# Patient Record
Sex: Female | Born: 1980 | Race: Black or African American | Hispanic: No | Marital: Married | Smoking: Never smoker
Health system: Southern US, Community
[De-identification: ages and names within clinical notes are randomized; demographics above are authoritative.]

## PROBLEM LIST (undated history)

## (undated) ENCOUNTER — Inpatient Hospital Stay (HOSPITAL_COMMUNITY): Payer: Self-pay

## (undated) DIAGNOSIS — M329 Systemic lupus erythematosus, unspecified: Secondary | ICD-10-CM

## (undated) DIAGNOSIS — D649 Anemia, unspecified: Secondary | ICD-10-CM

## (undated) DIAGNOSIS — G43909 Migraine, unspecified, not intractable, without status migrainosus: Secondary | ICD-10-CM

## (undated) DIAGNOSIS — A879 Viral meningitis, unspecified: Secondary | ICD-10-CM

## (undated) DIAGNOSIS — D509 Iron deficiency anemia, unspecified: Secondary | ICD-10-CM

## (undated) DIAGNOSIS — N921 Excessive and frequent menstruation with irregular cycle: Secondary | ICD-10-CM

## (undated) HISTORY — DX: Iron deficiency anemia, unspecified: D50.9

## (undated) HISTORY — DX: Excessive and frequent menstruation with irregular cycle: N92.1

---
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
## 2003-05-21 ENCOUNTER — Inpatient Hospital Stay (HOSPITAL_COMMUNITY): Admission: AD | Admit: 2003-05-21 | Discharge: 2003-05-21 | Payer: Self-pay | Admitting: Obstetrics and Gynecology

## 2003-10-03 ENCOUNTER — Inpatient Hospital Stay (HOSPITAL_COMMUNITY): Admission: EM | Admit: 2003-10-03 | Discharge: 2003-10-05 | Payer: Self-pay | Admitting: Emergency Medicine

## 2004-01-29 ENCOUNTER — Inpatient Hospital Stay (HOSPITAL_COMMUNITY): Admission: AD | Admit: 2004-01-29 | Discharge: 2004-01-30 | Payer: Self-pay | Admitting: Obstetrics & Gynecology

## 2005-03-12 ENCOUNTER — Emergency Department (HOSPITAL_COMMUNITY): Admission: EM | Admit: 2005-03-12 | Discharge: 2005-03-12 | Payer: Self-pay | Admitting: Emergency Medicine

## 2005-04-18 ENCOUNTER — Other Ambulatory Visit: Admission: RE | Admit: 2005-04-18 | Discharge: 2005-04-18 | Payer: Self-pay | Admitting: Obstetrics and Gynecology

## 2005-05-10 ENCOUNTER — Inpatient Hospital Stay (HOSPITAL_COMMUNITY): Admission: EM | Admit: 2005-05-10 | Discharge: 2005-05-13 | Payer: Self-pay | Admitting: Emergency Medicine

## 2005-05-17 ENCOUNTER — Emergency Department (HOSPITAL_COMMUNITY): Admission: EM | Admit: 2005-05-17 | Discharge: 2005-05-18 | Payer: Self-pay | Admitting: Emergency Medicine

## 2005-05-18 ENCOUNTER — Inpatient Hospital Stay (HOSPITAL_COMMUNITY): Admission: AD | Admit: 2005-05-18 | Discharge: 2005-05-22 | Payer: Self-pay | Admitting: Internal Medicine

## 2005-05-18 ENCOUNTER — Ambulatory Visit: Payer: Self-pay | Admitting: Infectious Diseases

## 2005-05-18 ENCOUNTER — Encounter (INDEPENDENT_AMBULATORY_CARE_PROVIDER_SITE_OTHER): Payer: Self-pay | Admitting: *Deleted

## 2006-07-12 IMAGING — CR DG SHOULDER 2+V*L*
3 series · 3 of 3 positions shown · non-contrast
Comparison: None.

CLINICAL DATA: Painful medication line.
 LEFT SHOULDER - 3 VIEW:

[w shoulder ap internal left]
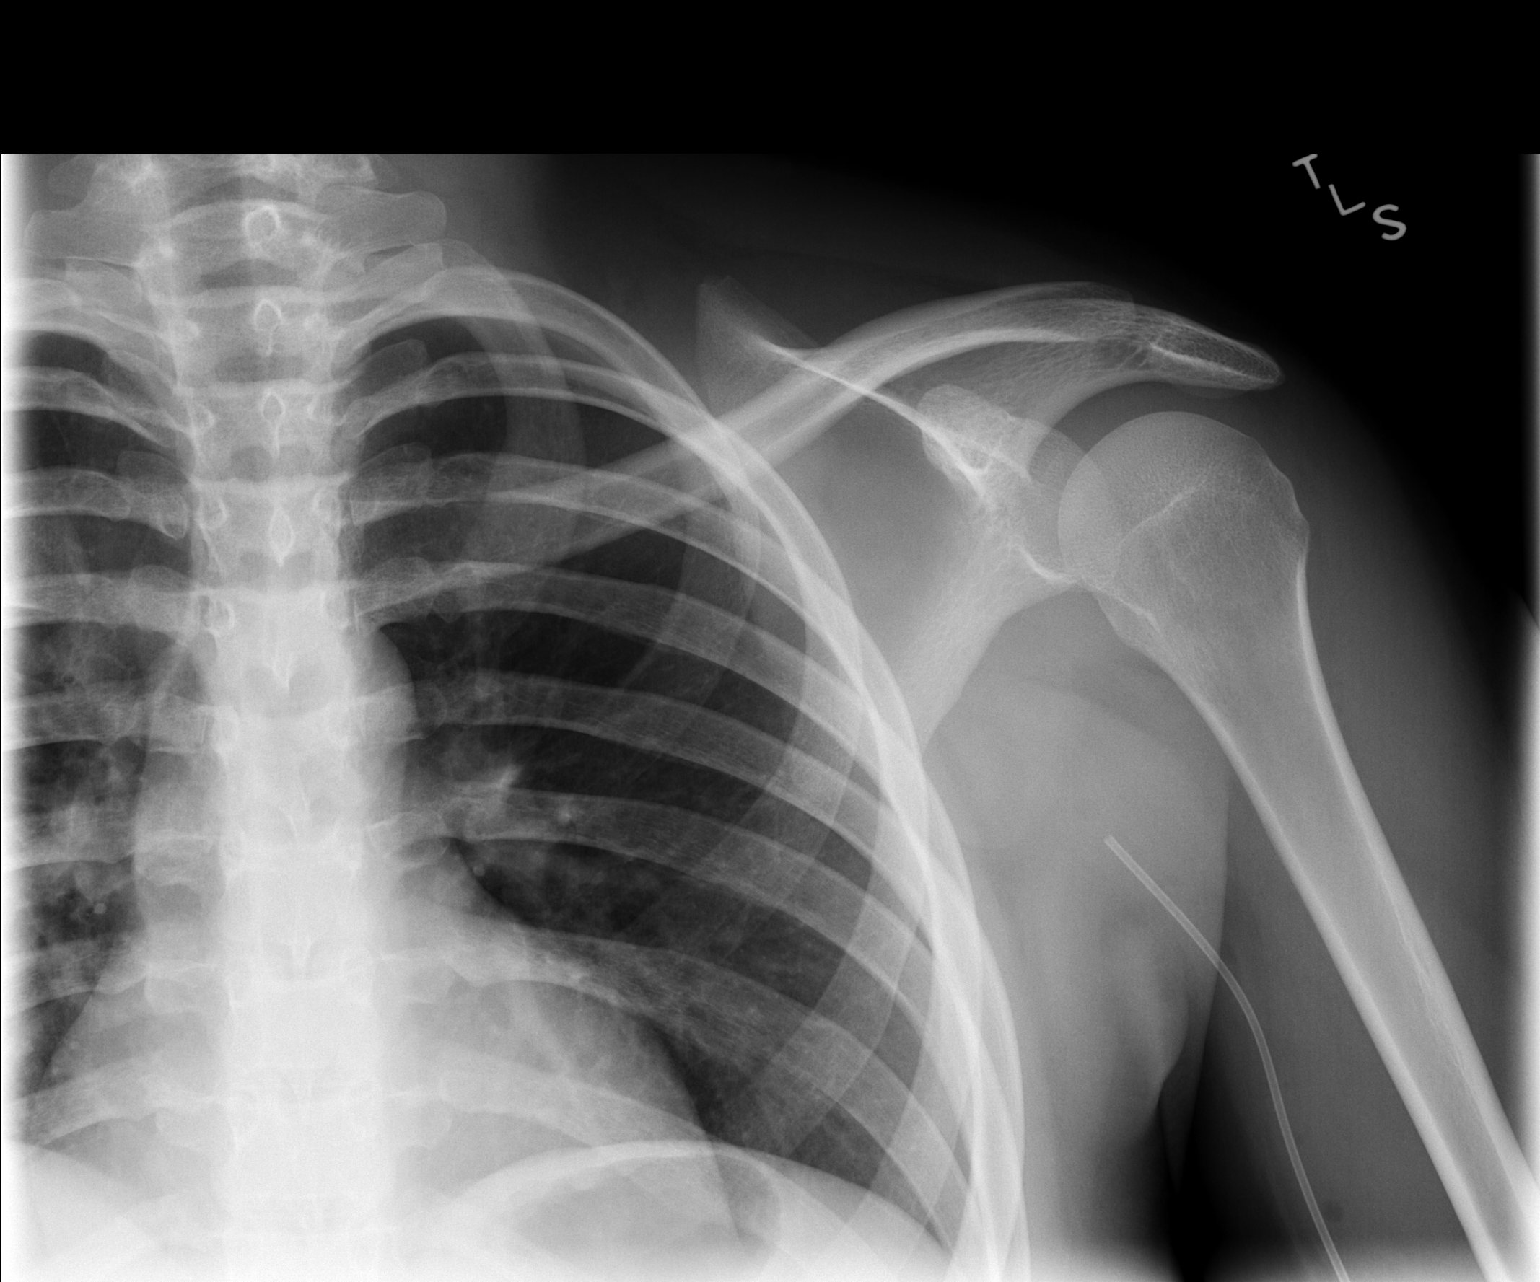

[w shoulder ap external left]
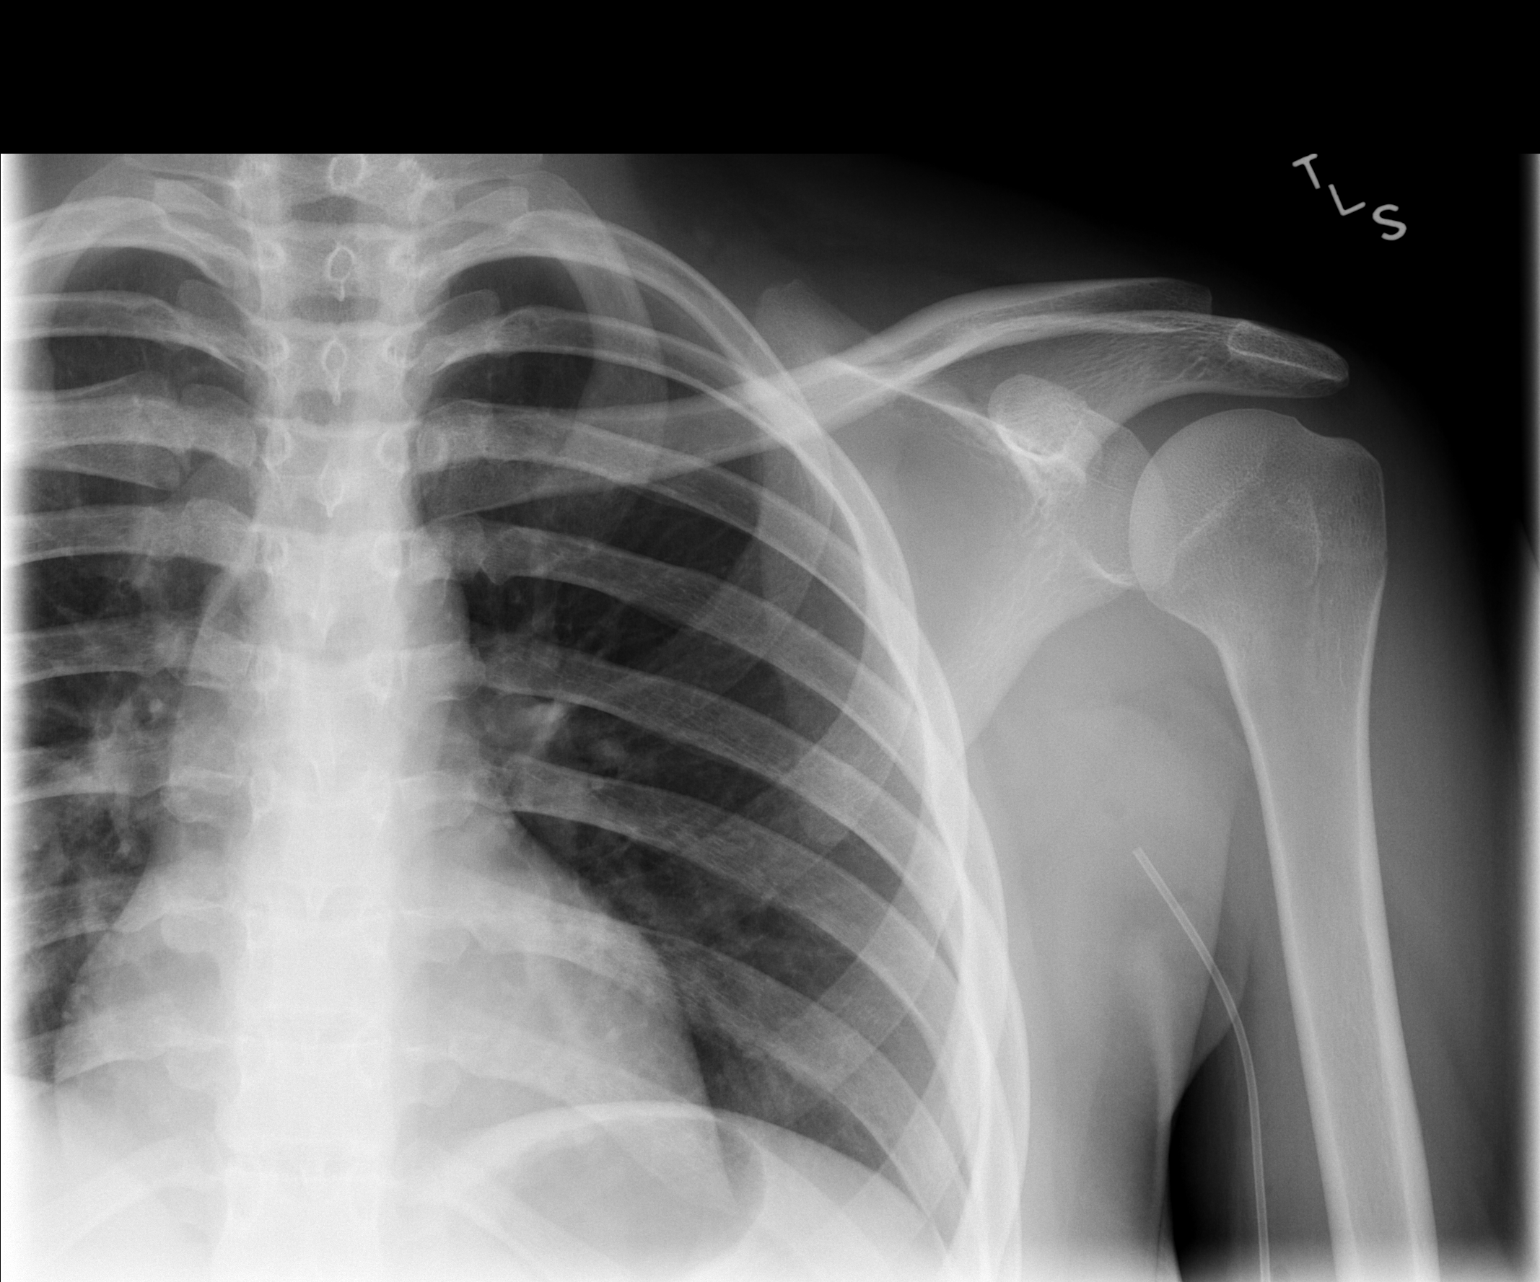

[w shoulder y view left]
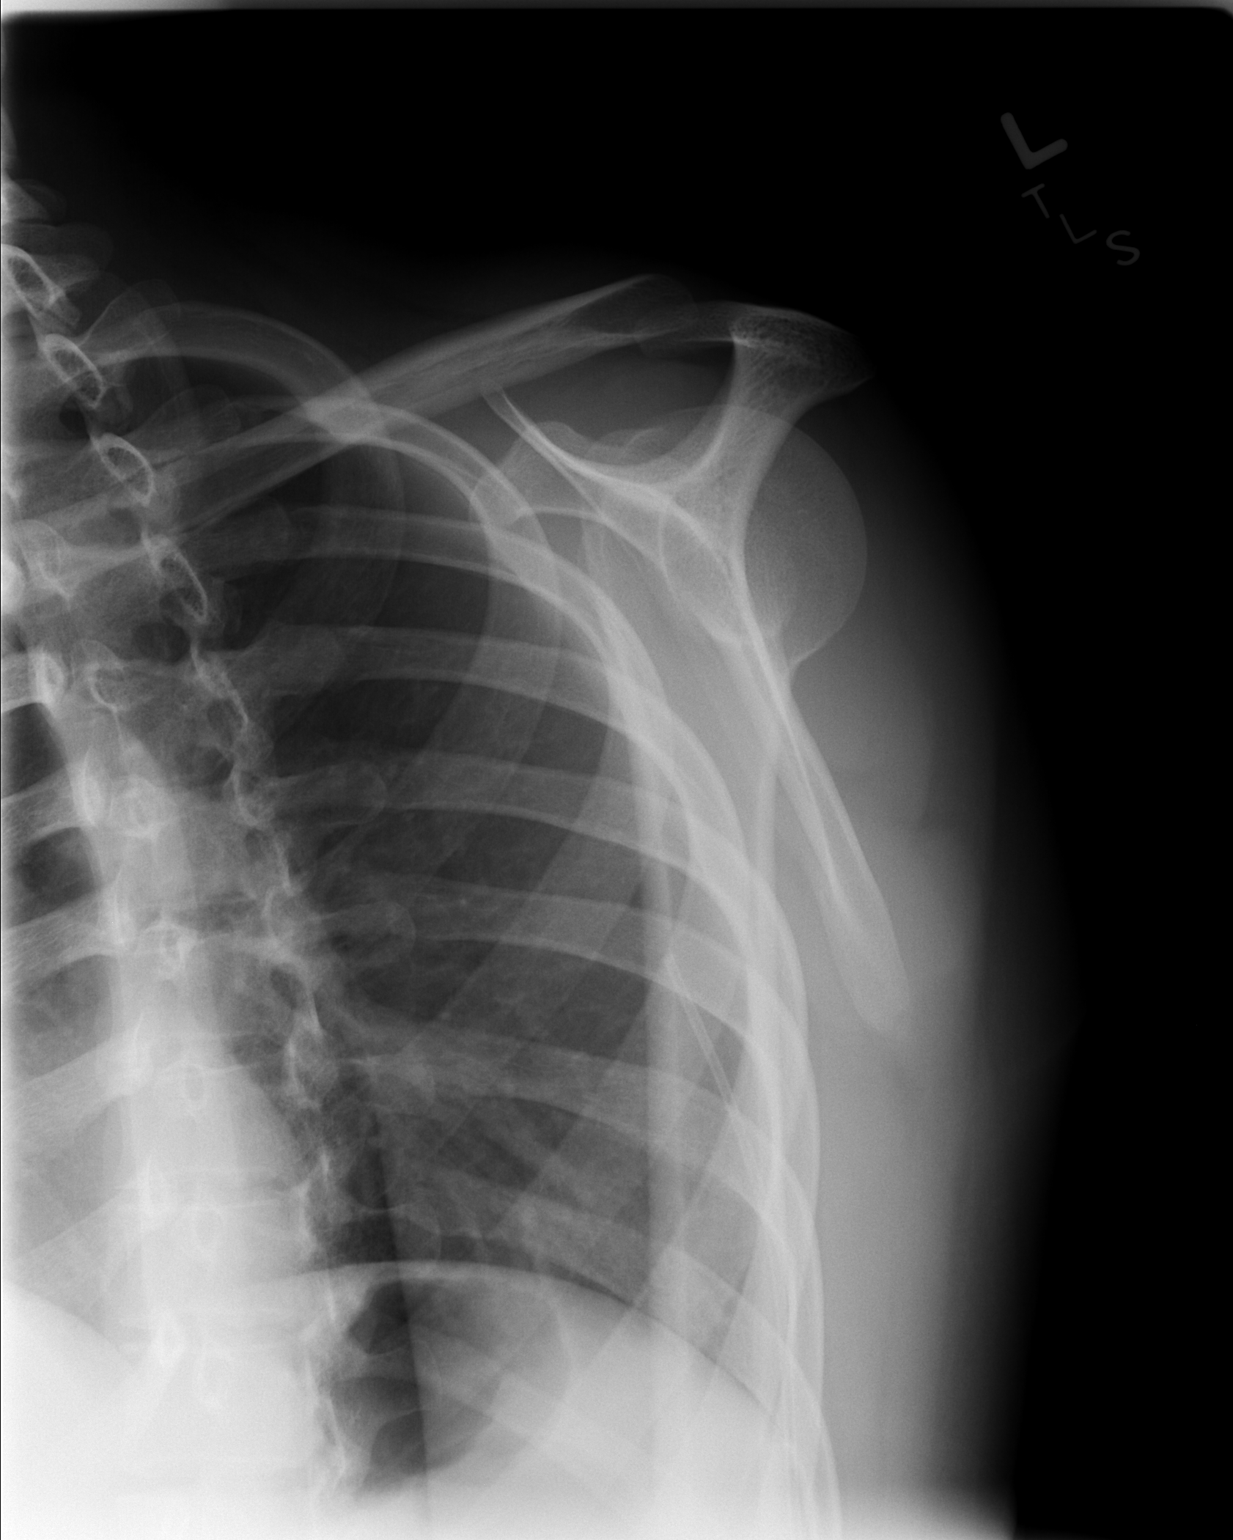

[3 of 3 positions shown; findings below may reference images not displayed]

FINDINGS: Osseous structures appear unremarkable.  Tubing extends along the left arm into the axilla and terminates in the axilla.  On the internal rotation view, there is a suggestion of a small possible locule of gas adjacent to the line.
IMPRESSION: Left-sided tubing extends up the arm and terminates in the axilla.  There may be a tiny locule of gas adjacent to the tubing on one of the images.

## 2006-12-24 ENCOUNTER — Inpatient Hospital Stay (HOSPITAL_COMMUNITY): Admission: EM | Admit: 2006-12-24 | Discharge: 2006-12-27 | Payer: Self-pay | Admitting: Emergency Medicine

## 2006-12-24 ENCOUNTER — Ambulatory Visit: Payer: Self-pay | Admitting: Infectious Diseases

## 2007-04-20 ENCOUNTER — Inpatient Hospital Stay (HOSPITAL_COMMUNITY): Admission: EM | Admit: 2007-04-20 | Discharge: 2007-04-24 | Payer: Self-pay | Admitting: Emergency Medicine

## 2007-04-20 ENCOUNTER — Ambulatory Visit: Payer: Self-pay | Admitting: Infectious Disease

## 2009-01-07 ENCOUNTER — Emergency Department (HOSPITAL_COMMUNITY): Admission: EM | Admit: 2009-01-07 | Discharge: 2009-01-07 | Payer: Self-pay | Admitting: Emergency Medicine

## 2009-06-03 ENCOUNTER — Inpatient Hospital Stay (HOSPITAL_COMMUNITY): Admission: AD | Admit: 2009-06-03 | Discharge: 2009-06-03 | Payer: Self-pay | Admitting: Obstetrics and Gynecology

## 2009-07-01 ENCOUNTER — Ambulatory Visit: Payer: Self-pay | Admitting: Obstetrics and Gynecology

## 2009-07-01 ENCOUNTER — Inpatient Hospital Stay (HOSPITAL_COMMUNITY): Admission: AD | Admit: 2009-07-01 | Discharge: 2009-07-01 | Payer: Self-pay | Admitting: Obstetrics and Gynecology

## 2009-07-27 ENCOUNTER — Inpatient Hospital Stay (HOSPITAL_COMMUNITY): Admission: AD | Admit: 2009-07-27 | Discharge: 2009-07-27 | Payer: Self-pay | Admitting: Obstetrics and Gynecology

## 2009-07-30 ENCOUNTER — Inpatient Hospital Stay (HOSPITAL_COMMUNITY): Admission: AD | Admit: 2009-07-30 | Discharge: 2009-08-01 | Payer: Self-pay | Admitting: Obstetrics & Gynecology

## 2009-12-30 ENCOUNTER — Emergency Department (HOSPITAL_COMMUNITY): Admission: EM | Admit: 2009-12-30 | Discharge: 2009-12-31 | Payer: Self-pay | Admitting: Emergency Medicine

## 2010-07-18 ENCOUNTER — Inpatient Hospital Stay (HOSPITAL_COMMUNITY)
Admission: AD | Admit: 2010-07-18 | Discharge: 2010-07-18 | Payer: Self-pay | Source: Home / Self Care | Attending: Obstetrics & Gynecology | Admitting: Obstetrics & Gynecology

## 2010-07-20 ENCOUNTER — Inpatient Hospital Stay (HOSPITAL_COMMUNITY)
Admission: AD | Admit: 2010-07-20 | Discharge: 2010-07-20 | Payer: Self-pay | Source: Home / Self Care | Attending: Obstetrics & Gynecology | Admitting: Obstetrics & Gynecology

## 2010-07-26 ENCOUNTER — Emergency Department (HOSPITAL_COMMUNITY)
Admission: EM | Admit: 2010-07-26 | Discharge: 2010-07-26 | Payer: Self-pay | Source: Home / Self Care | Admitting: Emergency Medicine

## 2010-10-18 LAB — CBC
HCT: 32.6 % — ABNORMAL LOW (ref 36.0–46.0)
Hemoglobin: 10.6 g/dL — ABNORMAL LOW (ref 12.0–15.0)
MCH: 28.2 pg (ref 26.0–34.0)
MCHC: 32.4 g/dL (ref 30.0–36.0)
MCV: 87.1 fL (ref 78.0–100.0)
Platelets: 219 10*3/uL (ref 150–400)
RBC: 3.75 MIL/uL — ABNORMAL LOW (ref 3.87–5.11)
RDW: 17 % — ABNORMAL HIGH (ref 11.5–15.5)
WBC: 4.9 10*3/uL (ref 4.0–10.5)

## 2010-10-18 LAB — POCT PREGNANCY, URINE: Preg Test, Ur: POSITIVE

## 2010-10-18 LAB — HCG, QUANTITATIVE, PREGNANCY: hCG, Beta Chain, Quant, S: 158 m[IU]/mL — ABNORMAL HIGH (ref <5)

## 2010-10-25 LAB — BASIC METABOLIC PANEL
BUN: 6 mg/dL (ref 6–23)
CO2: 23 mEq/L (ref 19–32)
Calcium: 9 mg/dL (ref 8.4–10.5)
Chloride: 110 mEq/L (ref 96–112)
Creatinine, Ser: 0.69 mg/dL (ref 0.4–1.2)
GFR calc Af Amer: >60 mL/min (ref >60)
GFR calc non Af Amer: >60 mL/min (ref >60)
Glucose, Bld: 90 mg/dL (ref 70–99)
Potassium: 4.1 mEq/L (ref 3.5–5.1)
Sodium: 139 mEq/L (ref 135–145)

## 2010-10-25 LAB — CBC
HCT: 34.9 % — ABNORMAL LOW (ref 36.0–46.0)
Hemoglobin: 11.1 g/dL — ABNORMAL LOW (ref 12.0–15.0)
MCHC: 31.8 g/dL (ref 30.0–36.0)
MCV: 90.1 fL (ref 78.0–100.0)
Platelets: 194 10*3/uL (ref 150–400)
RBC: 3.87 MIL/uL (ref 3.87–5.11)
RDW: 16.1 % — ABNORMAL HIGH (ref 11.5–15.5)
WBC: 4 10*3/uL (ref 4.0–10.5)

## 2010-10-25 LAB — CSF CULTURE W GRAM STAIN
Culture: NO GROWTH
Gram Stain: NONE SEEN

## 2010-10-25 LAB — DIFFERENTIAL
Basophils Absolute: 0 10*3/uL (ref 0.0–0.1)
Basophils Relative: 1 % (ref 0–1)
Eosinophils Absolute: 0.2 10*3/uL (ref 0.0–0.7)
Eosinophils Relative: 6 % — ABNORMAL HIGH (ref 0–5)
Lymphocytes Relative: 34 % (ref 12–46)
Lymphs Abs: 1.4 10*3/uL (ref 0.7–4.0)
Monocytes Absolute: 0.7 10*3/uL (ref 0.1–1.0)
Monocytes Relative: 16 % — ABNORMAL HIGH (ref 3–12)
Neutro Abs: 1.7 10*3/uL (ref 1.7–7.7)
Neutrophils Relative %: 44 % (ref 43–77)

## 2010-10-25 LAB — CSF CELL COUNT WITH DIFFERENTIAL: WBC, CSF: 0 /mm3 (ref 0–5)

## 2010-10-25 LAB — PROTEIN, CSF: Total  Protein, CSF: 25 mg/dL (ref 15–45)

## 2010-10-25 LAB — RAPID STREP SCREEN (MED CTR MEBANE ONLY): Streptococcus, Group A Screen (Direct): NEGATIVE

## 2010-10-25 LAB — VDRL, CSF: VDRL Quant, CSF: NONREACTIVE

## 2010-10-25 LAB — POCT PREGNANCY, URINE: Preg Test, Ur: NEGATIVE

## 2010-11-08 LAB — CBC
HCT: 30.7 % — ABNORMAL LOW (ref 36.0–46.0)
Hemoglobin: 9.7 g/dL — ABNORMAL LOW (ref 12.0–15.0)
MCHC: 31.7 g/dL (ref 30.0–36.0)
MCHC: 31.8 g/dL (ref 30.0–36.0)
MCV: 82.1 fL (ref 78.0–100.0)
Platelets: 211 10*3/uL (ref 150–400)
Platelets: 213 10*3/uL (ref 150–400)
RBC: 3.43 MIL/uL — ABNORMAL LOW (ref 3.87–5.11)
RDW: 21.4 % — ABNORMAL HIGH (ref 11.5–15.5)
RDW: 21.5 % — ABNORMAL HIGH (ref 11.5–15.5)

## 2010-11-10 LAB — URINALYSIS, ROUTINE W REFLEX MICROSCOPIC
Ketones, ur: NEGATIVE mg/dL
Nitrite: NEGATIVE
Protein, ur: NEGATIVE mg/dL
Urobilinogen, UA: 0.2 mg/dL (ref 0.0–1.0)

## 2010-11-11 LAB — CBC
HCT: 24.9 % — ABNORMAL LOW (ref 36.0–46.0)
Platelets: 222 10*3/uL (ref 150–400)
RDW: 17.8 % — ABNORMAL HIGH (ref 11.5–15.5)
WBC: 9.5 10*3/uL (ref 4.0–10.5)

## 2010-11-11 LAB — URINALYSIS, ROUTINE W REFLEX MICROSCOPIC
Bilirubin Urine: NEGATIVE
Nitrite: NEGATIVE
Specific Gravity, Urine: 1.02 (ref 1.005–1.030)
Urobilinogen, UA: 0.2 mg/dL (ref 0.0–1.0)
pH: 7 (ref 5.0–8.0)

## 2010-11-11 LAB — COMPREHENSIVE METABOLIC PANEL
ALT: 11 U/L (ref 0–35)
Albumin: 2.6 g/dL — ABNORMAL LOW (ref 3.5–5.2)
BUN: 4 mg/dL — ABNORMAL LOW (ref 6–23)
Calcium: 8.5 mg/dL (ref 8.4–10.5)
Glucose, Bld: 88 mg/dL (ref 70–99)
Sodium: 136 mEq/L (ref 135–145)
Total Protein: 6.1 g/dL (ref 6.0–8.3)

## 2010-12-21 NOTE — H&P (Signed)
[name], [name]              ACCOUNT NO.:  [id number]   MEDICAL RECORD NO.:  [id number]           PATIENT TYPE:   LOCATION:                                 FACILITY:   PHYSICIAN:  Ladell Pier, M.D.   DATE OF BIRTH:  Jul 22, 1981   DATE OF ADMISSION:  12/24/2006  DATE OF DISCHARGE:                              HISTORY & PHYSICAL   CHIEF COMPLAINT:  Headache and neck stiffness.   HISTORY OF PRESENT ILLNESS:  The patient is a 30 year old African-  American female with past medical history significant for multiple  episodes of viral meningitis.  The patient presented to the ED with  severe headache and neck stiffness for the past 24 hours.  She also  complains of blurry vision.  She has had no nausea or vomiting.  She  states that symptoms are as seen with previous episodes of aseptic  meningitis for which she has had no fevers, but she has had some chills.   PAST MEDICAL HISTORY:  Significant for:  1. HSV meningitis.  2. Mild anemia.   FAMILY HISTORY:  Noncontributory.   SOCIAL HISTORY:  No tobacco or alcohol use.  She has no children.  She  lives with her boyfriend.  She is unemployed.   MEDICATIONS:  Valtrex 500 mg daily.   ALLERGIES:  PENICILLIN.   REVIEW OF SYSTEMS:  As per the HPI.   PHYSICAL EXAMINATION:  VITAL SIGNS: Temperature 98.1, pulse 93,  respirations 20, blood pressure 150/75, 100% on room air.  HEENT:  Normocephalic and atraumatic.  Pupils equal, round, and reactive  to light.  Oropharynx without erythema.  CARDIOVASCULAR: Regular rate and rhythm.  ABDOMEN: Positive bowel sounds.  EXTREMITIES:  Without edema.  NEUROLOGIC:  Exam is nonfocal except she does have limited range of  motion of her neck secondary to neck stiffness and pain.   LABORATORY DATA:  WBC 5.6, hemoglobin 11.1, platelets 291.  Sodium 138,  potassium 3.7, chloride 106, CO2 96, BUN 6, creatinine 0.71, glucose  114.  UA negative.   Head CT negative.   CSF: Glucose 49, protein 44,  wbc's 14, 11% neutrophils, 75% lymphocytes.   ASSESSMENT AND PLAN:  1. Questionable aseptic meningitis: With her history of previous      herpes simplex virus meningitis, will admit her to the hospital,      start her on IV acyclovir, and send CSF for PCR, HIV, and get      infectious disease consult in the morning.  2. Anemia.  This is mild and most likely secondary to menses.  Will      monitor.      Ladell Pier, M.D.  Electronically Signed     NJ/MEDQ  D:  12/24/2006  T:  12/24/2006  Job:  [id number]   cc:   Georgianne Fick, M.D.  Fax: [phone]

## 2010-12-21 NOTE — H&P (Signed)
NAMETERECIA, PLAUT NO.:  [id number]   MEDICAL RECORD NO.:  [id number]          PATIENT TYPE:  EMS   LOCATION:  ED                           FACILITY:  [facility]   PHYSICIAN:  Michaelyn Barter, M.D. DATE OF BIRTH:  1981/03/20   DATE OF ADMISSION:  04/19/2007  DATE OF DISCHARGE:                              HISTORY & PHYSICAL   The patient's primary care doctor is Georgianne Fick, M.D.   CHIEF COMPLAINT:  Severe headache.   HISTORY OF PRESENT ILLNESS:  Ms. Boardman is a 30 year old female who  indicates that she has had at least three episodes of meningitis.  She  states that her current symptoms are the same as her symptoms previously  during which time she was diagnosed with having meningitis.  She  indicates that this past Monday, which is 4 days ago, she developed  diarrhea as well as nausea.  She denies having any emesis.  The next  day, Tuesday, she began to develop a headache.  She took ibuprofen,  which initially decreased the intensity of the headache; however, the  next day, Wednesday, the headache began to intensify and continued  throughout the course of the day.  She took NyQuil Wednesday night and  did not receive any relief.  The following morning, Thursday, she took  Benadryl.  She has taken multiple dosages of Benadryl, which did not  help her symptoms.  Her headache intensity continued to increase and she  developed neck stiffness on Thursday plus some mild photophobia.  Therefore, she came to the hospital for further evaluation.   PAST MEDICAL HISTORY:  1. Recurrent aseptic meningitis.  2. Normocytic anemia.  3. Left upper extremity DVT.  4. Acute renal failure.  5. Herpes simplex virus.  6. Hypokalemia.  7. Intractable nausea and vomiting.   ALLERGIES:  PENICILLIN, reaction is unknown.  LATEX, unknown reaction.   CURRENT MEDICATIONS:  The patient indicates that she ran out of her  Valtrex approximately 3 weeks ago secondary to lack of  insurance.   1. Valtrex.  2. Benadryl.  3. Ibuprofen.   SOCIAL HISTORY:  The patient denies cigarettes.  She denies alcohol.   FAMILY HISTORY:  Mother has no illnesses.  Father is an alcoholic.   REVIEW OF SYSTEMS:  As per HPI.   PHYSICAL EXAM:  The patient is awake.  She is cooperative.  She is in no  obvious respiratory distress.  VITALS:  Her temperature is 99.4, blood pressure 133/97, heart rate 83,  respirations 17, O2 saturation 100% on room air.  HEENT:  Normocephalic, atraumatic.  Anicteric.  Extraocular movements  are intact.  Pupils are equally reactive to light.  Oral mucosa is pink.  No thrush.  No exudate.  NECK:  Positive rigidity.  No lymphadenopathy.  No thyromegaly.  CARDIAC:  S1-S2 present, regular rate and rhythm.  No murmurs.  No  gallops.  No rubs.  RESPIRATORY:  No crackles or wheezes.  ABDOMEN:  Flat, soft, some slight discomfort in all four quadrants.  No  rebound.  No guarding.  Positive bowel sounds.  No masses palpated.  EXTREMITIES:  No leg edema.  NEUROLOGIC:  The patient is alert and oriented x3.  Cranial nerves II-  XII are intact.  MUSCULOSKELETAL:  5/5 upper and lower extremity strength.   LABS:  CSF culture:  Predominantly mononuclear gram-negative rods.  White blood cell count 129, lymphocytes 87.  Glucose CSF 48 and total  protein CSF 45.   ASSESSMENT AND PLAN:  1. Aseptic meningitis.  Given the patient's past history of herpes      simplex virus-related meningitis, I suspect that this may be again      secondary to herpes simplex virus.  Will start the patient on IV      acyclovir and will consider consultation with infectious disease.      Will also provide the patient with p.r.n. pain medication.  2. Nausea.  Will provide Zofran on a p.r.n. basis.  3. Gastrointestinal prophylaxis.  Will provide Protonix.      Michaelyn Barter, M.D.  Electronically Signed     OR/MEDQ  D:  04/20/2007  T:  04/20/2007  Job:  [id number]   cc:   Georgianne Fick, M.D.  Fax: [phone]

## 2010-12-21 NOTE — Discharge Summary (Signed)
NAMEJAYDYN, MENON NO.:  [id number]   MEDICAL RECORD NO.:  [id number]          PATIENT TYPE:  INP   LOCATION:  1539                         FACILITY:  [facility]   PHYSICIAN:  Hillery Aldo, M.D.   DATE OF BIRTH:  18-Aug-1980   DATE OF ADMISSION:  12/23/2006  DATE OF DISCHARGE:  12/27/2006                               DISCHARGE SUMMARY   Primary care physician is Georgianne Fick, M.D.   DISCHARGE DIAGNOSES:  1. Recurrent aseptic meningitis.  2. Normocytic anemia, mild.  3. Headaches secondary to #1.   DISCHARGE MEDICATIONS:  1. Valtrex 500 mg daily.  2. Benadryl 25 mg q.4h. p.r.n.  3. Tylenol 650 mg q.4h. p.r.n.  4. Vicodin 5/325 mg 1-2 tablets q.6h. p.r.n. pain not controlled by      Tylenol alone (#20 dispensed).   CONSULTATIONS:  Rockey Situ. Roxan Hockey, M.D., of infectious diseases.   BRIEF ADMISSION HISTORY OF PRESENT ILLNESS:  The patient is a 30-year-  old female with a past medical history of viral meningitis x2, who  presented to the emergency department with severe headaches and neck  stiffness, accompanied by blurry vision.  She denied any nausea or  vomiting.  She was admitted for further evaluation and workup.   PROCEDURES AND DIAGNOSTIC STUDIES:  1. CT scan of the head on Dec 23, 2006, showed no acute intracranial      abnormalities  2. Lumbar puncture with cerebrospinal fluid analysis done on Dec 23, 2006.   DISCHARGE LABORATORY VALUES:  CSF cultures were negative.  HSV PCR was  negative.  Cryptococcal antigen studies were negative.  VDRL studies  were negative.   HOSPITAL COURSE:  Problem 1.  RECURRENT ASEPTIC MENINGITIS:  The patient  was seen in consultation with the infectious disease specialist and  empirically put on IV acyclovir.  She also received empiric treatment  with Rocephin until culture data and cell counts were obtained.  Also in  the differential was NSAID-induced meningitis, and she was instructed to  avoid all use  of NSAIDs in the future.  Once culture data was back and  was found to be negative, the patient was deemed stable for discharge  home.  She will continue on her suppressive therapy of Valtrex on  discharge.   Problem 2.  ANEMIA:  The patient does have a mild normocytic anemia with  an admission hemoglobin of 11.  No further diagnostic workup was  undertaken as she is a young menstruating female.   Problem 3.  HEADACHES:  The patient's headaches were treated with  oxycodone and Dilaudid for severe headache pain.  Her headache pain has  been controlled and we will discharge her on a limited amount of Vicodin  to use for pain not relieved by Tylenol alone.  Again, she is instructed  to avoid all use of Motrin in the future.      Hillery Aldo, M.D.  Electronically Signed     CR/MEDQ  D:  12/27/2006  T:  12/27/2006  Job:  [id number]   cc:   Georgianne Fick, M.D.  Fax: [phone]

## 2010-12-21 NOTE — Discharge Summary (Signed)
[name], [name]              ACCOUNT NO.:  [id number]   MEDICAL RECORD NO.:  [id number]          PATIENT TYPE:  INP   LOCATION:  1441                         FACILITY:  [facility]   PHYSICIAN:  Wilson Singer, M.D.DATE OF BIRTH:  June 09, 1981   DATE OF ADMISSION:  04/19/2007  DATE OF DISCHARGE:  04/24/2007                               DISCHARGE SUMMARY   FINAL DISCHARGE DIAGNOSES:  1. Aseptic meningitis.  2. Normocytic anemia.   MEDICATIONS ON DISCHARGE:  Valtrex 1 gram t.i.d. for 10 days followed by  Valtrex 500 mg b.i.d. prophylactically.  Tramadol 500 mg b.i.d. p.r.n. for pain.   CONDITION ON DISCHARGE:  Stable.   HISTORY:  This very pleasant 30 year old lady was admitted once again  with symptoms and signs of meningitis.  This would be her fourth  admission for the same problem.  Please see initial history and physical  examination by Dr. Michaelyn Barter.   HOSPITAL PROGRESS:  The patient was admitted and empirically put on  intravenous antibiotics for bacterial meningitis as well as intravenous  acyclovir for presumed herpes simplex meningitis.  Initial gram stain in  the CSF was suggesting gram negative rods but culture did not grow any  organism.  Blood cultures did not grow any organisms either.  The  patient, in fact, continued to improve with the regimen of medications  given.  She underwent MRI of the brain as well as part of the spinal  cord and there was no abscess or any other lesions seen.  She progressed  subsequently with improvement with minimal headache and minimal  meningism.  A PBD test was applied and this was negative.  She was seen  by Dr. Daiva Eves, Infectious Disease, who agreed with the assessment and  did not feel that the gram negative rods seen on gram stain were  anything of any significance.  He recommended continuing acyclovir  intravenously and then convert to p.o. Valtrex.  On converting to p.o.  Valtrex prior to the day of discharge she has  done well.   On physical examination today temperature 97.7, blood pressure 95/57,  pulse 71, saturation 98% on room air.  There is no meningism.  She is  alert and orientated with no focal neurological signs.   Investigations today show a hemoglobin of 10.7, white blood cell count  5.3, platelets 257, sodium 140, potassium 3.5, bicarbonate 27, BUN 5,  creatinine 0.65.   FURTHER DISPOSITION:  The patient will finish the course of Valtrex and  then continue with a prophylactic dose.  I have told her to go and see  her primary care physician as soon as possible so that she may receive  some samples of these medications as she has no health insurance.      Wilson Singer, M.D.  Electronically Signed     NCG/MEDQ  D:  04/24/2007  T:  04/24/2007  Job:  [id number]   cc:   Georgianne Fick, M.D.  Fax: [phone]

## 2010-12-24 NOTE — H&P (Signed)
NAMEADAIAH, JASKOT NO.:  [id number]   MEDICAL RECORD NO.:  [id number]          PATIENT TYPE:  EMS   LOCATION:  ED                           FACILITY:  [facility]   PHYSICIAN:  Lonia Blood, M.D.       DATE OF BIRTH:  [birth year]   DATE OF ADMISSION:  05/17/2005  DATE OF DISCHARGE:                                HISTORY & PHYSICAL   PRIMARY CARE PHYSICIAN:  Georgianne Fick, M.D.   CHIEF COMPLAINT:  Nausea and vomiting.   HISTORY OF PRESENT ILLNESS:  Mr. Bienaime is a 30 year old African-American  woman with history of recurrent HSV meningitis who was admitted here at  [facility] from   NO FURTHER DICTATION      Lonia Blood, M.D.  Electronically Signed     SL/MEDQ  D:  05/18/2005  T:  05/18/2005  Job:  [id number]

## 2010-12-24 NOTE — Discharge Summary (Signed)
[name], [name]              ACCOUNT NO.:  [id number]   MEDICAL RECORD NO.:  [id number]          PATIENT TYPE:  INP   LOCATION:  6733                         FACILITY:  [facility]   PHYSICIAN:  Lonia Blood, M.D.      DATE OF BIRTH:  1981/06/21   DATE OF ADMISSION:  05/18/2005  DATE OF DISCHARGE:  05/22/2005                                 DISCHARGE SUMMARY   PRIMARY CARE PHYSICIAN:  Georgianne Fick, M.D.   DISCHARGE DIAGNOSES:  1.  Intractable nausea and vomiting.  2.  Left upper extremity deep venous thrombosis.  3.  Acute renal failure.  4.  Recent herpes simplex virus.  5.  Hypokalemia.   DISCHARGE MEDICATIONS:  1.  Coumadin 5 mg daily at night, to be adjusted as an outpatient.  2.  Valtrex 500 mg daily.   DISPOSITION:  The patient is currently in good health.  Her INR is currently  therapeutic at 2.0.  She is going to continue to be on Coumadin for the next  three months for her left upper extremity deep venous thrombosis.   FOLLOW UP:  She will subsequently follow up with Dr. Nicholos Johns for  further management.   PROCEDURES PERFORMED:  1.  Left upper extremity Dopplers performed on May 18, 2005 showing DVT      in the axillary vein and superficial thrombus in the basilic vein.      Limited evaluation due to bandages on the arm.  2.  Left shoulder x-ray showing left-sided tubing extending up to the arm      and terminated in the axilla.  May be a tiny locule of gas in the tubing      on one of the images.   CONSULTATIONS:  None.   HISTORY OF PRESENT ILLNESS:  Please refer to the dictated history and  physical on admission by Dr. Lonia Blood.  In short, however, this patient is  a 30 year old African American female presenting with intractable nausea and  vomiting.  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  She came back this time around with low-grade  fever as well as  intractable nausea and vomiting a couple of days later.  The patient was  also found to be profoundly dehydrated and in acute renal failure, with a  creatinine of 6.1, BUN 32.  She was subsequently admitted for management of  acute renal failure and dehydration.   HOSPITAL COURSE:  Problem 1.  Intractable nausea and vomiting.  The patient  was admitted and kept n.p.o.  She was started on IV Phenergan and Zofran.  Her nausea and vomiting was controlled within 24 hours of admission.  She,  however, continued to have problems taking p.o. medications up until the  third day.  She is currently fine and able to eat and drink without a  problem.   Problem 2.  Acute renal failure.  That was thought to be prerenal from  intractable nausea, vomiting, and decreased intake.  The patient had IV  normal saline  boluses as well as continuously.  Her BUN and creatinine has  steadily improved.  Today, it is 1.3 and has been 1.3 for the past two to  three days which means that it is probably her stable baseline.   Problem 3.  Hypokalemia.  Her potassium briefly dropped to 3.1.  She  received potassium supplementation.  Magnesium was also checked which was  normal.  It was thought to be secondary to her nausea and vomiting.  She had  workup therefore for nausea and vomiting, including checking her amylase and  lipase which were all normal.  So far, her potassium has resolved now on and  is 3.9.   Problem 4.  HSV meningitis.  When the patient presented with nausea and  vomiting, her acyclovir was discontinued per Dr. Orvan Falconer.  She is currently  on Valtrex and will continue to take Valtrex probably indefinitely.   DISCHARGE LABORATORY DATA:  White count 5.0, hemoglobin 10.5, platelets 288.  Sodium 142, potassium 3.6, chloride 109, CO2 22, BUN 6, creatinine 1.3,  glucose 109, calcium 8.7.  PT 22.5, INR 2.0.      Lonia Blood, M.D.  Electronically Signed     LG/MEDQ  D:   05/22/2005  T:  05/22/2005  Job:  [id number]

## 2010-12-24 NOTE — Discharge Summary (Signed)
NAMESOLE, LENGACHER              ACCOUNT NO.:  [id number]   MEDICAL RECORD NO.:  [id number]          PATIENT TYPE:  INP   LOCATION:  1603                         FACILITY:  [facility]   PHYSICIAN:  Lonia Blood, M.D.      DATE OF BIRTH:  1981-04-09   DATE OF ADMISSION:  05/09/2005  DATE OF DISCHARGE:  05/13/2005                                 DISCHARGE SUMMARY   PRIMARY CARE PHYSICIAN:  The patient is unassigned to [place].  The patient  follows with Dr. Nicholos Johns.   DISCHARGE DIAGNOSES:  1.  Aseptic meningitis secondary to herpes simplex virus, seems to be      recurrent.  2.  Altered mental status secondary to aseptic meningitis secondary to      herpes simplex virus.  3.  Severe headache secondary aseptic meningitis secondary to herpes simplex      virus.   DISCHARGE MEDICATIONS:  1.  Acyclovir 630 mg IV q.8h. for 6 days.  2.  Valtrex 500 mg p.o. daily, probably indefinite.   DISPOSITION:  Patient is currently stable.  No headache.  Patient also has  no altered mental status at this point.   PROCEDURES PERFORMED:  1.  Head CT without contrast performed on May 09, 2005 showed negative.  2.  Lumbar puncture under fluoro performed on May 09, 2005 that was      consistent with viral meningitis.   CONSULTATIONS:  Dr. Enedina Finner, infectious disease.   HISTORY OF PRESENT ILLNESS:  Please refer to dictated HPI.  In short,  however, this is a 30 year old African-American female with a history of  previous meningitis last year presenting with severe headache for 3 days.  The patient was seen in the ED with fever, slightly altered mental status  and severe headache.  She subsequently had an LP performed that was  consistent with aseptic meningitis.  The patient was admitted for management  of her meningitis presumed to be aseptic rather than bacterial.   HOSPITAL COURSE:  The patient was started on IV Rocephin and also vancomycin  empirically.  Her CSF was sent out, and came  back with glucose of 39, total  protein of 46.  Her cell count was 84, mostly lymphocytes.  Gram stain was  also negative.  HSB-PCR came back as positive.  Her HIV antibody was negative also, and  culture was mainly mononuclear cells.  Dr. Ninetta Lights was consulted for further  management.  Recurrent Herpes Meningitis was diagnosed based on the fact  that the patient has had this twice, 2 years apart. 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  With this in mind, the patient will continue to probably be on  Valtrex suppression for a long time to come.  Meanwhile, her acyclovir will  be continued for a total of 10 days.      Lonia Blood, M.D.  Electronically Signed     LG/MEDQ  D:  05/13/2005  T:  05/13/2005  Job:  [id number]   cc:   Georgianne Fick, M.D.  Fax: [phone]

## 2010-12-24 NOTE — H&P (Signed)
Anita, [name]              ACCOUNT NO.:  [id number]   MEDICAL RECORD NO.:  [id number]          PATIENT TYPE:  INP   LOCATION:  1603                         FACILITY:  [facility]   PHYSICIAN:  Lonia Blood, M.D.       DATE OF BIRTH:  [birth year]   DATE OF ADMISSION:  05/10/2005  DATE OF DISCHARGE:                                HISTORY & PHYSICAL   CHIEF COMPLAINT:  Headache.   HISTORY OF PRESENT ILLNESS:  [name] is a 30 year old African-American  woman with a past medical history of meningitis a year ago.  She reported a  severe headache for the past 3 days; she just could not get over it.  She  went to her primary care physician today who sent her to the emergency room.  In the emergency room, she had a lumbar puncture which revealed increased  white blood cells, decreased glucose.  Currently, [name] has a less  severe headache after a dose of narcotics, and she reports no nausea, no  vomiting, and no photophobia.   PAST MEDICAL HISTORY:  No previous surgeries.  No previous pregnancies.   FAMILY HISTORY:  Noncontributory.   SOCIAL HISTORY:  Denies any tobacco use.  Denies any alcohol use.   DRUG ALLERGIES:  PENICILLIN.   MEDICATIONS:  She does not take any medications.   REVIEW OF SYSTEMS:  Negative; 12 systems tested.   PHYSICAL EXAMINATION:  VITAL SIGNS:  Temperature 98.7, pulse 84,  respirations 20, blood pressure 116/76.  GENERAL APPEARANCE:  She is mildly obese.  In no acute distress.  HEENT:  Eyes - pupils equal, round and reactive to light and accommodation.  Extraocular movements are intact.  Fundi unremarkable.  NECK:  No JVD, no carotid bruits.  LUNGS:  Clear to auscultation bilaterally, without wheezes, rhonchi, or  crackles.  HEART:  Regular rate and rhythm without murmurs, rubs, or gallops.  ABDOMEN:  Soft, nontender, nondistended.  Bowel sounds are present.  EXTREMITIES:  Without edema.  SKIN:  Warm and dry.  NECK:  Mild stiffness.  Brudzinski's  negative.  Kernig is negative.  NEUROLOGIC:  Cranial nerves II-XII are intact.  There were 5/5 strength in  all 4 extremities.  Deep tendon reflexes symmetric.  Sensation is intact.   LABORATORY FINDINGS:  There were 84 white blood cells in the CSF with 89%  lymphocytes.  Glucose of 39, and protein of 46.   ASSESSMENT AND PLAN:  Aseptic meningitis.  Differential includes viral,  given that the patient had some diarrhea recently.  Echovirus versus  enterovirus versus a Coxsackie virus.  Other possibilities would be syphilis  or  tuberculosis, but they are less likely.  The patient recently was tested for  HIV, and she was negative.  The plan is to start empiric antibiotics and  obtain ID consultation in the morning.  Admit the patient to the hospital  for observation, and treat her headaches empirically with opiates.      Lonia Blood, M.D.  Electronically Signed     SL/MEDQ  D:  05/10/2005  T:  05/10/2005  Job:  [id number]  cc:   [name], M.D.  Fax: [phone]

## 2010-12-24 NOTE — Discharge Summary (Signed)
NAME:  Anita, [name] NO.:  [id number]   MEDICAL RECORD NO.:  [id number]                   PATIENT TYPE:  INP   LOCATION:  0476                                 FACILITY:  [facility]   PHYSICIAN:  Sherin Quarry, MD                   DATE OF BIRTH:  1981/05/20   DATE OF ADMISSION:  10/02/2003  DATE OF DISCHARGE:  10/05/2003                                 DISCHARGE SUMMARY   HOSPITAL COURSE:  [name] is a 30 year old woman with no significant  past medical history who initially presented to the [facility] Emergency  Room on February 24th with onset of illness approximately Monday that began  with a headache and body aches.  The patient was initially treated for strep  throat with Biaxin and Allegra without improvement.  On presentation to the  emergency room, she was noted to be febrile and to have evidence of  meningismus.  A lumbar puncture was performed in the emergency room which  showed evidence of low glucose, elevated protein, and elevated white blood  cells with predominance of lymphocytes.  The patient was therefore admitted  with presumptive diagnosis of meningitis probably viral.   Physical exam at the time of admission as described by Dr. Nehemiah Settle:  Temperature was 99.1, blood pressure 114/79, pulse 85, respirations 20.  HEENT exam was remarkable for injected sclerae.  The neck was somewhat sore  to palpation but there was no meningismus.  The chest was clear.  Cardiovascular exam revealed normal S1 & S2 without rubs, murmurs, or  gallops.  The abdomen was benign.  There were normal bowel sounds without  masses, tenderness or organomegaly.  Neurologic testing and examination of  extremities was normal.   The relevant laboratory studies included a white count of 4100, hemoglobin  was 11.7, hematocrit 35.4.  Differential was remarkable for increased number  of lymphocytes and monocytes.  The CMET was notable for normal liver  profile.  CSF  studies showed 348 white blood cells in tube #4, 98% of which  were lymphocytes.  The protein was 64, glucose was 54.  Subsequently, a gram  stain was negative.  Blood cultures obtained in a serial fashion were  negative and the CSF culture was negative.  Monospot was negative.   On admission, the patient was placed on an empiric basis on Rocephin 2 grams  IV every 12 hours pending the results of her spinal fluid examination.  By  February 27th, it was felt reasonable to discontinue intravenous antibiotics  as the spinal fluid cultures had remained negative.  The patient was offered  the option of remaining in the hospital or going home and elected to request  discharge.  Therefore on February 27th she was discharged.   DISCHARGE DIAGNOSES:  Aseptic meningitis presumably of viral origin.   DISCHARGE MEDICATIONS:  Will consist of Tylox p.r.n. for pain, Motrin 800 mg  t.i.d.  I also offered her Ultram p.r.n. for pain as an alternative to the  Tylox.  I emphasized to the patient the importance of follow up visits to  assess her status.  She agreed to follow up with the [facility].   CONDITION AT TIME OF DISCHARGE:  Good.                                               Sherin Quarry, MD   SY/MEDQ  D:  10/05/2003  T:  10/05/2003  Job:  [id number]   cc:   [facility]

## 2010-12-24 NOTE — H&P (Signed)
NAME:  [name], [name]                        ACCOUNT NO.:  [id number]   MEDICAL RECORD NO.:  [id number]                   PATIENT TYPE:  EMS   LOCATION:  ED                                   FACILITY:  [facility]   PHYSICIAN:  Deirdre Peer. Polite, M.D.              DATE OF BIRTH:  1981/02/03   DATE OF ADMISSION:  10/02/2003  DATE OF DISCHARGE:                                HISTORY & PHYSICAL   CHIEF COMPLAINT:  Headache, nausea, vomiting.   HISTORY OF PRESENT ILLNESS:  [name] is a 30 year old female with no  significant past medical history, who presents to the ED for evaluation of  headache, nausea, and vomiting.  Of note, the patient has been having  trouble since Monday with headache and body aches.  The patient was seen by  her primary MD and told that she had strep throat with pus on her tonsils.  The patient was treated with Biaxin and Allegra x 2 days without  improvement.  The patient presented to the ED for evaluation and found to be  febrile with neck stiffness.  The patient also has been having nausea and  vomiting since then, photophobia and, as stated, neck stiffness.  The  patient underwent an LT in the ED, labs consistent with low glucose and  elevated protein with elevated white blood cells consistent with meningitis,  possibly of a viral nature as there is lymphocyte predominance.  [facility]  Hospitalist has been called for further evaluation and treatment.   PAST MEDICAL HISTORY:  No significant problems.   MEDICATIONS:  None.  The patient was on birth control approximately 1 month  ago but has stopped.  Prior to that, had been taking Nordette.   SOCIAL HISTORY:  Negative for tobacco, alcohol, or drugs.   PAST SURGICAL HISTORY:  Negative.   ALLERGIES:  The patient described reaction to PENICILLIN which causes rash.   FAMILY HISTORY:  Noncontributory.   REVIEW OF SYSTEMS:  As stated above, significant for photophobia, neck  stiffness, fever, nausea, vomiting.  No  chest pain and no shortness of  breath.  Mild abdominal discomfort, most likely secondary to the nausea and  vomiting.  The patient denies any dysuria.   PHYSICAL EXAMINATION:  GENERAL:  The patient is alert and oriented x 3,  mildly toxic in appearance.  VITAL SIGNS:  Temp. 99.1, BP 114/79, pulse of 85, respiratory rate 20.  HEENT:  Significant for injected sclerae.  The patient does have a white  patch on left tonsils.  No tonsillar erythema.  NECK:  Without adenopathy, painful to palpation, although supple.  CHEST:  Clear to auscultation bilaterally.  CARDIOVASCULAR:  Regular.  No S3.  ABDOMEN:  Soft, nontender, positive bowel sounds, no hepatosplenomegaly.  EXTREMITIES:  No cyanosis, clubbing, or edema.   DATA:  CBC:  Significant for a white count of 3.3, hemoglobin 13.1, platelet  262.  BMET significant for hypokalemia  with potassium of 3.0.  AST and ALT  within normal limits.  Bilirubin 0.8.  Mono screen is negative.  Group A  strep screen is negative.  CSF:  Glucose 54, total protein 64, 2 __________,  3 red blood cells, 259 white blood cells, 98% lymphs, 240 red blood cells,  348 white blood cells, __________ 98% lymphocyte predominance.  The patient  had a CT of the head within normal limits.   ASSESSMENT AND PLAN:  Meningitis, probably viral in nature as CSF has  lymphocyte predominance.  1. Nausea and vomiting.  2. Headache.  3. Fever.  4. Questionable strep throat, although strep test in the emergency     department is negative.   Recommend patient be admitted to a medicine floor bed, given IV fluids, IV  antibiotics, and analgesic.  The patient does state a history of PENICILLIN  allergy; however, the patient has already received Rocephin in the emergency  department.  We will continue for now.  If problems, consider changing  antibiotics.  We will await cultures and CSF.  Disposition pending CSF  culture.                                               Deirdre Peer.  Polite, M.D.    RDP/MEDQ  D:  10/02/2003  T:  10/03/2003  Job:  [id number]

## 2010-12-24 NOTE — H&P (Signed)
[name], [name] NO.:  [id number]   MEDICAL RECORD NO.:  [id number]          PATIENT TYPE:  EMS   LOCATION:  ED                           FACILITY:  [facility]   PHYSICIAN:  Lonia Blood, M.D.       DATE OF BIRTH:  01/05/1981   DATE OF ADMISSION:  05/17/2005  DATE OF DISCHARGE:                                HISTORY & PHYSICAL   PRIMARY CARE PHYSICIAN:  Georgianne Fick, M.D.   CHIEF COMPLAINT:  Nausea and vomiting.   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  She was hospitalized at [facility] from May 08, 2005 until May 13, 2005.  During that period, she was kept on intravenous acyclovir.  On  May 13, 2005, she was discharged home with a PICC line in her left upper  extremity, to continue with intravenous acyclovir at home.  The patient  reports that after getting home she had constant nausea and vomiting, a low-  grade temperature, and a very painful IV site.  She was seen by per primary  care physician yesterday, and she was given intravenous fluids.  She  continued to experience nausea and vomiting to the point where she was  unable to keep anything down, so she came to the emergency room.   PAST MEDICAL HISTORY:  HSV meningitis.   FAMILY HISTORY:  Noncontributory.   SOCIAL HISTORY:  The patient does not drink alcohol.  She does not use any  tobacco.  She lives with her boyfriend here in [place].   DRUG ALLERGIES:  She is allergic to PENICILLIN.   HOME MEDICATIONS:  Acyclovir intravenously.   REVIEW OF SYSTEMS:  Negative for headaches.  Negative for stiff neck.  Negative for blurred vision.  Negative for double vision.  Negative for eye  pain.  Negative for sore throat.  Negative for chest pain.  Negative for  shortness of breath.  No abdominal pain.  Positive nausea and vomiting.  No  dysuria.  Positive left upper extremity pain at  the site of the PICC line.   PHYSICAL EXAMINATION:  VITAL SIGNS:  Upon admission, temperature was 100.2,  pulse 70, respirations 20, blood pressure 133/87, saturation 100% on room  air.  GENERAL:  The patient appears in no acute distress, sitting in bed, alert  and oriented to place, person, and time.  In no acute discomfort.  HEENT:  Mouth is clear without any ulcerations.  Throat is clear.  NECK:  Supple without JVD.  No carotid bruits.  LUNGS:  Clear to auscultation bilaterally without wheezes, rhonchi, or  crackles.  HEART:  Regular rate and rhythm without murmurs, rubs, or gallops.  ABDOMEN:  Soft, nontender, nondistended.  Bowel sounds are present.  No  hepatosplenomegaly.  EXTREMITIES:  Left upper extremity is very tender at the site of the PICC  line insertion.  There is no appreciable rash of the skin.  NEUROLOGIC:  Cranial nerves III-XII are intact.  There is 5/5 strength in  all 4 extremities.  The patient  is walking without difficulty.  Cerebellar  is intact.  Sensation is intact.  Speech, memory, and affect were intact.   LABORATORY DATA:  The laboratory values at the time of admission showed a  white blood cell count of 8.6, hemoglobin of 10.8, hematocrit 32.4, platelet  count 267.  A sodium of 134, potassium 3.7, chloride 103, bicarbonate 23,  glucose 106, BUN 32, creatinine 6.1, calcium 8.2.   ASSESSMENT AND PLAN:  1.  Acute renal failure, most likely related to dehydration and acyclovir      use.  The plan is to stop the acyclovir and start gentle IV fluid      hydration.  2.  Recurrent herpes simplex virus meningitis.  We will stop the acyclovir      and consult ID for suggestions about what will be safe to use at this      point.  3.  Pain of the left upper extremity at the insertion of the PICC site.  We      will discontinue the PICC line, and proceed with an ultrasound of the      left upper extremity to rule out a deep vein thrombosis.  4.  Fever.  Will  proceed with blood cultures, UA, and monitor the patient      closely with a low threshold for starting IV antibiotics, given the fact      that she had a PICC line that could predispose to bacteremia.      Lonia Blood, M.D.  Electronically Signed     SL/MEDQ  D:  05/18/2005  T:  05/18/2005  Job:  [id number]   cc:   Georgianne Fick, M.D.  Fax: [phone]

## 2011-05-19 LAB — CBC
HCT: 32 — ABNORMAL LOW
MCHC: 33.3
MCV: 88.1
RBC: 3.63 — ABNORMAL LOW
WBC: 5.3

## 2011-05-19 LAB — COMPREHENSIVE METABOLIC PANEL
BUN: 5 — ABNORMAL LOW
CO2: 27
Chloride: 108
Creatinine, Ser: 0.65
GFR calc non Af Amer: >60
Total Bilirubin: 0.4

## 2011-05-20 LAB — COMPREHENSIVE METABOLIC PANEL
ALT: 10
AST: 13
Alkaline Phosphatase: 53
CO2: 27
GFR calc Af Amer: >60
GFR calc non Af Amer: >60
Glucose, Bld: 92
Potassium: 3.7
Sodium: 141

## 2011-05-20 LAB — HEPATIC FUNCTION PANEL
ALT: 15
AST: 28
Bilirubin, Direct: 0.3

## 2011-05-20 LAB — CSF CELL COUNT WITH DIFFERENTIAL
Eosinophils, CSF: 2 — ABNORMAL HIGH
Lymphs, CSF: 87 — ABNORMAL HIGH
Monocyte-Macrophage-Spinal Fluid: 3 — ABNORMAL LOW
RBC Count, CSF: 5 — ABNORMAL HIGH
Segmented Neutrophils-CSF: 7 — ABNORMAL HIGH
Tube #: 4
WBC, CSF: 129 — ABNORMAL HIGH

## 2011-05-20 LAB — CBC
Hemoglobin: 10 — ABNORMAL LOW
Hemoglobin: 11.9 — ABNORMAL LOW
RBC: 3.38 — ABNORMAL LOW
RBC: 4.04
RDW: 16 — ABNORMAL HIGH
WBC: 5.2

## 2011-05-20 LAB — DIFFERENTIAL
Basophils Absolute: 0.1
Lymphocytes Relative: 31
Monocytes Absolute: 0.6
Neutro Abs: 3.8

## 2011-05-20 LAB — URINALYSIS, ROUTINE W REFLEX MICROSCOPIC
Nitrite: NEGATIVE
Specific Gravity, Urine: 1.011
Urobilinogen, UA: 0.2

## 2011-05-20 LAB — BASIC METABOLIC PANEL
Calcium: 9
GFR calc Af Amer: >60
GFR calc non Af Amer: >60
Glucose, Bld: 97
Sodium: 137

## 2011-05-20 LAB — HSV PCR
HSV 2 , PCR: NOT DETECTED
HSV, PCR: NOT DETECTED

## 2011-05-20 LAB — CULTURE, BLOOD (ROUTINE X 2)
Culture: NO GROWTH
Culture: NO GROWTH

## 2011-05-20 LAB — GRAM STAIN

## 2011-05-20 LAB — CSF CULTURE W GRAM STAIN

## 2011-05-23 ENCOUNTER — Emergency Department (HOSPITAL_BASED_OUTPATIENT_CLINIC_OR_DEPARTMENT_OTHER)
Admission: EM | Admit: 2011-05-23 | Discharge: 2011-05-23 | Disposition: A | Discharge status: Home / Self Care | Payer: BC Managed Care – PPO | Attending: Emergency Medicine | Admitting: Emergency Medicine

## 2011-05-23 ENCOUNTER — Emergency Department (INDEPENDENT_AMBULATORY_CARE_PROVIDER_SITE_OTHER): Payer: BC Managed Care – PPO

## 2011-05-23 ENCOUNTER — Encounter: Payer: Self-pay | Admitting: *Deleted

## 2011-05-23 DIAGNOSIS — Y92410 Unspecified street and highway as the place of occurrence of the external cause: Secondary | ICD-10-CM | POA: Insufficient documentation

## 2011-05-23 DIAGNOSIS — S139XXA Sprain of joints and ligaments of unspecified parts of neck, initial encounter: Secondary | ICD-10-CM | POA: Insufficient documentation

## 2011-05-23 DIAGNOSIS — M542 Cervicalgia: Secondary | ICD-10-CM

## 2011-05-23 DIAGNOSIS — S161XXA Strain of muscle, fascia and tendon at neck level, initial encounter: Secondary | ICD-10-CM

## 2011-05-23 DIAGNOSIS — M549 Dorsalgia, unspecified: Secondary | ICD-10-CM | POA: Insufficient documentation

## 2011-05-23 DIAGNOSIS — S39012A Strain of muscle, fascia and tendon of lower back, initial encounter: Secondary | ICD-10-CM

## 2011-05-23 DIAGNOSIS — S335XXA Sprain of ligaments of lumbar spine, initial encounter: Secondary | ICD-10-CM | POA: Insufficient documentation

## 2011-05-23 MED ORDER — HYDROCODONE-ACETAMINOPHEN 5-325 MG PO TABS: 1.0000 | ORAL_TABLET | ORAL | Status: AC | PRN

## 2011-05-23 MED ORDER — OXYCODONE-ACETAMINOPHEN 5-325 MG PO TABS
1.0000 | ORAL_TABLET | Freq: Once | ORAL | Status: AC
  Administered 2011-05-23: 1 via ORAL
  Filled 2011-05-23: qty 1

## 2011-05-23 MED ORDER — CYCLOBENZAPRINE HCL 10 MG PO TABS: 10.0000 mg | ORAL_TABLET | Freq: Three times a day (TID) | ORAL | Status: AC | PRN

## 2011-05-23 MED ORDER — IBUPROFEN 800 MG PO TABS: 800.0000 mg | ORAL_TABLET | Freq: Three times a day (TID) | ORAL | Status: AC

## 2011-05-23 NOTE — ED Provider Notes (Signed)
Medical screening examination/treatment/procedure(s) were performed by non-physician practitioner and as supervising physician I was immediately available for consultation/collaboration.  Ethelda Chick, MD 05/23/11 2045
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
## 2011-05-23 NOTE — ED Notes (Signed)
Upon review of the chart, pt has not been assessed by RN since placement in room.  See systems assessment for this RN's assessment for this time only.

## 2011-07-05 ENCOUNTER — Emergency Department (HOSPITAL_BASED_OUTPATIENT_CLINIC_OR_DEPARTMENT_OTHER)
Admission: EM | Admit: 2011-07-05 | Discharge: 2011-07-05 | Disposition: A | Discharge status: Home / Self Care | Payer: BC Managed Care – PPO | Attending: Emergency Medicine | Admitting: Emergency Medicine

## 2011-07-05 ENCOUNTER — Encounter (HOSPITAL_BASED_OUTPATIENT_CLINIC_OR_DEPARTMENT_OTHER): Payer: Self-pay | Admitting: Student

## 2011-07-05 ENCOUNTER — Emergency Department (INDEPENDENT_AMBULATORY_CARE_PROVIDER_SITE_OTHER): Payer: BC Managed Care – PPO

## 2011-07-05 DIAGNOSIS — R0602 Shortness of breath: Secondary | ICD-10-CM | POA: Insufficient documentation

## 2011-07-05 DIAGNOSIS — R079 Chest pain, unspecified: Secondary | ICD-10-CM | POA: Insufficient documentation

## 2011-07-05 DIAGNOSIS — R05 Cough: Secondary | ICD-10-CM

## 2011-07-05 DIAGNOSIS — R071 Chest pain on breathing: Secondary | ICD-10-CM | POA: Insufficient documentation

## 2011-07-05 DIAGNOSIS — IMO0001 Reserved for inherently not codable concepts without codable children: Secondary | ICD-10-CM | POA: Insufficient documentation

## 2011-07-05 DIAGNOSIS — R0789 Other chest pain: Secondary | ICD-10-CM

## 2011-07-05 DIAGNOSIS — R0609 Other forms of dyspnea: Secondary | ICD-10-CM

## 2011-07-05 MED ORDER — GI COCKTAIL ~~LOC~~
30.0000 mL | Freq: Once | ORAL | Status: AC
  Administered 2011-07-05: 30 mL via ORAL
  Filled 2011-07-05: qty 30

## 2011-07-05 MED ORDER — OMEPRAZOLE 20 MG PO CPDR: 20.0000 mg | DELAYED_RELEASE_CAPSULE | Freq: Every day | ORAL | Status: DC

## 2011-07-05 MED ORDER — KETOROLAC TROMETHAMINE 30 MG/ML IJ SOLN
60.0000 mg | Freq: Once | INTRAMUSCULAR | Status: AC
  Administered 2011-07-05: 60 mg via INTRAMUSCULAR
  Filled 2011-07-05: qty 2

## 2011-07-05 MED ORDER — IBUPROFEN 800 MG PO TABS: 800.0000 mg | ORAL_TABLET | Freq: Three times a day (TID) | ORAL | Status: AC

## 2011-07-05 NOTE — ED Notes (Signed)
Pt in with c/o substernal chest pressure and heaviness that started this morning prior to lunch and reports pain as constant. Pt also report onset of generalized URI Flu like s/sx since Saturday. Reports taking mucinex for cold with last dose at noon, 1 hr prior to onset of chest pain.

## 2011-07-05 NOTE — ED Provider Notes (Signed)
History     CSN: [id number] Arrival date & time: 07/05/2011  2:46 PM   First MD Initiated Contact with Patient 07/05/11 1457      Chief Complaint  Patient presents with  . Chest Pain    (Consider location/radiation/quality/duration/timing/severity/associated sxs/prior treatment) HPI Comments: Patient presents with sudden onset of chest tightness and pressure around 10 AM it has been constant and unrelenting since. This stable though she is sitting at her desk and has not improved. She's not take anything for the pain. She did develop upper respiratory symptoms over the weekend and this history herself of Mucinex for rhinorrhea, sore throat and body aches. Denies any fever. She does have some shortness of breath.  She has not had pain of this before.  She denies any personal cardiac history, is not a smoker there is no family history of early cardiac disease. She reports she's had high cholesterol on screening exam.  The history is provided by the patient.    History reviewed. No pertinent past medical history.  History reviewed. No pertinent past surgical history.  History reviewed. No pertinent family history.  History  Substance Use Topics  . Smoking status: Never Smoker   . Smokeless tobacco: Not on file  . Alcohol Use: No    OB History    Grav Para Term Preterm Abortions TAB SAB Ect Mult Living                  Review of Systems  Constitutional: Negative for activity change and appetite change.  HENT: Positive for sore throat. Negative for congestion and rhinorrhea.   Respiratory: Positive for chest tightness and shortness of breath.   Cardiovascular: Positive for chest pain.  Gastrointestinal: Negative for nausea, vomiting and abdominal pain.  Genitourinary: Negative for dysuria and hematuria.  Musculoskeletal: Positive for myalgias and arthralgias.  Skin: Negative for rash.  Neurological: Negative for dizziness, weakness and headaches.    Allergies  Citric  acid and Penicillins cross reactors  Home Medications   Current Outpatient Rx  Name Route Sig Dispense Refill  . VALACYCLOVIR HCL 500 MG PO TABS Oral Take 1,000 mg by mouth at bedtime.      . IBUPROFEN 800 MG PO TABS Oral Take 1 tablet (800 mg total) by mouth 3 (three) times daily. 21 tablet 0  . OMEPRAZOLE 20 MG PO CPDR Oral Take 1 capsule (20 mg total) by mouth daily. 30 capsule 0    BP 128/62  Pulse 64  Temp(Src) 98.6 F (37 C) (Oral)  Resp 20  Wt 157 lb (71.215 kg)  SpO2 100%  LMP 06/26/2011  Physical Exam  Constitutional: She is oriented to person, place, and time. She appears well-developed and well-nourished. No distress.  HENT:  Head: Normocephalic and atraumatic.  Mouth/Throat: Oropharynx is clear and moist. No oropharyngeal exudate.  Eyes: Conjunctivae are normal. Pupils are equal, round, and reactive to light.  Neck: Normal range of motion.  Cardiovascular: Normal rate, regular rhythm and normal heart sounds.   Pulmonary/Chest: Effort normal and breath sounds normal. No respiratory distress. She exhibits tenderness.       Anterior chest wall tender to palpation, no rash  Abdominal: Soft. There is no tenderness. There is no rebound and no guarding.  Musculoskeletal: Normal range of motion. She exhibits no edema and no tenderness.  Neurological: She is alert and oriented to person, place, and time. No cranial nerve deficit.  Skin: Skin is warm.    ED Course  Procedures (including critical  care time)  Labs Reviewed - No data to display Dg Chest 2 View  07/05/2011  *RADIOLOGY REPORT*  Clinical Data: Chest pressure and heaviness today.  Dyspnea. Cough.  CHEST - 2 VIEW  Comparison: None.  Findings: Normal sized heart.  Clear lungs.  Very minimal scoliosis.  IMPRESSION: Normal examination.  Original Report Authenticated By: Darrol Angel, M.D.     1. Chest wall pain       MDM  Substernal chest pressure and tightness has been constant for the past on a half  hours. Patient's description of pain, young age, reproducibility of symptoms are not consistent with acute coronary syndrome.  She has no history of coronary artery disease, diabetes, hypertension.  She does not use birth control and is PERC negative.   Date: 07/05/2011  Rate: 63  Rhythm: normal sinus rhythm  QRS Axis: normal  Intervals: normal  ST/T Wave abnormalities: normal  Conduction Disutrbances:none  Narrative Interpretation:   Old EKG Reviewed: none available          Glynn Octave, MD 07/06/11 0004

## 2012-06-03 ENCOUNTER — Emergency Department (HOSPITAL_BASED_OUTPATIENT_CLINIC_OR_DEPARTMENT_OTHER)
Admission: EM | Admit: 2012-06-03 | Discharge: 2012-06-03 | Disposition: A | Discharge status: Home / Self Care | Payer: BC Managed Care – PPO | Attending: Emergency Medicine | Admitting: Emergency Medicine

## 2012-06-03 ENCOUNTER — Encounter (HOSPITAL_BASED_OUTPATIENT_CLINIC_OR_DEPARTMENT_OTHER): Payer: Self-pay | Admitting: *Deleted

## 2012-06-03 DIAGNOSIS — G43909 Migraine, unspecified, not intractable, without status migrainosus: Secondary | ICD-10-CM | POA: Insufficient documentation

## 2012-06-03 DIAGNOSIS — Z79899 Other long term (current) drug therapy: Secondary | ICD-10-CM | POA: Insufficient documentation

## 2012-06-03 HISTORY — DX: Migraine, unspecified, not intractable, without status migrainosus: G43.909

## 2012-06-03 MED ORDER — METOCLOPRAMIDE HCL 5 MG/ML IJ SOLN
10.0000 mg | Freq: Once | INTRAMUSCULAR | Status: AC
  Administered 2012-06-03: 10 mg via INTRAVENOUS
  Filled 2012-06-03: qty 2

## 2012-06-03 MED ORDER — SODIUM CHLORIDE 0.9 % IV BOLUS (SEPSIS)
1000.0000 mL | Freq: Once | INTRAVENOUS | Status: AC
  Administered 2012-06-03: 1000 mL via INTRAVENOUS

## 2012-06-03 MED ORDER — DIPHENHYDRAMINE HCL 50 MG/ML IJ SOLN
25.0000 mg | Freq: Once | INTRAMUSCULAR | Status: AC
  Administered 2012-06-03: 25 mg via INTRAVENOUS
  Filled 2012-06-03: qty 1

## 2012-06-03 MED ORDER — KETOROLAC TROMETHAMINE 30 MG/ML IJ SOLN
30.0000 mg | Freq: Once | INTRAMUSCULAR | Status: AC
  Administered 2012-06-03: 30 mg via INTRAVENOUS
  Filled 2012-06-03: qty 1

## 2012-06-03 NOTE — ED Notes (Signed)
Dr. Linker at bedside  

## 2012-06-03 NOTE — ED Provider Notes (Signed)
History   This chart was scribed for Anita Chick, MD by Sofie Rower. The patient was seen in room MH02/MH02 and the patient's care was started at 9:26PM.     CSN: [id number]  Arrival date & time 06/03/12  2016   First MD Initiated Contact with Patient 06/03/12 2126      Chief Complaint  Patient presents with  . Migraine    (Consider location/radiation/quality/duration/timing/severity/associated sxs/prior treatment) Patient is a 31 y.o. female presenting with migraines. The history is provided by the patient. No language interpreter was used.  Migraine This is a recurrent problem. The current episode started more than 2 days ago. The problem occurs constantly. The problem has been gradually worsening. Associated symptoms include headaches. Exacerbated by: Bright light.  Nothing relieves the symptoms. She has tried acetaminophen for the symptoms. The treatment provided no relief.    [name] is a 32 y.o. female , with a hx of migraine, who presents to the Emergency Department complaining of sudden, progressively worsening, migraine, located at the right, front side of the head, onset three days ago (05/31/12).  Associated symptoms include photophobia, nausea and blurred vision. The pt rates her headache at a 10/10 at present, and has taken Excedrin migraine, tylenol PM, and benadryl migraine at home which do not provide relief of her migraine. The pt has a hx of allergy to penicillins.   The pt denies fever, vomiting, and weakness located within any extremities associated with the migraine.  The pt does not smoke or drink alcohol.   PCP is Dr. Nicholos Johns.    Past Medical History  Diagnosis Date  . Migraine     History reviewed. No pertinent past surgical history.  No family history on file.  History  Substance Use Topics  . Smoking status: Never Smoker   . Smokeless tobacco: Never Used  . Alcohol Use: No    OB History    Grav Para Term Preterm Abortions TAB  SAB Ect Mult Living                  Review of Systems  Neurological: Positive for headaches.  All other systems reviewed and are negative.    Allergies  Citric acid and Penicillins cross reactors  Home Medications   Current Outpatient Rx  Name Route Sig Dispense Refill  . ASPIRIN-ACETAMINOPHEN-CAFFEINE 250-250-65 MG PO TABS Oral Take 1 tablet by mouth every 6 (six) hours as needed.    Marland Kitchen DIPHENHYDRAMINE HCL (SLEEP) 25 MG PO TABS Oral Take 25 mg by mouth at bedtime as needed.    Marland Kitchen DIPHENHYDRAMINE-APAP (SLEEP) 25-500 MG PO TABS Oral Take 1 tablet by mouth at bedtime as needed.    Marland Kitchen VALACYCLOVIR HCL 500 MG PO TABS Oral Take 1,000 mg by mouth at bedtime.      . OMEPRAZOLE 20 MG PO CPDR Oral Take 1 capsule (20 mg total) by mouth daily. 30 capsule 0    BP 120/78  Pulse 67  Temp 99.5 F (37.5 C) (Oral)  Resp 20  Ht 5\' 7"  (1.702 m)  Wt 163 lb (73.936 kg)  BMI 25.53 kg/m2  SpO2 100%  LMP 05/27/2012  Physical Exam  Nursing note and vitals reviewed. Constitutional: She is oriented to person, place, and time. She appears well-developed and well-nourished.  HENT:  Head: Atraumatic.  Right Ear: External ear normal.  Left Ear: External ear normal.  Nose: Nose normal.  Eyes: Conjunctivae normal and EOM are normal. Pupils are equal, round, and reactive  to light.  Neck: Normal range of motion.  Cardiovascular: Normal rate, regular rhythm and normal heart sounds.   Pulmonary/Chest: Effort normal and breath sounds normal.  Abdominal: Soft. Bowel sounds are normal.  Musculoskeletal: Normal range of motion.  Neurological: She is alert and oriented to person, place, and time. No cranial nerve deficit.       Cranial nerves 2-12 tested and intact. Strength and sensation normal.   Skin: Skin is warm and dry.  Psychiatric: She has a normal mood and affect. Her behavior is normal.    ED Course  Procedures (including critical care time)  DIAGNOSTIC STUDIES: Oxygen Saturation is 100% on  room air, normal by my interpretation.    COORDINATION OF CARE:   9:59 PM- Treatment plan concerning IV fluids and pain management discussed with patient. Pt agrees with treatment.   10:56 PM pt feels much improved- headache is down from 10/10 to 1/10 at this time     Labs Reviewed - No data to display No results found.   1. Migraine       MDM  PT with migraine similar to her prior migraines, she has normal neuro exam.  No signs or symptoms of SAH or other emergent pathology at this time.  Pt felt much improved after meds in ED.  Discharged with strict return precautions.  Pt agreeable with plan.     I personally performed the services described in this documentation, which was scribed in my presence. The recorded information has been reviewed and considered.    Anita Chick, MD 06/08/12 [phone]

## 2012-06-03 NOTE — ED Notes (Signed)
Hx of mha with sx since Thursday- c/o nausea- home meds not effective

## 2012-08-08 HISTORY — PX: FETAL BLOOD TRANSFUSION: SHX1602

## 2012-11-16 ENCOUNTER — Other Ambulatory Visit: Payer: Self-pay | Admitting: Obstetrics and Gynecology

## 2012-12-06 ENCOUNTER — Encounter (HOSPITAL_COMMUNITY): Payer: Self-pay

## 2012-12-06 ENCOUNTER — Inpatient Hospital Stay (HOSPITAL_COMMUNITY)
Admission: AD | Admit: 2012-12-06 | Discharge: 2012-12-06 | Disposition: A | Discharge status: Home / Self Care | Payer: BC Managed Care – PPO | Source: Ambulatory Visit | Attending: Obstetrics and Gynecology | Admitting: Obstetrics and Gynecology

## 2012-12-06 DIAGNOSIS — R109 Unspecified abdominal pain: Secondary | ICD-10-CM | POA: Insufficient documentation

## 2012-12-06 DIAGNOSIS — N390 Urinary tract infection, site not specified: Secondary | ICD-10-CM | POA: Insufficient documentation

## 2012-12-06 DIAGNOSIS — O2341 Unspecified infection of urinary tract in pregnancy, first trimester: Secondary | ICD-10-CM

## 2012-12-06 DIAGNOSIS — O210 Mild hyperemesis gravidarum: Secondary | ICD-10-CM | POA: Insufficient documentation

## 2012-12-06 DIAGNOSIS — K5900 Constipation, unspecified: Secondary | ICD-10-CM | POA: Insufficient documentation

## 2012-12-06 DIAGNOSIS — O99892 Other specified diseases and conditions complicating childbirth: Secondary | ICD-10-CM

## 2012-12-06 DIAGNOSIS — O2390 Unspecified genitourinary tract infection in pregnancy, unspecified trimester: Secondary | ICD-10-CM | POA: Insufficient documentation

## 2012-12-06 HISTORY — DX: Anemia, unspecified: D64.9

## 2012-12-06 LAB — URINALYSIS, ROUTINE W REFLEX MICROSCOPIC
Bilirubin Urine: NEGATIVE
Glucose, UA: NEGATIVE mg/dL
Ketones, ur: 15 mg/dL — AB
Nitrite: NEGATIVE
Protein, ur: NEGATIVE mg/dL
pH: 6 (ref 5.0–8.0)

## 2012-12-06 LAB — URINE MICROSCOPIC-ADD ON

## 2012-12-06 MED ORDER — LACTATED RINGERS IV BOLUS (SEPSIS)
1000.0000 mL | Freq: Once | INTRAVENOUS | Status: AC
  Administered 2012-12-06: 1000 mL via INTRAVENOUS

## 2012-12-06 MED ORDER — PROMETHAZINE HCL 25 MG/ML IJ SOLN
12.5000 mg | Freq: Once | INTRAMUSCULAR | Status: AC
  Administered 2012-12-06: 12.5 mg via INTRAVENOUS
  Filled 2012-12-06: qty 1

## 2012-12-06 MED ORDER — NITROFURANTOIN MONOHYD MACRO 100 MG PO CAPS: 100.0000 mg | ORAL_CAPSULE | Freq: Two times a day (BID) | ORAL | Status: DC

## 2012-12-06 NOTE — MAU Note (Signed)
Patient states she has not had a MB since 4-21. Started having nausea and vomiting on 4-29 but unable to keep anything down. Has been taking OTC stool softners without relief. Having mid abdominal pain. Denies bleeding or discharge. Has a headache and feels weak.

## 2012-12-06 NOTE — MAU Provider Note (Signed)
History     CSN: [id number]  Arrival date and time: 12/06/12 1635   First Provider Initiated Contact with Patient 12/06/12 1757      Chief Complaint  Patient presents with  . Emesis  . Constipation  . Abdominal Pain   HPI  Patient is a G2P1001 here at 12.1 wks IUP who states she has not had a bowel movement since 4-21. Previously having a bowel movement 2x/week.  Started having worsening nausea and vomiting on 4-29 and unable to keep anything down. Has been taking OTC stool softners without relief (Milk of Magnesia, prune juice, colace). Having mid abdominal pain. Denies bleeding or discharge. Has a headache and feels weak.    Past Medical History  Diagnosis Date  . Migraine   . Anemia     History reviewed. No pertinent past surgical history.  History reviewed. No pertinent family history.  History  Substance Use Topics  . Smoking status: Never Smoker   . Smokeless tobacco: Never Used  . Alcohol Use: No    Allergies:  Allergies  Allergen Reactions  . Citric Acid     Burns inside of mouth  . Penicillins Cross Reactors Hives and Rash    Prescriptions prior to admission  Medication Sig Dispense Refill  . aspirin-acetaminophen-caffeine (EXCEDRIN MIGRAINE) 250-250-65 MG per tablet Take 1 tablet by mouth every 6 (six) hours as needed.      . diphenhydrAMINE (SOMINEX) 25 MG tablet Take 25 mg by mouth at bedtime as needed.      . diphenhydramine-acetaminophen (TYLENOL PM) 25-500 MG TABS Take 1 tablet by mouth at bedtime as needed.      Marland Kitchen omeprazole (PRILOSEC) 20 MG capsule Take 1 capsule (20 mg total) by mouth daily.  30 capsule  0  . valACYclovir (VALTREX) 500 MG tablet Take 1,000 mg by mouth at bedtime.          Review of Systems  Constitutional: Negative for fever and chills.  Gastrointestinal: Positive for nausea, vomiting, abdominal pain (mid upper) and constipation.  Genitourinary: Negative.   Neurological: Positive for weakness and headaches.   Physical Exam    Blood pressure 119/64, pulse 73, temperature 98.3 F (36.8 C), temperature source Oral, resp. rate 16, height 5\' 7"  (1.702 m), weight 73.936 kg (163 lb), SpO2 100.00%.  Physical Exam  Constitutional: She is oriented to person, place, and time. She appears well-developed and well-nourished. No distress.  HENT:  Head: Normocephalic.  Neck: Normal range of motion. Neck supple.  Cardiovascular: Normal rate, regular rhythm and normal heart sounds.   Respiratory: Effort normal and breath sounds normal.  GI: Soft. Bowel sounds are normal. She exhibits distension. There is tenderness (mid abdominal).  FHR 156  Genitourinary: No bleeding around the vagina. Vaginal discharge:    Neurological: She is alert and oriented to person, place, and time.  Skin: Skin is warm and dry.    MAU Course  Procedures  LR IV Phenergan 12.5 mg Soap Suds enema with good return Dr. Henderson Cloud called and agrees with plan of care  Results for orders placed during the hospital encounter of 12/06/12 (from the past 24 hour(s))  URINALYSIS, ROUTINE W REFLEX MICROSCOPIC     Status: Abnormal   Collection Time    12/06/12  5:10 PM      Result Value Range   Color, Urine YELLOW  YELLOW   APPearance HAZY (*) CLEAR   Specific Gravity, Urine >1.030 (*) 1.005 - 1.030   pH 6.0  5.0 - 8.0  Glucose, UA NEGATIVE  NEGATIVE mg/dL   Hgb urine dipstick TRACE (*) NEGATIVE   Bilirubin Urine NEGATIVE  NEGATIVE   Ketones, ur 15 (*) NEGATIVE mg/dL   Protein, ur NEGATIVE  NEGATIVE mg/dL   Urobilinogen, UA 0.2  0.0 - 1.0 mg/dL   Nitrite NEGATIVE  NEGATIVE   Leukocytes, UA SMALL (*) NEGATIVE  URINE MICROSCOPIC-ADD ON     Status: Abnormal   Collection Time    12/06/12  5:10 PM      Result Value Range   Squamous Epithelial / LPF FEW (*) RARE   WBC, UA 3-6  <3 WBC/hpf   RBC / HPF 0-2  <3 RBC/hpf   Bacteria, UA FEW (*) RARE   Urine-Other MUCOUS PRESENT      Assessment and Plan  UTI Constipation - Resolved  Plan: DC to  home RX Macrobid Urine Culture Keep follow-up appointment  [facility] 12/06/2012, 6:00 PM

## 2012-12-07 LAB — OB RESULTS CONSOLE ABO/RH: RH Type: POSITIVE

## 2012-12-07 LAB — OB RESULTS CONSOLE RUBELLA ANTIBODY, IGM: Rubella: IMMUNE

## 2012-12-07 LAB — OB RESULTS CONSOLE HEPATITIS B SURFACE ANTIGEN: Hepatitis B Surface Ag: NEGATIVE

## 2012-12-07 LAB — OB RESULTS CONSOLE HIV ANTIBODY (ROUTINE TESTING): HIV: NONREACTIVE

## 2012-12-10 LAB — URINE CULTURE

## 2013-06-14 ENCOUNTER — Encounter (HOSPITAL_COMMUNITY): Payer: Self-pay | Admitting: *Deleted

## 2013-06-14 ENCOUNTER — Inpatient Hospital Stay (HOSPITAL_COMMUNITY)
Admission: AD | Admit: 2013-06-14 | Discharge: 2013-06-18 | DRG: 766 | Disposition: A | Discharge status: Home / Self Care | Payer: BC Managed Care – PPO | Source: Ambulatory Visit | Attending: Obstetrics and Gynecology | Admitting: Obstetrics and Gynecology

## 2013-06-14 DIAGNOSIS — O4290 Premature rupture of membranes, unspecified as to length of time between rupture and onset of labor, unspecified weeks of gestation: Secondary | ICD-10-CM | POA: Diagnosis present

## 2013-06-14 DIAGNOSIS — O9902 Anemia complicating childbirth: Secondary | ICD-10-CM | POA: Diagnosis present

## 2013-06-14 DIAGNOSIS — D649 Anemia, unspecified: Secondary | ICD-10-CM | POA: Diagnosis present

## 2013-06-14 NOTE — MAU Note (Signed)
My water broke about an hour ago. Clear fld and i am continuing to leak a lot of fld. Having contractions too

## 2013-06-14 NOTE — Progress Notes (Signed)
Nash Mantis in [facility] called and report given. Pt to BS via w/c from Triage for further eval of SROM

## 2013-06-15 ENCOUNTER — Inpatient Hospital Stay (HOSPITAL_COMMUNITY): Payer: BC Managed Care – PPO | Admitting: Anesthesiology

## 2013-06-15 ENCOUNTER — Encounter (HOSPITAL_COMMUNITY): Payer: Self-pay | Admitting: *Deleted

## 2013-06-15 ENCOUNTER — Encounter (HOSPITAL_COMMUNITY): Payer: BC Managed Care – PPO | Admitting: Anesthesiology

## 2013-06-15 ENCOUNTER — Encounter (HOSPITAL_COMMUNITY)
Admission: AD | Disposition: A | Discharge status: Home / Self Care | Payer: Self-pay | Source: Ambulatory Visit | Attending: Obstetrics and Gynecology

## 2013-06-15 DIAGNOSIS — D649 Anemia, unspecified: Secondary | ICD-10-CM

## 2013-06-15 DIAGNOSIS — O9902 Anemia complicating childbirth: Secondary | ICD-10-CM

## 2013-06-15 DIAGNOSIS — O4290 Premature rupture of membranes, unspecified as to length of time between rupture and onset of labor, unspecified weeks of gestation: Secondary | ICD-10-CM

## 2013-06-15 LAB — CBC
HCT: 22.5 % — ABNORMAL LOW (ref 36.0–46.0)
Hemoglobin: 6.8 g/dL — CL (ref 12.0–15.0)
MCHC: 30.2 g/dL (ref 30.0–36.0)
MCV: 68.8 fL — ABNORMAL LOW (ref 78.0–100.0)
RBC: 3.27 MIL/uL — ABNORMAL LOW (ref 3.87–5.11)
WBC: 8.2 10*3/uL (ref 4.0–10.5)

## 2013-06-15 LAB — PREPARE RBC (CROSSMATCH)

## 2013-06-15 LAB — RPR: RPR Ser Ql: NONREACTIVE

## 2013-06-15 SURGERY — Surgical Case
Anesthesia: Epidural | Site: Abdomen | Wound class: Clean Contaminated

## 2013-06-15 MED ORDER — HYDROCORTISONE 1 % EX CREA
TOPICAL_CREAM | Freq: Four times a day (QID) | CUTANEOUS | Status: DC
  Administered 2013-06-15: 1 via TOPICAL
  Filled 2013-06-15: qty 28

## 2013-06-15 MED ORDER — DIPHENHYDRAMINE HCL 25 MG PO CAPS
25.0000 mg | ORAL_CAPSULE | Freq: Once | ORAL | Status: AC
  Administered 2013-06-15: 25 mg via ORAL
  Filled 2013-06-15 (×2): qty 1

## 2013-06-15 MED ORDER — ONDANSETRON HCL 4 MG/2ML IJ SOLN: 4.0000 mg | INTRAMUSCULAR | Status: DC | PRN

## 2013-06-15 MED ORDER — CEFAZOLIN (ANCEF) 1 G IV SOLR: 2.0000 g | INTRAVENOUS | Status: DC

## 2013-06-15 MED ORDER — MEPERIDINE HCL 25 MG/ML IJ SOLN: 6.2500 mg | INTRAMUSCULAR | Status: DC | PRN

## 2013-06-15 MED ORDER — OXYTOCIN BOLUS FROM INFUSION: 500.0000 mL | INTRAVENOUS | Status: DC

## 2013-06-15 MED ORDER — ONDANSETRON HCL 4 MG PO TABS
4.0000 mg | ORAL_TABLET | ORAL | Status: DC | PRN
  Administered 2013-06-16 – 2013-06-18 (×2): 4 mg via ORAL
  Filled 2013-06-15 (×2): qty 1

## 2013-06-15 MED ORDER — SIMETHICONE 80 MG PO CHEW
80.0000 mg | CHEWABLE_TABLET | Freq: Three times a day (TID) | ORAL | Status: DC
  Administered 2013-06-16 – 2013-06-17 (×5): 80 mg via ORAL
  Filled 2013-06-15 (×6): qty 1

## 2013-06-15 MED ORDER — OXYTOCIN 10 UNIT/ML IJ SOLN
40.0000 [IU] | INTRAVENOUS | Status: DC | PRN
  Administered 2013-06-15: 40 [IU] via INTRAVENOUS

## 2013-06-15 MED ORDER — MENTHOL 3 MG MT LOZG: 1.0000 | LOZENGE | OROMUCOSAL | Status: DC | PRN

## 2013-06-15 MED ORDER — TRANEXAMIC ACID 100 MG/ML IV SOLN
1000.0000 mg | INTRAVENOUS | Status: AC
  Administered 2013-06-15: 1000 mg via INTRAVENOUS
  Filled 2013-06-15: qty 10

## 2013-06-15 MED ORDER — FENTANYL 2.5 MCG/ML BUPIVACAINE 1/10 % EPIDURAL INFUSION (WH - ANES)
14.0000 mL/h | INTRAMUSCULAR | Status: DC | PRN
  Administered 2013-06-15: 14 mL/h via EPIDURAL

## 2013-06-15 MED ORDER — DIBUCAINE 1 % RE OINT: 1.0000 "application " | TOPICAL_OINTMENT | RECTAL | Status: DC | PRN

## 2013-06-15 MED ORDER — NALBUPHINE HCL 10 MG/ML IJ SOLN
5.0000 mg | INTRAMUSCULAR | Status: DC | PRN
  Filled 2013-06-15: qty 1

## 2013-06-15 MED ORDER — LIDOCAINE HCL (PF) 1 % IJ SOLN: 30.0000 mL | INTRAMUSCULAR | Status: DC | PRN

## 2013-06-15 MED ORDER — SODIUM CHLORIDE 0.9 % IJ SOLN: 3.0000 mL | INTRAMUSCULAR | Status: DC | PRN

## 2013-06-15 MED ORDER — DIPHENHYDRAMINE HCL 50 MG/ML IJ SOLN: 12.5000 mg | INTRAMUSCULAR | Status: DC | PRN

## 2013-06-15 MED ORDER — FENTANYL CITRATE 0.05 MG/ML IJ SOLN
INTRAMUSCULAR | Status: AC
  Administered 2013-06-15: 50 ug via INTRAVENOUS
  Filled 2013-06-15: qty 2

## 2013-06-15 MED ORDER — WITCH HAZEL-GLYCERIN EX PADS: 1.0000 "application " | MEDICATED_PAD | CUTANEOUS | Status: DC | PRN

## 2013-06-15 MED ORDER — KETOROLAC TROMETHAMINE 30 MG/ML IJ SOLN: 30.0000 mg | Freq: Four times a day (QID) | INTRAMUSCULAR | Status: AC | PRN

## 2013-06-15 MED ORDER — METOCLOPRAMIDE HCL 5 MG/ML IJ SOLN: 10.0000 mg | Freq: Three times a day (TID) | INTRAMUSCULAR | Status: DC | PRN

## 2013-06-15 MED ORDER — IBUPROFEN 600 MG PO TABS
600.0000 mg | ORAL_TABLET | Freq: Four times a day (QID) | ORAL | Status: DC
  Administered 2013-06-16 – 2013-06-18 (×10): 600 mg via ORAL
  Filled 2013-06-15 (×10): qty 1

## 2013-06-15 MED ORDER — ACETAMINOPHEN 325 MG PO TABS
650.0000 mg | ORAL_TABLET | Freq: Once | ORAL | Status: AC
  Administered 2013-06-15: 650 mg via ORAL
  Filled 2013-06-15: qty 2

## 2013-06-15 MED ORDER — NALOXONE HCL 0.4 MG/ML IJ SOLN: 0.4000 mg | INTRAMUSCULAR | Status: DC | PRN

## 2013-06-15 MED ORDER — FENTANYL 2.5 MCG/ML BUPIVACAINE 1/10 % EPIDURAL INFUSION (WH - ANES)
14.0000 mL/h | INTRAMUSCULAR | Status: DC | PRN
  Filled 2013-06-15 (×2): qty 125

## 2013-06-15 MED ORDER — FLEET ENEMA 7-19 GM/118ML RE ENEM: 1.0000 | ENEMA | RECTAL | Status: DC | PRN

## 2013-06-15 MED ORDER — MORPHINE SULFATE 0.5 MG/ML IJ SOLN
INTRAMUSCULAR | Status: AC
  Filled 2013-06-15: qty 10

## 2013-06-15 MED ORDER — FENTANYL CITRATE 0.05 MG/ML IJ SOLN
25.0000 ug | INTRAMUSCULAR | Status: DC | PRN
  Administered 2013-06-15 (×2): 50 ug via INTRAVENOUS

## 2013-06-15 MED ORDER — LACTATED RINGERS IV SOLN
INTRAVENOUS | Status: DC
  Administered 2013-06-15 (×2): via INTRAVENOUS

## 2013-06-15 MED ORDER — CEFAZOLIN SODIUM-DEXTROSE 2-3 GM-% IV SOLR
INTRAVENOUS | Status: AC
  Filled 2013-06-15: qty 50

## 2013-06-15 MED ORDER — LANOLIN HYDROUS EX OINT: 1.0000 "application " | TOPICAL_OINTMENT | CUTANEOUS | Status: DC | PRN

## 2013-06-15 MED ORDER — LACTATED RINGERS IV SOLN: INTRAVENOUS | Status: DC

## 2013-06-15 MED ORDER — ACETAMINOPHEN 325 MG PO TABS: 650.0000 mg | ORAL_TABLET | ORAL | Status: DC | PRN

## 2013-06-15 MED ORDER — LACTATED RINGERS IV SOLN
500.0000 mL | Freq: Once | INTRAVENOUS | Status: AC
  Administered 2013-06-15: 500 mL via INTRAVENOUS

## 2013-06-15 MED ORDER — MORPHINE SULFATE (PF) 0.5 MG/ML IJ SOLN
INTRAMUSCULAR | Status: DC | PRN
Start: 2013-06-15 — End: 2013-06-17
  Administered 2013-06-15: 4 mg via EPIDURAL
  Administered 2013-06-15: 1 mg via INTRAVENOUS

## 2013-06-15 MED ORDER — LIDOCAINE-EPINEPHRINE (PF) 2 %-1:200000 IJ SOLN
INTRAMUSCULAR | Status: AC
  Filled 2013-06-15: qty 20

## 2013-06-15 MED ORDER — TERBUTALINE SULFATE 1 MG/ML IJ SOLN: 0.2500 mg | Freq: Once | INTRAMUSCULAR | Status: DC | PRN

## 2013-06-15 MED ORDER — OXYTOCIN 40 UNITS IN LACTATED RINGERS INFUSION - SIMPLE MED
1.0000 m[IU]/min | INTRAVENOUS | Status: DC
  Administered 2013-06-15 (×2): 2 m[IU]/min via INTRAVENOUS
  Filled 2013-06-15: qty 1000

## 2013-06-15 MED ORDER — FENTANYL CITRATE 0.05 MG/ML IJ SOLN
INTRAMUSCULAR | Status: DC | PRN
  Administered 2013-06-15: 100 ug via INTRAVENOUS

## 2013-06-15 MED ORDER — CEFAZOLIN SODIUM-DEXTROSE 2-3 GM-% IV SOLR
2.0000 g | Freq: Once | INTRAVENOUS | Status: AC
  Administered 2013-06-15: 2 g via INTRAVENOUS

## 2013-06-15 MED ORDER — ONDANSETRON HCL 4 MG/2ML IJ SOLN
INTRAMUSCULAR | Status: DC | PRN
  Administered 2013-06-15: 4 mg via INTRAVENOUS

## 2013-06-15 MED ORDER — ONDANSETRON HCL 4 MG/2ML IJ SOLN
INTRAMUSCULAR | Status: AC
  Filled 2013-06-15: qty 2

## 2013-06-15 MED ORDER — SODIUM BICARBONATE 8.4 % IV SOLN
INTRAVENOUS | Status: DC | PRN
  Administered 2013-06-15 (×3): 5 mL via EPIDURAL

## 2013-06-15 MED ORDER — KETOROLAC TROMETHAMINE 60 MG/2ML IM SOLN
INTRAMUSCULAR | Status: AC
  Administered 2013-06-15: 60 mg via INTRAMUSCULAR
  Filled 2013-06-15: qty 2

## 2013-06-15 MED ORDER — OXYCODONE-ACETAMINOPHEN 5-325 MG PO TABS: 1.0000 | ORAL_TABLET | ORAL | Status: DC | PRN

## 2013-06-15 MED ORDER — PHENYLEPHRINE 40 MCG/ML (10ML) SYRINGE FOR IV PUSH (FOR BLOOD PRESSURE SUPPORT): 80.0000 ug | PREFILLED_SYRINGE | INTRAVENOUS | Status: DC | PRN

## 2013-06-15 MED ORDER — PRENATAL MULTIVITAMIN CH
1.0000 | ORAL_TABLET | Freq: Every day | ORAL | Status: DC
  Administered 2013-06-16: 1 via ORAL
  Filled 2013-06-15 (×2): qty 1

## 2013-06-15 MED ORDER — FENTANYL CITRATE 0.05 MG/ML IJ SOLN
INTRAMUSCULAR | Status: AC
  Filled 2013-06-15: qty 2

## 2013-06-15 MED ORDER — SCOPOLAMINE 1 MG/3DAYS TD PT72
1.0000 | MEDICATED_PATCH | Freq: Once | TRANSDERMAL | Status: AC
  Administered 2013-06-15: 1.5 mg via TRANSDERMAL

## 2013-06-15 MED ORDER — PHENYLEPHRINE 40 MCG/ML (10ML) SYRINGE FOR IV PUSH (FOR BLOOD PRESSURE SUPPORT)
80.0000 ug | PREFILLED_SYRINGE | INTRAVENOUS | Status: DC | PRN
  Filled 2013-06-15: qty 10

## 2013-06-15 MED ORDER — SODIUM CHLORIDE 0.9 % IV SOLN
INTRAVENOUS | Status: DC | PRN
  Administered 2013-06-15: 12:00:00 via INTRAVENOUS

## 2013-06-15 MED ORDER — SIMETHICONE 80 MG PO CHEW
80.0000 mg | CHEWABLE_TABLET | ORAL | Status: DC
  Administered 2013-06-16 – 2013-06-18 (×3): 80 mg via ORAL
  Filled 2013-06-15 (×3): qty 1

## 2013-06-15 MED ORDER — ONDANSETRON HCL 4 MG/2ML IJ SOLN: 4.0000 mg | Freq: Three times a day (TID) | INTRAMUSCULAR | Status: DC | PRN

## 2013-06-15 MED ORDER — CITRIC ACID-SODIUM CITRATE 334-500 MG/5ML PO SOLN
30.0000 mL | ORAL | Status: DC | PRN
  Administered 2013-06-15: 30 mL via ORAL
  Filled 2013-06-15: qty 15

## 2013-06-15 MED ORDER — FENTANYL 2.5 MCG/ML BUPIVACAINE 1/10 % EPIDURAL INFUSION (WH - ANES)
INTRAMUSCULAR | Status: DC | PRN
  Administered 2013-06-15: 14 mL/h via EPIDURAL

## 2013-06-15 MED ORDER — DIPHENHYDRAMINE HCL 25 MG PO CAPS: 25.0000 mg | ORAL_CAPSULE | Freq: Four times a day (QID) | ORAL | Status: DC | PRN

## 2013-06-15 MED ORDER — KETOROLAC TROMETHAMINE 60 MG/2ML IM SOLN
60.0000 mg | Freq: Once | INTRAMUSCULAR | Status: AC | PRN
  Administered 2013-06-15: 60 mg via INTRAMUSCULAR

## 2013-06-15 MED ORDER — SENNOSIDES-DOCUSATE SODIUM 8.6-50 MG PO TABS
2.0000 | ORAL_TABLET | ORAL | Status: DC
  Administered 2013-06-16 (×2): 2 via ORAL
  Filled 2013-06-15 (×2): qty 2

## 2013-06-15 MED ORDER — SIMETHICONE 80 MG PO CHEW: 80.0000 mg | CHEWABLE_TABLET | ORAL | Status: DC | PRN

## 2013-06-15 MED ORDER — BUTORPHANOL TARTRATE 1 MG/ML IJ SOLN
1.0000 mg | Freq: Once | INTRAMUSCULAR | Status: AC | PRN
  Administered 2013-06-15: 1 mg via INTRAVENOUS
  Filled 2013-06-15: qty 1

## 2013-06-15 MED ORDER — OXYCODONE-ACETAMINOPHEN 5-325 MG PO TABS
1.0000 | ORAL_TABLET | ORAL | Status: DC | PRN
  Administered 2013-06-16: 1 via ORAL
  Administered 2013-06-16 – 2013-06-18 (×9): 2 via ORAL
  Filled 2013-06-15 (×5): qty 2
  Filled 2013-06-15: qty 1
  Filled 2013-06-15 (×3): qty 2
  Filled 2013-06-15: qty 1
  Filled 2013-06-15: qty 2
  Filled 2013-06-15: qty 1

## 2013-06-15 MED ORDER — IBUPROFEN 600 MG PO TABS: 600.0000 mg | ORAL_TABLET | Freq: Four times a day (QID) | ORAL | Status: DC | PRN

## 2013-06-15 MED ORDER — NALOXONE HCL 1 MG/ML IJ SOLN: 1.0000 ug/kg/h | INTRAVENOUS | Status: DC | PRN

## 2013-06-15 MED ORDER — OXYTOCIN 40 UNITS IN LACTATED RINGERS INFUSION - SIMPLE MED: 62.5000 mL/h | INTRAVENOUS | Status: DC

## 2013-06-15 MED ORDER — LACTATED RINGERS IV SOLN
INTRAVENOUS | Status: DC | PRN
  Administered 2013-06-15: 11:00:00 via INTRAVENOUS

## 2013-06-15 MED ORDER — ONDANSETRON HCL 4 MG/2ML IJ SOLN: 4.0000 mg | Freq: Four times a day (QID) | INTRAMUSCULAR | Status: DC | PRN

## 2013-06-15 MED ORDER — EPHEDRINE 5 MG/ML INJ: 10.0000 mg | INTRAVENOUS | Status: DC | PRN

## 2013-06-15 MED ORDER — OXYTOCIN 10 UNIT/ML IJ SOLN
INTRAMUSCULAR | Status: AC
  Filled 2013-06-15: qty 4

## 2013-06-15 MED ORDER — EPHEDRINE 5 MG/ML INJ
10.0000 mg | INTRAVENOUS | Status: DC | PRN
  Filled 2013-06-15: qty 4

## 2013-06-15 MED ORDER — ZOLPIDEM TARTRATE 5 MG PO TABS: 5.0000 mg | ORAL_TABLET | Freq: Every evening | ORAL | Status: DC | PRN

## 2013-06-15 MED ORDER — DIPHENHYDRAMINE HCL 25 MG PO CAPS
25.0000 mg | ORAL_CAPSULE | ORAL | Status: DC | PRN
  Administered 2013-06-16: 25 mg via ORAL
  Filled 2013-06-15: qty 1

## 2013-06-15 MED ORDER — SCOPOLAMINE 1 MG/3DAYS TD PT72
MEDICATED_PATCH | TRANSDERMAL | Status: AC
  Administered 2013-06-15: 1.5 mg via TRANSDERMAL
  Filled 2013-06-15: qty 1

## 2013-06-15 MED ORDER — LACTATED RINGERS IV SOLN
500.0000 mL | INTRAVENOUS | Status: DC | PRN
  Administered 2013-06-15: 1000 mL via INTRAVENOUS

## 2013-06-15 MED ORDER — TETANUS-DIPHTH-ACELL PERTUSSIS 5-2.5-18.5 LF-MCG/0.5 IM SUSP
0.5000 mL | Freq: Once | INTRAMUSCULAR | Status: AC
  Administered 2013-06-17: 0.5 mL via INTRAMUSCULAR
  Filled 2013-06-15: qty 0.5

## 2013-06-15 MED ORDER — SODIUM BICARBONATE 8.4 % IV SOLN
INTRAVENOUS | Status: AC
  Filled 2013-06-15: qty 50

## 2013-06-15 MED ORDER — LIDOCAINE HCL (PF) 1 % IJ SOLN
INTRAMUSCULAR | Status: DC | PRN
  Administered 2013-06-15 (×2): 4 mL

## 2013-06-15 MED ORDER — DIPHENHYDRAMINE HCL 50 MG/ML IJ SOLN: 25.0000 mg | INTRAMUSCULAR | Status: DC | PRN

## 2013-06-15 MED ORDER — OXYTOCIN 40 UNITS IN LACTATED RINGERS INFUSION - SIMPLE MED: 62.5000 mL/h | INTRAVENOUS | Status: AC

## 2013-06-15 SURGICAL SUPPLY — 29 items
CLAMP CORD UMBIL (MISCELLANEOUS) IMPLANT
CLOTH BEACON ORANGE TIMEOUT ST (SAFETY) ×2 IMPLANT
DRAPE LG THREE QUARTER DISP (DRAPES) IMPLANT
DRSG OPSITE POSTOP 4X10 (GAUZE/BANDAGES/DRESSINGS) ×2 IMPLANT
DURAPREP 26ML APPLICATOR (WOUND CARE) ×2 IMPLANT
ELECT REM PT RETURN 9FT ADLT (ELECTROSURGICAL) ×2
ELECTRODE REM PT RTRN 9FT ADLT (ELECTROSURGICAL) ×1 IMPLANT
EXTRACTOR VACUUM M CUP 4 TUBE (SUCTIONS) IMPLANT
GLOVE BIO SURGEON STRL SZ 6.5 (GLOVE) ×2 IMPLANT
GLOVE BIOGEL PI IND STRL 6.5 (GLOVE) ×1 IMPLANT
GLOVE BIOGEL PI INDICATOR 6.5 (GLOVE) ×1
GOWN STRL REIN XL XLG (GOWN DISPOSABLE) ×4 IMPLANT
KIT ABG SYR 3ML LUER SLIP (SYRINGE) ×2 IMPLANT
NEEDLE HYPO 25X5/8 SAFETYGLIDE (NEEDLE) ×2 IMPLANT
NS IRRIG 1000ML POUR BTL (IV SOLUTION) ×2 IMPLANT
PACK C SECTION WH (CUSTOM PROCEDURE TRAY) ×2 IMPLANT
PAD OB MATERNITY 4.3X12.25 (PERSONAL CARE ITEMS) ×2 IMPLANT
RTRCTR C-SECT PINK 25CM LRG (MISCELLANEOUS) ×2 IMPLANT
STAPLER VISISTAT 35W (STAPLE) ×2 IMPLANT
SUT MON AB 2-0 CT1 27 (SUTURE) ×2 IMPLANT
SUT MON AB 4-0 PS1 27 (SUTURE) IMPLANT
SUT PDS AB 0 CTX 60 (SUTURE) IMPLANT
SUT PLAIN 2 0 XLH (SUTURE) IMPLANT
SUT VIC AB 0 CTX 36 (SUTURE) ×4
SUT VIC AB 0 CTX36XBRD ANBCTRL (SUTURE) ×4 IMPLANT
SUT VIC AB 4-0 KS 27 (SUTURE) IMPLANT
TOWEL OR 17X24 6PK STRL BLUE (TOWEL DISPOSABLE) ×2 IMPLANT
TRAY FOLEY CATH 14FR (SET/KITS/TRAYS/PACK) IMPLANT
WATER STERILE IRR 1000ML POUR (IV SOLUTION) IMPLANT

## 2013-06-15 NOTE — Anesthesia Postprocedure Evaluation (Signed)
  Anesthesia Post-op Note  Patient: [name]  Procedure(s) Performed: Procedure(s) (LRB): CESAREAN SECTION (N/A)  Patient Location: PACU  Anesthesia Type: Spinal  Level of Consciousness: awake and alert   Airway and Oxygen Therapy: Patient Spontanous Breathing  Post-op Pain: mild  Post-op Assessment: Post-op Vital signs reviewed, Patient's Cardiovascular Status Stable, Respiratory Function Stable, Patent Airway and No signs of Nausea or vomiting  Last Vitals:  Filed Vitals:   06/15/13 1515  BP: 99/67  Pulse: 72  Temp:   Resp: 16    Post-op Vital Signs: stable   Complications: No apparent anesthesia complications

## 2013-06-15 NOTE — H&P (Signed)
32 y.o. [redacted]w[redacted]d  G2P1001 comes in c/o LOF since approx 1030pm.  Otherwise has good fetal movement and no bleeding.  Past Medical History  Diagnosis Date  . Migraine   . Anemia    History reviewed. No pertinent past surgical history.  OB History  Gravida Para Term Preterm AB SAB TAB Ectopic Multiple Living  2 1 1       1     # Outcome Date GA Lbr Len/2nd Weight Sex Delivery Anes PTL Lv  2 CUR           1 TRM 07/30/09 [redacted]w[redacted]d  6 lb (2.722 kg) F SVD EPI  Y      History   Social History  . Marital Status: Married    Spouse Name: N/A    Number of Children: N/A  . Years of Education: N/A   Occupational History  . Not on file.   Social History Main Topics  . Smoking status: Never Smoker   . Smokeless tobacco: Never Used  . Alcohol Use: No  . Drug Use: No  . Sexual Activity: Yes    Birth Control/ Protection: None   Other Topics Concern  . Not on file   Social History Narrative  . No narrative on file   Citric acid; Latex; and Penicillins cross reactors    Prenatal Transfer Tool  Maternal Diabetes: No Genetic Screening: Normal Maternal Ultrasounds/Referrals: Normal Fetal Ultrasounds or other Referrals:  None Maternal Substance Abuse:  No Significant Maternal Medications:  None Significant Maternal Lab Results: Lab values include: Group B Strep negative  Other PNC: uncomplicated.    Filed Vitals:   06/15/13 0704  BP:   Pulse: 65  Temp:   Resp:      Lungs/Cor:  NAD Abdomen:  soft, gravid Ex:  no cords, erythema SVE:  2/70/-2 at admission FHTs:  120, good STV, NST R Toco:  occassional   A/P   Admitted to L&D  GBS Neg  Pitocin started for infrequent ctx and no cervical change, stopped around 2am for late deceleration with good FHT recovery, low baseline.  Plan to restart pit at [facility] has now been restarted.  Will monitor for fetal tolerance.  Anemia in pregnancy, Hb ~8.5 at 28week labs, pt was to start FeSO4 tid but was not compliant.  Hb at admission 6.8.   T&C for 2U.  Pt asymptomatic currently.  Low threshold for transfusion.  Philip Aspen

## 2013-06-15 NOTE — Consult Note (Signed)
Neonatology Note:   Attendance at C-section:    I was asked by Dr. Claiborne Billings to attend this primary C/S at term due to [facility]. The mother is a G2P1 A pos, GBS neg with an otherwise uncomplicated pregnancy. ROM 13 hours prior to delivery, fluid clear. Infant vigorous with good spontaneous cry and tone. Needed bulb suctioning. Ap 8/9. Lungs clear to ausc in DR. To CN to care of Pediatrician.   Doretha Sou, MD

## 2013-06-15 NOTE — Anesthesia Procedure Notes (Signed)
Epidural Patient location during procedure: OB Start time: 06/15/2013 4:20 AM End time: 06/15/2013 4:30 AM  Staffing Anesthesiologist: Lewie Loron R Performed by: anesthesiologist   Preanesthetic Checklist Completed: patient identified, pre-op evaluation, timeout performed, IV checked, risks and benefits discussed and monitors and equipment checked  Epidural Patient position: sitting Prep: site prepped and draped and DuraPrep Patient monitoring: blood pressure, continuous pulse ox and heart rate Approach: midline Injection technique: LOR air and LOR saline  Needle:  Needle type: Tuohy  Needle gauge: 17 G Needle length: 9 cm Needle insertion depth: 6 cm Catheter type: closed end flexible Catheter size: 19 Gauge Catheter at skin depth: 12 cm Test dose: negative  Assessment Sensory level: T7 Events: blood not aspirated, injection not painful, no injection resistance, negative IV test and no paresthesia  Additional Notes Reason for block:procedure for pain

## 2013-06-15 NOTE — Anesthesia Preprocedure Evaluation (Signed)
Anesthesia Evaluation  Patient identified by MRN, date of birth, ID band Patient awake    Reviewed: Allergy & Precautions, H&P , NPO status , Patient's Chart, lab work & pertinent test results  Airway Mallampati: II TM Distance: >3 FB     Dental   Pulmonary neg pulmonary ROS,  breath sounds clear to auscultation        Cardiovascular negative cardio ROS  Rhythm:Regular Rate:Normal     Neuro/Psych  Headaches, Hx of viral meningitis  negative psych ROS   GI/Hepatic negative GI ROS, Neg liver ROS,   Endo/Other  negative endocrine ROS  Renal/GU negative Renal ROS     Musculoskeletal negative musculoskeletal ROS (+)   Abdominal   Peds  Hematology  (+) Blood dyscrasia, anemia ,   Anesthesia Other Findings   Reproductive/Obstetrics (+) Pregnancy                           Anesthesia Physical Anesthesia Plan  ASA: III  Anesthesia Plan: Epidural   Post-op Pain Management:    Induction:   Airway Management Planned:   Additional Equipment:   Intra-op Plan:   Post-operative Plan:   Informed Consent: I have reviewed the patients History and Physical, chart, labs and discussed the procedure including the risks, benefits and alternatives for the proposed anesthesia with the patient or authorized representative who has indicated his/her understanding and acceptance.     Plan Discussed with:   Anesthesia Plan Comments:         Anesthesia Quick Evaluation

## 2013-06-15 NOTE — Progress Notes (Signed)
3cc fentanyl infusion wasted in sink when new bag placed in infusion pump.

## 2013-06-15 NOTE — Transfer of Care (Signed)
Immediate Anesthesia Transfer of Care Note  Patient: [name]  Procedure(s) Performed: Procedure(s): CESAREAN SECTION (N/A)  Patient Location: PACU  Anesthesia Type:Epidural  Level of Consciousness: awake, alert  and oriented  Airway & Oxygen Therapy: Patient Spontanous Breathing  Post-op Assessment: Report given to PACU RN and Post -op Vital signs reviewed and stable  Post vital signs: Reviewed and stable  Complications: No apparent anesthesia complications

## 2013-06-15 NOTE — Progress Notes (Signed)
Pt comfortable s/p epidural. Does reports now feeling some dizziness, no CP/SOB. FHT 115 + accels, occ. Early decel TOCO q4-5 SVE: deferred Discussed severe anemia, potential for PPH, patient agrees to transfusion of 2U PRBCs, risks/benefits reviewed. Will continue pitocin as tolerated.  Recheck H/H 1500.

## 2013-06-15 NOTE — Progress Notes (Signed)
[name] is currently receiving her first unit of PBRBCs.   FHT  120 min var with repetative late decels TOCO q7-9 SVE def Discussed NRFHT with patient, R/B/complications of c/s.  She agrees to proceed with c/s. 2g ancef

## 2013-06-15 NOTE — Brief Op Note (Signed)
06/14/2013 - 06/15/2013  12:21 PM  PATIENT:  [name]  32 y.o. female  PRE-OPERATIVE DIAGNOSIS:  Nonreassuring Fetal Heart Rate, Fetal intolerance of IOL for PROM, remote from delivery  POST-OPERATIVE DIAGNOSIS:  same  PROCEDURE:  Procedure(s): CESAREAN SECTION (N/A)  SURGEON:  Surgeon(s) and Role:    * Philip Aspen, DO - Primary    * Adam Phenix, MD - Assisting   ANESTHESIA:   epidural  EBL:  Total I/O In: 725 [I.V.:400; Blood:325] Out: 1700 [Urine:1200; Blood:500]  FINDINGS  BLOOD ADMINISTERED: 2U PRBCs administered before and during surgery  SPECIMEN:  Source of Specimen:  placenta  DISPOSITION OF SPECIMEN:  PATHOLOGY  COUNTS:  YES  PATIENT DISPOSITION:  PACU - hemodynamically stable.

## 2013-06-16 LAB — CBC
Hemoglobin: 7.1 g/dL — ABNORMAL LOW (ref 12.0–15.0)
MCHC: 31.4 g/dL (ref 30.0–36.0)
RBC: 3.11 MIL/uL — ABNORMAL LOW (ref 3.87–5.11)

## 2013-06-16 MED ORDER — FERROUS SULFATE 325 (65 FE) MG PO TABS
325.0000 mg | ORAL_TABLET | Freq: Three times a day (TID) | ORAL | Status: DC
  Administered 2013-06-16 – 2013-06-17 (×4): 325 mg via ORAL
  Filled 2013-06-16 (×6): qty 1

## 2013-06-16 NOTE — Anesthesia Postprocedure Evaluation (Signed)
  Anesthesia Post-op Note  Patient: [name]  Procedure(s) Performed: Procedure(s): CESAREAN SECTION (N/A)  Patient Location: Mother/Baby  Anesthesia Type:Epidural  Level of Consciousness: awake and alert   Airway and Oxygen Therapy: Patient Spontanous Breathing  Post-op Pain: mild  Post-op Assessment: Patient's Cardiovascular Status Stable, Respiratory Function Stable, No signs of Nausea or vomiting, Pain level controlled, No headache, No residual numbness and No residual motor weakness  Post-op Vital Signs: stable  Complications: No apparent anesthesia complications

## 2013-06-16 NOTE — Progress Notes (Signed)
Patient is eating and drinking without N/V. Not yet voided, not yet ambulating.  Pain control is good.  No CP/SOB, no dizziness.  No other complaints. Bleeding minimal.  Filed Vitals:   06/15/13 1930 06/15/13 2030 06/16/13 0030 06/16/13 0425  BP: 105/67 105/60 102/60 90/52  Pulse: 75 72 84 77  Temp: 98.4 F (36.9 C) 98.6 F (37 C) 98.8 F (37.1 C) 98.4 F (36.9 C)  TempSrc: Oral Oral Oral Oral  Resp: 18 18 18 18   Height:      Weight:      SpO2: 97% 97% 95% 97%    Fundus firm Perineum without swelling. Inc: c/d/i Ext: no CT  Lab Results  Component Value Date   WBC 8.8 06/16/2013   HGB 7.1* 06/16/2013   HCT 22.6* 06/16/2013   MCV 72.7* 06/16/2013   PLT PENDING 06/16/2013    --/--/A POS, A POS (11/08 0105)  A/P Post op day #1 s/p c/s for NRFHT, fetal intolerance of IOL, remote from delivery. Anemia: admission Hb 6.8, pt became symptomatic and received 2U PBRCs.  Lysteda given IV pre-op.  C/s EBL 500.  Post op am Hb stable at 7.1 and pt asymptomatic.  Started FeSO4 tid.  Advised daily use of stool softner and Miralax prn.  Baby doing well.  Circ done.  Routine care.      Philip Aspen

## 2013-06-17 ENCOUNTER — Encounter (HOSPITAL_COMMUNITY): Payer: Self-pay | Admitting: Obstetrics and Gynecology

## 2013-06-17 NOTE — Lactation Note (Signed)
This note was copied from the chart of [name]. Lactation Consultation Note  Patient Name: Anita Karryn Kosinski [id number] Date: 06/17/2013 Reason for consult: Other (Comment) (charting for exclusion)   Maternal Data Formula Feeding for Exclusion: Yes Reason for exclusion: Mother's choice to formula feed on admision  Feeding Feeding Type: Formula  LATCH Score/Interventions                      Lactation Tools Discussed/Used     Consult Status Consult Status: Complete    Lynda Rainwater 06/17/2013, 3:57 PM

## 2013-06-17 NOTE — Progress Notes (Signed)
Post Op Day 2 Subjective:  Complains of abdominal pain.  +flatus, normal appetite.  She appears to have a low tolerance for discomfort.  Objective: Blood pressure 119/89, pulse 85, temperature 98.8 F (37.1 C), temperature source Oral, resp. rate 18, height 5' 7.5" (1.715 m), weight 89.359 kg (197 lb), SpO2 97.00%, unknown if currently breastfeeding.  Physical Exam:  General: alert Lochia: appropriate Uterine Fundus: firm Incision: healing well   Recent Labs  06/15/13 0105 06/16/13 0547  HGB 6.8* 7.1*  HCT 22.5* 22.6*    Assessment/Plan: Plan for discharge tomorrow   LOS: 3 days   Payton Moder D 06/17/2013, 9:39 AM

## 2013-06-17 NOTE — Progress Notes (Signed)
Called Dr. Arlyce Dice per pt request for medication. Pt c/o diarrhea. Dr. Arlyce Dice did not want to give any new orders at this time.

## 2013-06-17 NOTE — Op Note (Signed)
NAMEJASALYN, [name] NO.:  [id number]  MEDICAL RECORD NO.:  [id number]  LOCATION:  9146                          FACILITY:  [facility]  PHYSICIAN:  Philip Aspen, DO    DATE OF BIRTH:  Nov 12, 1980  DATE OF PROCEDURE:  06/15/2013 DATE OF DISCHARGE:                              OPERATIVE REPORT   PREOPERATIVE DIAGNOSES:  Nonreassuring fetal heart rate, fetal intolerance of induction of labor for premature rupture of membranes, remote from delivery.  POSTOPERATIVE DIAGNOSES:  Nonreassuring fetal heart rate, fetal intolerance of induction of labor for premature rupture of membranes, remote from delivery.  PROCEDURE:  Low Transverse Cesarean section.  SURGEON:  Philip Aspen, DO  ASSISTANT:  Scheryl Darter, MD  ANESTHESIA:  Epidural.  FLUIDS:  400 mL plus a total of 2 units of packed red blood cells.  URINE:  1200 mL.  BLOOD LOSS:  500 mL.  FINDINGS:  Normal-appearing uterus.  Normal tubes and ovaries bilaterally.  Female infant in cephalic presentation with Apgars of 8 and 9, and weight 6 pounds 5 ounces.  SPECIMEN:  Placenta to pathology.  Umbilical cord blood gas:  7.27.  COMPLICATIONS:  None.  CONDITION:  Stable to PACU.  DESCRIPTION OF PROCEDURE:  The patient was taken to the operating room, where epidural anesthesia was administered and found to be adequate. She was then prepped and draped in normal sterile fashion in dorsal supine position.  A Pfannenstiel skin incision was made with a scalpel and carried down to the underlying layer of fascia with Bovie cautery. The fascia was incised in the midline and extended laterally with Mayo scissors.  Kocher clamps were placed at the superior aspect of this fascial incision, which was tented and the rectus muscles dissected off bluntly and sharply.  The Kocher clamps were then placed at the inferior aspect of the fascial incision and then the rectus muscles were dissected off bluntly and sharply.  The  rectus muscles were separated bluntly at the midline and the peritoneum was entered bluntly.  The peritoneal opening was extended by lateral traction.  The abdomen was manually explored and the uterus was found to be somewhat twisted to the patient's left.  An Alexis self retractor was placed and a scalpel was used to create a low-transverse incision.  The uterine incision was extended by caudal and cephalic traction.  The infant's head was located, elevated, and delivered without difficulty followed by the remaining body.  The cord was clamped and cut.  The infant was handed off to awaiting Neonatology.  Cord blood was collected and the segment of the umbilical cord was removed for cord pH collection.  The placenta was then removed by external uterine massage and gentle traction on the uterine from the umbilical cord.  The uterus was cleared off all clot and debris, and the uterine incision closed with Vicryl in a running, locked fashion with the second layer of horizontal imbrication with the second Vicryl suture.  The gutters were cleared off all clot and debris. Uterine incision was observed and found to be hemostatic after additional placement of 1 figure of eight at the midline.  The Alexis self retractor was removed.  Again, the  uterine incision was observed and found to be hemostatic.  The peritoneal incision was then closed with Monocryl in a running fashion.  The fascia was then reapproximated and closed with Vicryl in a running fashion.  The subcutaneous tissue was irrigated, dried, and minimal use of Bovie cautery was necessary for hemostasis.  The skin was then reapproximated and closed with staples. Sponge, lap, and needle counts were correct x2.  The patient was taken to recovery in stable condition.  She tolerated the procedure well.  Of note, IV Lysteda was administered preoperatively to reduce blood loss.  The patient's preoperative hemoglobin was found to be 6.8.   The first unit of packed red blood cells was started prior to the surgery And both units were administered by the end of the procedure.          ______________________________ Philip Aspen, DO     [place]/MEDQ  D:  06/16/2013  T:  06/17/2013  Job:  [id number]

## 2013-06-18 MED ORDER — FERROUS SULFATE 325 (65 FE) MG PO TABS: 325.0000 mg | ORAL_TABLET | Freq: Three times a day (TID) | ORAL | Status: DC

## 2013-06-18 MED ORDER — DIPHENOXYLATE-ATROPINE 2.5-0.025 MG PO TABS
1.0000 | ORAL_TABLET | ORAL | Status: DC | PRN
  Administered 2013-06-18: 1 via ORAL
  Filled 2013-06-18: qty 1

## 2013-06-18 MED ORDER — OXYCODONE-ACETAMINOPHEN 5-325 MG PO TABS: 1.0000 | ORAL_TABLET | ORAL | Status: DC | PRN

## 2013-06-18 MED ORDER — DIPHENOXYLATE-ATROPINE 2.5-0.025 MG PO TABS
2.0000 | ORAL_TABLET | Freq: Once | ORAL | Status: AC
  Administered 2013-06-18: 2 via ORAL
  Filled 2013-06-18: qty 2

## 2013-06-18 NOTE — Progress Notes (Signed)
  Patient is eating, ambulating, voiding.  Pain control is good.  Filed Vitals:   06/16/13 1715 06/17/13 0600 06/17/13 1825 06/18/13 0528  BP: 100/65 119/89 112/70 116/78  Pulse: 63 85 71 69  Temp: 98.8 F (37.1 C) 98.8 F (37.1 C) 98.6 F (37 C) 98.3 F (36.8 C)  TempSrc: Oral  Oral Oral  Resp: 18 18 18 18   Height:      Weight:      SpO2:        lungs:   clear to auscultation cor:    RRR Abdomen:  soft, appropriate tenderness, incisions intact and without erythema or exudate ex:    no cords   Lab Results  Component Value Date   WBC 8.8 06/16/2013   HGB 7.1* 06/16/2013   HCT 22.6* 06/16/2013   MCV 72.7* 06/16/2013   PLT 112* 06/16/2013    --/--/A POS, A POS (11/08 0105)/RI  A/P    Post operative day 3.  Routine post op and postpartum care.  Expect d/c today.  Percocet for pain control. H/H stable- pt to take iron at home.  VSS and assymptomatic.

## 2013-06-18 NOTE — Discharge Summary (Signed)
Obstetric Discharge Summary Reason for Admission: onset of labor and rupture of membranes Prenatal Procedures: none Intrapartum Procedures: cesarean: low cervical, transverse and 2 unit PRBC transfusion Postpartum Procedures: none Complications-Operative and Postpartum: none Hemoglobin  Date Value Range Status  06/16/2013 7.1* 12.0 - 15.0 g/dL Final     HCT  Date Value Range Status  06/16/2013 22.6* 36.0 - 46.0 % Final    Discharge Diagnoses: Term Pregnancy-delivered  Discharge Information: Date: 06/18/2013 Activity: pelvic rest Diet: routine Medications: PNV, Iron and Percocet Condition: stable Instructions: refer to practice specific booklet Discharge to: home Follow-up Information   Follow up with CALLAHAN, SIDNEY, DO.   Specialty:  Obstetrics and Gynecology   Contact information:   [address] [place] [address] [phone]       Newborn Data: Live born female  Birth Weight: 6 lb 5.1 oz (2865 g) APGAR: 8, 9  Home with mother.  [name] 06/18/2013, 8:31 AM

## 2013-06-18 NOTE — Progress Notes (Signed)
Called Dr. Arlyce Dice per pt. Request for anti-diarrheal.  Order rec'd for lomotil.

## 2013-06-19 LAB — TYPE AND SCREEN
ABO/RH(D): A POS
Antibody Screen: NEGATIVE
Unit division: 0
Unit division: 0
Unit division: 0

## 2013-09-27 ENCOUNTER — Emergency Department (HOSPITAL_COMMUNITY): Payer: BC Managed Care – PPO

## 2013-09-27 ENCOUNTER — Encounter (HOSPITAL_COMMUNITY): Payer: Self-pay | Admitting: Emergency Medicine

## 2013-09-27 ENCOUNTER — Inpatient Hospital Stay (HOSPITAL_COMMUNITY)
Admission: EM | Admit: 2013-09-27 | Discharge: 2013-09-30 | DRG: 076 | Disposition: A | Discharge status: Home / Self Care | Payer: BC Managed Care – PPO | Attending: Internal Medicine | Admitting: Internal Medicine

## 2013-09-27 DIAGNOSIS — Z79899 Other long term (current) drug therapy: Secondary | ICD-10-CM

## 2013-09-27 DIAGNOSIS — G43909 Migraine, unspecified, not intractable, without status migrainosus: Secondary | ICD-10-CM | POA: Diagnosis present

## 2013-09-27 DIAGNOSIS — G039 Meningitis, unspecified: Secondary | ICD-10-CM | POA: Diagnosis present

## 2013-09-27 DIAGNOSIS — D649 Anemia, unspecified: Secondary | ICD-10-CM | POA: Diagnosis present

## 2013-09-27 DIAGNOSIS — Z880 Allergy status to penicillin: Secondary | ICD-10-CM

## 2013-09-27 DIAGNOSIS — E876 Hypokalemia: Secondary | ICD-10-CM | POA: Diagnosis present

## 2013-09-27 DIAGNOSIS — A879 Viral meningitis, unspecified: Principal | ICD-10-CM | POA: Diagnosis present

## 2013-09-27 DIAGNOSIS — R509 Fever, unspecified: Secondary | ICD-10-CM

## 2013-09-27 DIAGNOSIS — G030 Nonpyogenic meningitis: Secondary | ICD-10-CM

## 2013-09-27 HISTORY — DX: Viral meningitis, unspecified: A87.9

## 2013-09-27 LAB — COMPREHENSIVE METABOLIC PANEL
ALT: 13 U/L (ref 0–35)
AST: 17 U/L (ref 0–37)
Albumin: 3.6 g/dL (ref 3.5–5.2)
Alkaline Phosphatase: 83 U/L (ref 39–117)
BUN: 6 mg/dL (ref 6–23)
CALCIUM: 9.3 mg/dL (ref 8.4–10.5)
CO2: 26 mEq/L (ref 19–32)
Chloride: 100 mEq/L (ref 96–112)
Creatinine, Ser: 0.81 mg/dL (ref 0.50–1.10)
GFR calc Af Amer: >90 mL/min (ref >90)
GFR calc non Af Amer: >90 mL/min (ref >90)
Glucose, Bld: 84 mg/dL (ref 70–99)
POTASSIUM: 3.3 meq/L — AB (ref 3.7–5.3)
Sodium: 138 mEq/L (ref 137–147)
TOTAL PROTEIN: 8 g/dL (ref 6.0–8.3)
Total Bilirubin: <0.2 mg/dL — ABNORMAL LOW (ref 0.3–1.2)

## 2013-09-27 LAB — CBC WITH DIFFERENTIAL/PLATELET
BASOS PCT: 1 % (ref 0–1)
Basophils Absolute: 0 10*3/uL (ref 0.0–0.1)
EOS ABS: 0.1 10*3/uL (ref 0.0–0.7)
EOS PCT: 1 % (ref 0–5)
HCT: 36.1 % (ref 36.0–46.0)
Hemoglobin: 11.6 g/dL — ABNORMAL LOW (ref 12.0–15.0)
Lymphocytes Relative: 39 % (ref 12–46)
Lymphs Abs: 2.4 10*3/uL (ref 0.7–4.0)
MCH: 27 pg (ref 26.0–34.0)
MCHC: 32.1 g/dL (ref 30.0–36.0)
MCV: 84 fL (ref 78.0–100.0)
MONOS PCT: 10 % (ref 3–12)
Monocytes Absolute: 0.6 10*3/uL (ref 0.1–1.0)
NEUTROS PCT: 49 % (ref 43–77)
Neutro Abs: 2.9 10*3/uL (ref 1.7–7.7)
Platelets: 245 10*3/uL (ref 150–400)
RBC: 4.3 MIL/uL (ref 3.87–5.11)
RDW: 15.2 % (ref 11.5–15.5)
WBC: 6 10*3/uL (ref 4.0–10.5)

## 2013-09-27 LAB — GRAM STAIN: GRAM STAIN: NONE SEEN

## 2013-09-27 LAB — CSF CELL COUNT WITH DIFFERENTIAL
Eosinophils, CSF: 0 % (ref 0–1)
Eosinophils, CSF: 0 % (ref 0–1)
LYMPHS CSF: 96 % — AB (ref 40–80)
Lymphs, CSF: 98 % — ABNORMAL HIGH (ref 40–80)
Monocyte-Macrophage-Spinal Fluid: 2 % — ABNORMAL LOW (ref 15–45)
Monocyte-Macrophage-Spinal Fluid: 3 % — ABNORMAL LOW (ref 15–45)
Other Cells, CSF: 1
RBC COUNT CSF: 1 /mm3 — AB
RBC Count, CSF: 2 /mm3 — ABNORMAL HIGH
SEGMENTED NEUTROPHILS-CSF: 0 % (ref 0–6)
Segmented Neutrophils-CSF: 0 % (ref 0–6)
TUBE #: 1
Tube #: 4
WBC, CSF: 123 /mm3 (ref 0–5)
WBC, CSF: 190 /mm3 (ref 0–5)

## 2013-09-27 LAB — LACTIC ACID, PLASMA: LACTIC ACID, VENOUS: 1.1 mmol/L (ref 0.5–2.2)

## 2013-09-27 MED ORDER — MORPHINE SULFATE 4 MG/ML IJ SOLN
4.0000 mg | Freq: Once | INTRAMUSCULAR | Status: AC
  Administered 2013-09-27: 4 mg via INTRAVENOUS
  Filled 2013-09-27: qty 1

## 2013-09-27 MED ORDER — SODIUM CHLORIDE 0.9 % IV BOLUS (SEPSIS)
1000.0000 mL | Freq: Once | INTRAVENOUS | Status: AC
  Administered 2013-09-27: 1000 mL via INTRAVENOUS

## 2013-09-27 MED ORDER — VANCOMYCIN HCL IN DEXTROSE 1-5 GM/200ML-% IV SOLN
1000.0000 mg | Freq: Once | INTRAVENOUS | Status: AC
  Administered 2013-09-28: 1000 mg via INTRAVENOUS
  Filled 2013-09-27: qty 200

## 2013-09-27 MED ORDER — ONDANSETRON HCL 4 MG/2ML IJ SOLN
4.0000 mg | Freq: Once | INTRAMUSCULAR | Status: AC
  Administered 2013-09-27: 4 mg via INTRAVENOUS
  Filled 2013-09-27: qty 2

## 2013-09-27 MED ORDER — DEXTROSE 5 % IV SOLN
2.0000 g | Freq: Once | INTRAVENOUS | Status: AC
  Administered 2013-09-28: 2 g via INTRAVENOUS
  Filled 2013-09-27: qty 2

## 2013-09-27 NOTE — ED Notes (Signed)
Consent signed for lumbar puncture.

## 2013-09-27 NOTE — ED Notes (Signed)
Pt is unable to void at this time.  

## 2013-09-27 NOTE — ED Notes (Signed)
Pt c/o increasing headache, head pressure, neck stiffness, back stiffness, and fever x 2 days.  Pain score 10/10.  Hx of viral meningitis.  Pt reports "this feels just like it did when I had meningitis."

## 2013-09-27 NOTE — ED Provider Notes (Signed)
CSN: [id number]     Arrival date & time 09/27/13  1823 History   First MD Initiated Contact with Patient 09/27/13 1908     Chief Complaint  Patient presents with  . Headache  . Neck Stiffness   . Fever     (Consider location/radiation/quality/duration/timing/severity/associated sxs/prior Treatment) HPI Comments: Patient presents with a two-day history of worsening headaches and associated neck stiffness. She describes a diffuse headache with pain going down into her neck and her back. She's also had fever up to 102 this started today. She feels achy all over. She denies any nausea or vomiting. She has a history of viral meningitis in the past and says this feels similar. She's had a little bit of nasal congestion. She denies any cough or chest congestion. She's been taking over-the-counter medications with no improvement of symptoms. She has a history of migraines but says this headache feels different.  Patient is a 33 y.o. female presenting with headaches and fever.  Headache Associated symptoms: back pain, congestion, fatigue, fever, myalgias and neck pain   Associated symptoms: no abdominal pain, no cough, no diarrhea, no dizziness, no nausea, no numbness and no vomiting   Fever Associated symptoms: congestion, headaches and myalgias   Associated symptoms: no chest pain, no chills, no cough, no diarrhea, no nausea, no rash, no rhinorrhea and no vomiting     Past Medical History  Diagnosis Date  . Migraine   . Anemia   . Viral meningitis    Past Surgical History  Procedure Laterality Date  . Cesarean section N/A 06/15/2013    Procedure: CESAREAN SECTION;  Surgeon: Philip Aspen, DO;  Location: [facility];  Service: Obstetrics;  Laterality: N/A;   History reviewed. No pertinent family history. History  Substance Use Topics  . Smoking status: Never Smoker   . Smokeless tobacco: Never Used  . Alcohol Use: No   OB History   Grav Para Term Preterm Abortions TAB SAB Ect Mult  Living   2 2 2       2      Review of Systems  Constitutional: Positive for fever and fatigue. Negative for chills and diaphoresis.  HENT: Positive for congestion. Negative for rhinorrhea and sneezing.   Eyes: Negative.   Respiratory: Negative for cough, chest tightness and shortness of breath.   Cardiovascular: Negative for chest pain and leg swelling.  Gastrointestinal: Negative for nausea, vomiting, abdominal pain, diarrhea and blood in stool.  Genitourinary: Negative for frequency, hematuria, flank pain and difficulty urinating.  Musculoskeletal: Positive for back pain, myalgias and neck pain. Negative for arthralgias.  Skin: Negative for rash.  Neurological: Positive for light-headedness and headaches. Negative for dizziness, speech difficulty, weakness and numbness.      Allergies  Citric acid; Latex; and Penicillins cross reactors  Home Medications   Current Outpatient Rx  Name  Route  Sig  Dispense  Refill  . valACYclovir (VALTREX) 500 MG tablet   Oral   Take 1,000 mg by mouth at bedtime.            BP 131/77  Pulse 78  Temp(Src) 100 F (37.8 C) (Oral)  Resp 16  SpO2 100%  LMP 09/09/2013 Physical Exam  Constitutional: She is oriented to person, place, and time. She appears well-developed and well-nourished.  HENT:  Head: Normocephalic and atraumatic.  Eyes: Pupils are equal, round, and reactive to light.  Photophobia  Neck:  Positive neck stiffness  Cardiovascular: Normal rate, regular rhythm and normal heart sounds.  Pulmonary/Chest: Effort normal and breath sounds normal. No respiratory distress. She has no wheezes. She has no rales. She exhibits no tenderness.  Abdominal: Soft. Bowel sounds are normal. There is no tenderness. There is no rebound and no guarding.  Musculoskeletal: Normal range of motion. She exhibits no edema.  Lymphadenopathy:    She has no cervical adenopathy.  Neurological: She is alert and oriented to person, place, and time. She  has normal strength. No cranial nerve deficit or sensory deficit. GCS eye subscore is 4. GCS verbal subscore is 5. GCS motor subscore is 6.  Skin: Skin is warm and dry. No rash noted.  Psychiatric: She has a normal mood and affect.    ED Course  Procedures (including critical care time) Labs Review Results for orders placed during the hospital encounter of 09/27/13  GRAM STAIN      Result Value Ref Range   Specimen Description CSF     Special Requests NONE     Gram Stain       Value: NO ORGANISMS SEEN     WBC PRESENT, PREDOMINANTLY MONONUCLEAR     CYTOSPIN PREP     Gram Stain Report Called to,Read Back By and Verified With: SHELL,A/ED @2338  ON 09/27/13 BY KARCZEWSKI,S.   Report Status 09/27/2013 FINAL    CBC WITH DIFFERENTIAL      Result Value Ref Range   WBC 6.0  4.0 - 10.5 K/uL   RBC 4.30  3.87 - 5.11 MIL/uL   Hemoglobin 11.6 (*) 12.0 - 15.0 g/dL   HCT 16.1  09.6 - 04.5 %   MCV 84.0  78.0 - 100.0 fL   MCH 27.0  26.0 - 34.0 pg   MCHC 32.1  30.0 - 36.0 g/dL   RDW 40.9  81.1 - 91.4 %   Platelets 245  150 - 400 K/uL   Neutrophils Relative % 49  43 - 77 %   Neutro Abs 2.9  1.7 - 7.7 K/uL   Lymphocytes Relative 39  12 - 46 %   Lymphs Abs 2.4  0.7 - 4.0 K/uL   Monocytes Relative 10  3 - 12 %   Monocytes Absolute 0.6  0.1 - 1.0 K/uL   Eosinophils Relative 1  0 - 5 %   Eosinophils Absolute 0.1  0.0 - 0.7 K/uL   Basophils Relative 1  0 - 1 %   Basophils Absolute 0.0  0.0 - 0.1 K/uL  COMPREHENSIVE METABOLIC PANEL      Result Value Ref Range   Sodium 138  137 - 147 mEq/L   Potassium 3.3 (*) 3.7 - 5.3 mEq/L   Chloride 100  96 - 112 mEq/L   CO2 26  19 - 32 mEq/L   Glucose, Bld 84  70 - 99 mg/dL   BUN 6  6 - 23 mg/dL   Creatinine, Ser 7.82  0.50 - 1.10 mg/dL   Calcium 9.3  8.4 - 95.6 mg/dL   Total Protein 8.0  6.0 - 8.3 g/dL   Albumin 3.6  3.5 - 5.2 g/dL   AST 17  0 - 37 U/L   ALT 13  0 - 35 U/L   Alkaline Phosphatase 83  39 - 117 U/L   Total Bilirubin <0.2 (*) 0.3 - 1.2  mg/dL   GFR calc non Af Amer >90  >90 mL/min   GFR calc Af Amer >90  >90 mL/min  LACTIC ACID, PLASMA      Result Value Ref Range   Lactic  Acid, Venous 1.1  0.5 - 2.2 mmol/L  CSF CELL COUNT WITH DIFFERENTIAL      Result Value Ref Range   Tube # 1     Color, CSF COLORLESS  COLORLESS   Appearance, CSF CLEAR  CLEAR   Supernatant NOT INDICATED     RBC Count, CSF 2 (*) 0 /cu mm   WBC, CSF 123 (*) 0 - 5 /cu mm   Segmented Neutrophils-CSF 0  0 - 6 %   Lymphs, CSF 98 (*) 40 - 80 %   Monocyte-Macrophage-Spinal Fluid 2 (*) 15 - 45 %   Eosinophils, CSF 0  0 - 1 %  CSF CELL COUNT WITH DIFFERENTIAL      Result Value Ref Range   Tube # 4     Color, CSF COLORLESS  COLORLESS   Appearance, CSF CLEAR  CLEAR   Supernatant NOT INDICATED     RBC Count, CSF 1 (*) 0 /cu mm   WBC, CSF 190 (*) 0 - 5 /cu mm   Segmented Neutrophils-CSF 0  0 - 6 %   Lymphs, CSF 96 (*) 40 - 80 %   Monocyte-Macrophage-Spinal Fluid 3 (*) 15 - 45 %   Eosinophils, CSF 0  0 - 1 %   Other Cells, CSF 1     Ct Head Wo Contrast  09/27/2013   CLINICAL DATA:  Fever. Headache. Neck pain. Prior history of viral meningitis.  EXAM: CT HEAD WITHOUT CONTRAST  TECHNIQUE: Contiguous axial images were obtained from the base of the skull through the vertex without intravenous contrast.  COMPARISON:  DG CERVICAL SPINE COMPLETE dated 05/23/2011; CT HEAD W/O CM dated 12/30/2009; MR HEAD WO/W CM dated 04/20/2007; CT HEAD W/O CM dated 04/20/2007  FINDINGS: Ventricular system normal in size and appearance for age. No mass lesion. No midline shift. No acute hemorrhage or hematoma. No extra-axial fluid collections. No evidence of acute infarction. No focal brain parenchymal abnormalities. Partial empty sella again noted. No significant interval change.  No focal osseous abnormalities involving the skull. Visualized paranasal sinuses, bilateral mastoid air cells, and bilateral middle ear cavities well-aerated.  IMPRESSION: No acute or significant intracranial  abnormality. Stable examination.   Electronically Signed   By: Hulan Saas M.D.   On: 09/27/2013 20:51     Imaging Review Ct Head Wo Contrast  09/27/2013   CLINICAL DATA:  Fever. Headache. Neck pain. Prior history of viral meningitis.  EXAM: CT HEAD WITHOUT CONTRAST  TECHNIQUE: Contiguous axial images were obtained from the base of the skull through the vertex without intravenous contrast.  COMPARISON:  DG CERVICAL SPINE COMPLETE dated 05/23/2011; CT HEAD W/O CM dated 12/30/2009; MR HEAD WO/W CM dated 04/20/2007; CT HEAD W/O CM dated 04/20/2007  FINDINGS: Ventricular system normal in size and appearance for age. No mass lesion. No midline shift. No acute hemorrhage or hematoma. No extra-axial fluid collections. No evidence of acute infarction. No focal brain parenchymal abnormalities. Partial empty sella again noted. No significant interval change.  No focal osseous abnormalities involving the skull. Visualized paranasal sinuses, bilateral mastoid air cells, and bilateral middle ear cavities well-aerated.  IMPRESSION: No acute or significant intracranial abnormality. Stable examination.   Electronically Signed   By: Hulan Saas M.D.   On: 09/27/2013 20:51    EKG Interpretation   None       MDM   Final diagnoses:  Meningitis    Pt presents with fever, headache, neck pain.  CSF consistent with meningitis, likely viral, but  will cover with abx until cultures come back.  Will consult hospitalist for admission.    Rolan BuccoMelanie Alakai Macbride, MD 09/28/13 0000

## 2013-09-27 NOTE — ED Notes (Signed)
Bed: [facility]18 Expected date:  Expected time:  Means of arrival:  Comments: Hold for triage 1 

## 2013-09-27 NOTE — ED Notes (Signed)
Patient transported to CT 

## 2013-09-28 ENCOUNTER — Encounter (HOSPITAL_COMMUNITY): Payer: Self-pay | Admitting: Internal Medicine

## 2013-09-28 DIAGNOSIS — G039 Meningitis, unspecified: Secondary | ICD-10-CM

## 2013-09-28 DIAGNOSIS — R509 Fever, unspecified: Secondary | ICD-10-CM

## 2013-09-28 LAB — URINALYSIS, ROUTINE W REFLEX MICROSCOPIC
Bilirubin Urine: NEGATIVE
GLUCOSE, UA: NEGATIVE mg/dL
Hgb urine dipstick: NEGATIVE
KETONES UR: NEGATIVE mg/dL
NITRITE: NEGATIVE
PH: 7.5 (ref 5.0–8.0)
Protein, ur: NEGATIVE mg/dL
SPECIFIC GRAVITY, URINE: 1.016 (ref 1.005–1.030)
Urobilinogen, UA: 0.2 mg/dL (ref 0.0–1.0)

## 2013-09-28 LAB — COMPREHENSIVE METABOLIC PANEL
ALT: 9 U/L (ref 0–35)
AST: 13 U/L (ref 0–37)
Albumin: 2.9 g/dL — ABNORMAL LOW (ref 3.5–5.2)
Alkaline Phosphatase: 67 U/L (ref 39–117)
BUN: 5 mg/dL — AB (ref 6–23)
CHLORIDE: 99 meq/L (ref 96–112)
CO2: 23 meq/L (ref 19–32)
CREATININE: 0.7 mg/dL (ref 0.50–1.10)
Calcium: 8.4 mg/dL (ref 8.4–10.5)
GFR calc Af Amer: >90 mL/min (ref >90)
Glucose, Bld: 113 mg/dL — ABNORMAL HIGH (ref 70–99)
POTASSIUM: 2.9 meq/L — AB (ref 3.7–5.3)
Sodium: 135 mEq/L — ABNORMAL LOW (ref 137–147)
Total Bilirubin: <0.2 mg/dL — ABNORMAL LOW (ref 0.3–1.2)
Total Protein: 6.7 g/dL (ref 6.0–8.3)

## 2013-09-28 LAB — MAGNESIUM: Magnesium: 1.8 mg/dL (ref 1.5–2.5)

## 2013-09-28 LAB — URINE MICROSCOPIC-ADD ON

## 2013-09-28 LAB — PROTEIN AND GLUCOSE, CSF
GLUCOSE CSF: 52 mg/dL (ref 43–76)
TOTAL PROTEIN, CSF: 51 mg/dL — AB (ref 15–45)

## 2013-09-28 LAB — INFLUENZA PANEL BY PCR (TYPE A & B)
H1N1FLUPCR: NOT DETECTED
INFLAPCR: NEGATIVE
INFLBPCR: NEGATIVE

## 2013-09-28 LAB — CBC
HCT: 31.1 % — ABNORMAL LOW (ref 36.0–46.0)
Hemoglobin: 9.6 g/dL — ABNORMAL LOW (ref 12.0–15.0)
MCH: 26 pg (ref 26.0–34.0)
MCHC: 30.9 g/dL (ref 30.0–36.0)
MCV: 84.3 fL (ref 78.0–100.0)
PLATELETS: 197 10*3/uL (ref 150–400)
RBC: 3.69 MIL/uL — ABNORMAL LOW (ref 3.87–5.11)
RDW: 15.3 % (ref 11.5–15.5)
WBC: 5.8 10*3/uL (ref 4.0–10.5)

## 2013-09-28 LAB — TSH: TSH: 1.026 u[IU]/mL (ref 0.350–4.500)

## 2013-09-28 LAB — PHOSPHORUS: Phosphorus: 3.4 mg/dL (ref 2.3–4.6)

## 2013-09-28 LAB — HERPES SIMPLEX VIRUS(HSV) DNA BY PCR
HSV 1 DNA: NOT DETECTED
HSV 2 DNA: NOT DETECTED

## 2013-09-28 LAB — PREGNANCY, URINE: PREG TEST UR: NEGATIVE

## 2013-09-28 LAB — POTASSIUM: Potassium: 3.6 mEq/L — ABNORMAL LOW (ref 3.7–5.3)

## 2013-09-28 MED ORDER — DIPHENHYDRAMINE HCL 25 MG PO CAPS
25.0000 mg | ORAL_CAPSULE | Freq: Four times a day (QID) | ORAL | Status: DC | PRN
  Administered 2013-09-28 – 2013-09-30 (×5): 25 mg via ORAL
  Filled 2013-09-28 (×5): qty 1

## 2013-09-28 MED ORDER — PROCHLORPERAZINE EDISYLATE 5 MG/ML IJ SOLN
10.0000 mg | Freq: Four times a day (QID) | INTRAMUSCULAR | Status: DC | PRN
  Administered 2013-09-28 – 2013-09-29 (×4): 10 mg via INTRAVENOUS
  Filled 2013-09-28 (×4): qty 2

## 2013-09-28 MED ORDER — HYDROCODONE-ACETAMINOPHEN 5-325 MG PO TABS
1.0000 | ORAL_TABLET | ORAL | Status: DC | PRN
Start: 2013-09-28 — End: 2013-09-30
  Administered 2013-09-28 – 2013-09-30 (×7): 2 via ORAL
  Filled 2013-09-28 (×7): qty 2

## 2013-09-28 MED ORDER — HYDROMORPHONE HCL PF 1 MG/ML IJ SOLN
1.0000 mg | INTRAMUSCULAR | Status: DC | PRN
  Administered 2013-09-28 – 2013-09-30 (×8): 1 mg via INTRAVENOUS
  Filled 2013-09-28 (×8): qty 1

## 2013-09-28 MED ORDER — SODIUM CHLORIDE 0.9 % IJ SOLN
3.0000 mL | Freq: Two times a day (BID) | INTRAMUSCULAR | Status: DC
Start: 2013-09-28 — End: 2013-09-30
  Administered 2013-09-28 – 2013-09-30 (×3): 3 mL via INTRAVENOUS

## 2013-09-28 MED ORDER — MORPHINE SULFATE 4 MG/ML IJ SOLN: 4.0000 mg | Freq: Once | INTRAMUSCULAR | Status: DC

## 2013-09-28 MED ORDER — POTASSIUM CHLORIDE 10 MEQ/100ML IV SOLN
10.0000 meq | Freq: Once | INTRAVENOUS | Status: AC
  Administered 2013-09-28: 10 meq via INTRAVENOUS
  Filled 2013-09-28: qty 100

## 2013-09-28 MED ORDER — DEXTROSE 5 % IV SOLN
620.0000 mg | Freq: Once | INTRAVENOUS | Status: AC
  Administered 2013-09-28: 620 mg via INTRAVENOUS
  Filled 2013-09-28: qty 12.4

## 2013-09-28 MED ORDER — ACETAMINOPHEN 325 MG PO TABS
650.0000 mg | ORAL_TABLET | Freq: Four times a day (QID) | ORAL | Status: DC | PRN
Start: 2013-09-28 — End: 2013-09-30

## 2013-09-28 MED ORDER — ONDANSETRON HCL 4 MG PO TABS: 4.0000 mg | ORAL_TABLET | Freq: Four times a day (QID) | ORAL | Status: DC | PRN

## 2013-09-28 MED ORDER — ONDANSETRON HCL 4 MG/2ML IJ SOLN: 4.0000 mg | Freq: Once | INTRAMUSCULAR | Status: DC

## 2013-09-28 MED ORDER — ACETAMINOPHEN 650 MG RE SUPP: 650.0000 mg | Freq: Four times a day (QID) | RECTAL | Status: DC | PRN

## 2013-09-28 MED ORDER — ONDANSETRON HCL 4 MG/2ML IJ SOLN
4.0000 mg | Freq: Four times a day (QID) | INTRAMUSCULAR | Status: DC | PRN
  Administered 2013-09-28 – 2013-09-30 (×5): 4 mg via INTRAVENOUS
  Filled 2013-09-28 (×5): qty 2

## 2013-09-28 MED ORDER — POTASSIUM CHLORIDE 10 MEQ/100ML IV SOLN
10.0000 meq | INTRAVENOUS | Status: AC
  Administered 2013-09-28 (×2): 10 meq via INTRAVENOUS
  Filled 2013-09-28 (×2): qty 100

## 2013-09-28 MED ORDER — DEXTROSE 5 % IV SOLN
620.0000 mg | Freq: Three times a day (TID) | INTRAVENOUS | Status: DC
  Administered 2013-09-28 – 2013-09-30 (×6): 620 mg via INTRAVENOUS
  Filled 2013-09-28 (×8): qty 12.4

## 2013-09-28 MED ORDER — DEXTROSE 5 % IV SOLN
2.0000 g | Freq: Two times a day (BID) | INTRAVENOUS | Status: DC
  Filled 2013-09-28 (×2): qty 2

## 2013-09-28 MED ORDER — SODIUM CHLORIDE 0.9 % IV SOLN
INTRAVENOUS | Status: AC
  Administered 2013-09-28 – 2013-09-29 (×2): via INTRAVENOUS

## 2013-09-28 MED ORDER — SODIUM CHLORIDE 0.9 % IV SOLN: INTRAVENOUS | Status: AC

## 2013-09-28 MED ORDER — DEXTROSE 5 % IV SOLN
2.0000 g | Freq: Two times a day (BID) | INTRAVENOUS | Status: DC
  Administered 2013-09-28 – 2013-09-29 (×3): 2 g via INTRAVENOUS
  Filled 2013-09-28 (×4): qty 2

## 2013-09-28 MED ORDER — VANCOMYCIN HCL IN DEXTROSE 1-5 GM/200ML-% IV SOLN
1000.0000 mg | Freq: Three times a day (TID) | INTRAVENOUS | Status: AC
  Administered 2013-09-28 – 2013-09-29 (×4): 1000 mg via INTRAVENOUS
  Filled 2013-09-28 (×5): qty 200

## 2013-09-28 MED ORDER — DOCUSATE SODIUM 100 MG PO CAPS
100.0000 mg | ORAL_CAPSULE | Freq: Two times a day (BID) | ORAL | Status: DC
  Administered 2013-09-28: 100 mg via ORAL
  Filled 2013-09-28 (×7): qty 1

## 2013-09-28 NOTE — H&P (Signed)
PCP:  Georgianne Fick, MD    Chief Complaint:  headache  HPI: [name] is a 33 y.o. female   has a past medical history of Migraine; Anemia; and Viral meningitis.   Presented with  Patient has hx of recurrent viral meningitis. She started to have headache and back pain 2 days ago. She developed head pressure, stiffness in her neck and back and severe headache. This is very similar to prior episodes.  Patient in the past was told that she may have herpes and was started on daily valtrex. Her fever reached 102 and she presented to ER. LP was done and showed WBC 190 predominately lymphs with no organisms on smear.  Hospitalist called for admission.  Review of Systems:    Pertinent positives include: headaches, Fevers, chills, fatigue, myalgia  Constitutional:  No weight loss, night sweats,  weight loss  HEENT:  No Difficulty swallowing,Tooth/dental problems,Sore throat,  No sneezing, itching, ear ache, nasal congestion, post nasal drip,  Cardio-vascular:  No chest pain, Orthopnea, PND, anasarca, dizziness, palpitations.no Bilateral lower extremity swelling  GI:  No heartburn, indigestion, abdominal pain, nausea, vomiting, diarrhea, change in bowel habits, loss of appetite, melena, blood in stool, hematemesis Resp:  no shortness of breath at rest. No dyspnea on exertion, No excess mucus, no productive cough, No non-productive cough, No coughing up of blood.No change in color of mucus . No wheezing. Skin:  no rash or lesions. No jaundice GU:  no dysuria, change in color of urine, no urgency or frequency. No straining to urinate.  No flank pain.  Musculoskeletal:  No joint pain or no joint swelling. No decreased range of motion. No back pain.  Psych:  No change in mood or affect. No depression or anxiety. No memory loss.  Neuro: no localizing neurological complaints, no tingling, no weakness, no double vision, no gait abnormality, no slurred speech, no  confusion  Otherwise ROS are negative except for above, 10 systems were reviewed  Past Medical History: Past Medical History  Diagnosis Date  . Migraine   . Anemia   . Viral meningitis    Past Surgical History  Procedure Laterality Date  . Cesarean section N/A 06/15/2013    Procedure: CESAREAN SECTION;  Surgeon: Philip Aspen, DO;  Location: [facility];  Service: Obstetrics;  Laterality: N/A;     Medications: Prior to Admission medications   Medication Sig Start Date End Date Taking? Authorizing Provider  valACYclovir (VALTREX) 500 MG tablet Take 1,000 mg by mouth at bedtime.     Yes Historical Provider, MD    Allergies:   Allergies  Allergen Reactions  . Citric Acid     Burns inside of mouth  . Latex Rash  . Penicillins Cross Reactors Hives and Rash    Social History:  Ambulatory   independently   Lives at  Home with 1 month old and 68 year old infants   reports that she has never smoked. She has never used smokeless tobacco. She reports that she does not drink alcohol or use illicit drugs.   Family History: family history includes Alcoholism in her father; Cirrhosis in her father.    Physical Exam: Patient Vitals for the past 24 hrs:  BP Temp Temp src Pulse Resp SpO2  09/27/13 2249 131/77 mmHg - - 78 16 100 %  09/27/13 1828 142/90 mmHg 100 F (37.8 C) Oral 95 - 99 %    1. General:  in No Acute distress 2. Psychological: Alert and  Oriented 3. Head/ENT:  Moist   Mucous Membranes                          Head Non traumatic, neck stiff                          Normal   Dentition 4. SKIN: decreased Skin turgor,  Skin clean Dry and intact no rash 5. Heart: Regular rate and rhythm no Murmur, Rub or gallop 6. Lungs: Clear to auscultation bilaterally, no wheezes or crackles   7. Abdomen: Soft, non-tender, Non distended 8. Lower extremities: no clubbing, cyanosis, or edema 9. Neurologically Grossly intact, moving all 4 extremities equally 10. MSK: Normal range of  motion  body mass index is unknown because there is no weight on file.   Labs on Admission:   Recent Labs  09/27/13 1810  NA 138  K 3.3*  CL 100  CO2 26  GLUCOSE 84  BUN 6  CREATININE 0.81  CALCIUM 9.3    Recent Labs  09/27/13 1810  AST 17  ALT 13  ALKPHOS 83  BILITOT <0.2*  PROT 8.0  ALBUMIN 3.6   No results found for this basename: LIPASE, AMYLASE,  in the last 72 hours  Recent Labs  09/27/13 1810  WBC 6.0  NEUTROABS 2.9  HGB 11.6*  HCT 36.1  MCV 84.0  PLT 245   No results found for this basename: CKTOTAL, CKMB, CKMBINDEX, TROPONINI,  in the last 72 hours No results found for this basename: TSH, T4TOTAL, FREET3, T3FREE, THYROIDAB,  in the last 72 hours No results found for this basename: VITAMINB12, FOLATE, FERRITIN, TIBC, IRON, RETICCTPCT,  in the last 72 hours No results found for this basename: HGBA1C    The CrCl is unknown because both a height and weight (above a minimum accepted value) are required for this calculation. ABG No results found for this basename: phart, pco2, po2, hco3, tco2, acidbasedef, o2sat     No results found for this basename: DDIMER   UA no evidence of infection  Cultures:    Component Value Date/Time   SDES CSF 09/27/2013 2216   SPECREQUEST NONE 09/27/2013 2216   CULT  Value: STAPHYLOCOCCUS SPECIES (COAGULASE NEGATIVE) Note: RIFAMPIN AND GENTAMICIN SHOULD NOT BE USED AS SINGLE DRUGS FOR TREATMENT OF STAPH INFECTIONS. 12/06/2012 1710   REPTSTATUS 09/27/2013 FINAL 09/27/2013 2216       Radiological Exams on Admission: Ct Head Wo Contrast  09/27/2013   CLINICAL DATA:  Fever. Headache. Neck pain. Prior history of viral meningitis.  EXAM: CT HEAD WITHOUT CONTRAST  TECHNIQUE: Contiguous axial images were obtained from the base of the skull through the vertex without intravenous contrast.  COMPARISON:  DG CERVICAL SPINE COMPLETE dated 05/23/2011; CT HEAD W/O CM dated 12/30/2009; MR HEAD WO/W CM dated 04/20/2007; CT HEAD W/O CM dated  04/20/2007  FINDINGS: Ventricular system normal in size and appearance for age. No mass lesion. No midline shift. No acute hemorrhage or hematoma. No extra-axial fluid collections. No evidence of acute infarction. No focal brain parenchymal abnormalities. Partial empty sella again noted. No significant interval change.  No focal osseous abnormalities involving the skull. Visualized paranasal sinuses, bilateral mastoid air cells, and bilateral middle ear cavities well-aerated.  IMPRESSION: No acute or significant intracranial abnormality. Stable examination.   Electronically Signed   By: Hulan Saashomas  Lawrence M.D.   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   Present on Admission:  . Meningitis  Admit to tele  For careful monitoring, while cultures are pending will cover with broad spectrum antibiotics and add acyclovir. ID consult in AM, HSV PCR, droplet precautions. Patient does not appear toxic   Prophylaxis: SCD    CODE STATUS: FULL CODE  Other plan as per orders.  I have spent a total of 55 min on this admission  [name] 09/28/2013, 12:38 AM

## 2013-09-28 NOTE — Progress Notes (Signed)
ANTIBIOTIC CONSULT NOTE - INITIAL  Pharmacy Consult for Vancomycin, acyclovir Indication: Meningitis  Allergies  Allergen Reactions  . Citric Acid     Burns inside of mouth  . Latex Rash  . Penicillins Cross Reactors Hives and Rash    Patient Measurements: Height: 5\' 7"  (170.2 cm) Weight: 164 lb 4.8 oz (74.526 kg) IBW/kg (Calculated) : 61.6 Adjusted Body Weight:   Vital Signs: Temp: 98.6 F (37 C) (02/21 0421) Temp src: Oral (02/21 0421) BP: 122/74 mmHg (02/21 0421) Pulse Rate: 77 (02/21 0421) Intake/Output from previous day:   Intake/Output from this shift:    Labs:  Recent Labs  09/27/13 1810 09/28/13 0431  WBC 6.0 5.8  HGB 11.6* 9.6*  PLT 245 197  CREATININE 0.81 0.70   Estimated Creatinine Clearance: 108.4 ml/min (by C-G formula based on Cr of 0.7). No results found for this basename: VANCOTROUGH, VANCOPEAK, VANCORANDOM, GENTTROUGH, GENTPEAK, GENTRANDOM, TOBRATROUGH, TOBRAPEAK, TOBRARND, AMIKACINPEAK, AMIKACINTROU, AMIKACIN,  in the last 72 hours   Microbiology: Recent Results (from the past 720 hour(s))  GRAM STAIN     Status: None   Collection Time    09/27/13 10:16 PM      Result Value Ref Range Status   Specimen Description CSF   Final   Special Requests NONE   Final   Gram Stain     Final   Value: NO ORGANISMS SEEN     WBC PRESENT, PREDOMINANTLY MONONUCLEAR     CYTOSPIN PREP     Gram Stain Report Called to,Read Back By and Verified With: SHELL,A/ED @2338  ON 09/27/13 BY KARCZEWSKI,S.   Report Status 09/27/2013 FINAL   Final    Medical History: Past Medical History  Diagnosis Date  . Migraine   . Anemia   . Viral meningitis     Medications:  Anti-infectives   Start     Dose/Rate Route Frequency Ordered Stop   09/28/13 1200  acyclovir (ZOVIRAX) 620 mg in dextrose 5 % 100 mL IVPB     620 mg 112.4 mL/hr over 60 Minutes Intravenous 3 times per day 09/28/13 0531     09/28/13 1000  cefTRIAXone (ROCEPHIN) 2 g in dextrose 5 % 50 mL IVPB     2  g 100 mL/hr over 30 Minutes Intravenous Every 12 hours 09/28/13 0214     09/28/13 0600  vancomycin (VANCOCIN) IVPB 1000 mg/200 mL premix     1,000 mg 200 mL/hr over 60 Minutes Intravenous Every 8 hours 09/28/13 0240     09/28/13 0215  cefTRIAXone (ROCEPHIN) 2 g in dextrose 5 % 50 mL IVPB  Status:  Discontinued     2 g 100 mL/hr over 30 Minutes Intravenous Every 12 hours 09/28/13 0203 09/28/13 0214   09/28/13 0030  acyclovir (ZOVIRAX) 620 mg in dextrose 5 % 100 mL IVPB     620 mg 112.4 mL/hr over 60 Minutes Intravenous  Once 09/28/13 0021 09/28/13 0405   09/28/13 0000  cefTRIAXone (ROCEPHIN) 2 g in dextrose 5 % 50 mL IVPB     2 g 100 mL/hr over 30 Minutes Intravenous  Once 09/27/13 2350 09/28/13 0056   09/28/13 0000  vancomycin (VANCOCIN) IVPB 1000 mg/200 mL premix     1,000 mg 200 mL/hr over 60 Minutes Intravenous  Once 09/27/13 2350 09/28/13 0157     Assessment: Patient with meningitis.  First dose of antibiotics already given.   Goal of Therapy:  Vancomycin trough level 15-20 mcg/ml Acyclovir 10mg /kg iv q8hr (IBW)  Plan:  Measure antibiotic  drug levels at steady state Follow up culture results Vancomycin 1gm iv q8hr Acyclovir 620mg  iv q8hr  [name], [name] 09/28/2013,5:53 AM

## 2013-09-28 NOTE — Progress Notes (Signed)
Called for report on this patient, ED RN currently taking report on another patient and will call back.

## 2013-09-28 NOTE — Progress Notes (Signed)
Pt seen and examined. H and P from this AM reviewed. Agree with assessment and plan. Pt presents with headache associated with stiff neck. CSF with mildly elevated WBC and protein suggestive of aseptic meningitis. CSF cx thus far w/o growth. Currently continued on empiric vanc, rocephin, and acyclovir. Currently afebrile without leukocytosis. Cont course for now.

## 2013-09-28 NOTE — Progress Notes (Signed)
CRITICAL VALUE ALERT  Critical value received:  K 2.9  Date of notification:  09/28/13  Time of notification:  5:49 AM  Critical value read back:yes  Nurse who received alert:  Silvestre GunnerAmanda Shahil Speegle, RN  MD notified (1st page):  Everett Graffhomas Callahan, NP  Time of first page:  5:50 AM  MD notified (2nd page):  Time of second page:  Responding MD:  Everett Graffhomas Callahan, NP  Time MD responded:  5:54 AM

## 2013-09-29 DIAGNOSIS — A879 Viral meningitis, unspecified: Principal | ICD-10-CM

## 2013-09-29 LAB — BASIC METABOLIC PANEL
BUN: 3 mg/dL — ABNORMAL LOW (ref 6–23)
CALCIUM: 8.7 mg/dL (ref 8.4–10.5)
CO2: 23 mEq/L (ref 19–32)
Chloride: 104 mEq/L (ref 96–112)
Creatinine, Ser: 0.66 mg/dL (ref 0.50–1.10)
GFR calc Af Amer: >90 mL/min (ref >90)
GFR calc non Af Amer: >90 mL/min (ref >90)
GLUCOSE: 93 mg/dL (ref 70–99)
Potassium: 3.4 mEq/L — ABNORMAL LOW (ref 3.7–5.3)
SODIUM: 139 meq/L (ref 137–147)

## 2013-09-29 LAB — CBC WITH DIFFERENTIAL/PLATELET
BASOS ABS: 0 10*3/uL (ref 0.0–0.1)
BASOS PCT: 0 % (ref 0–1)
EOS ABS: 0.1 10*3/uL (ref 0.0–0.7)
EOS PCT: 2 % (ref 0–5)
HCT: 30.3 % — ABNORMAL LOW (ref 36.0–46.0)
Hemoglobin: 9.7 g/dL — ABNORMAL LOW (ref 12.0–15.0)
Lymphocytes Relative: 36 % (ref 12–46)
Lymphs Abs: 1.5 10*3/uL (ref 0.7–4.0)
MCH: 26.6 pg (ref 26.0–34.0)
MCHC: 32 g/dL (ref 30.0–36.0)
MCV: 83.2 fL (ref 78.0–100.0)
Monocytes Absolute: 0.4 10*3/uL (ref 0.1–1.0)
Monocytes Relative: 9 % (ref 3–12)
NEUTROS PCT: 53 % (ref 43–77)
Neutro Abs: 2.2 10*3/uL (ref 1.7–7.7)
PLATELETS: 185 10*3/uL (ref 150–400)
RBC: 3.64 MIL/uL — ABNORMAL LOW (ref 3.87–5.11)
RDW: 15 % (ref 11.5–15.5)
WBC: 4.2 10*3/uL (ref 4.0–10.5)

## 2013-09-29 MED ORDER — VANCOMYCIN HCL 1000 MG IV SOLR
1000.0000 mg | Freq: Three times a day (TID) | INTRAVENOUS | Status: DC
  Filled 2013-09-29 (×2): qty 1000

## 2013-09-29 MED ORDER — KETOROLAC TROMETHAMINE 15 MG/ML IJ SOLN
15.0000 mg | Freq: Four times a day (QID) | INTRAMUSCULAR | Status: DC | PRN
  Administered 2013-09-29 – 2013-09-30 (×4): 15 mg via INTRAVENOUS
  Filled 2013-09-29 (×4): qty 1

## 2013-09-29 NOTE — Progress Notes (Signed)
[facility] PROGRESS NOTE  [name] ZOX:[id number] DOB: 05/14/1981 DOA: 09/27/2013 PCP: Georgianne Fick, MD  Assessment/Plan: 1. Aseptic meningitis 1. SCF studies suggestive of aseptic meningitis 2. CSF without growth 3. Afebrile, no leukocytosis 4. Presently continued on van/rocephin/acyclovir 5. Consider de-escalating abx to acyclovir monotherapy 2. Anemia 1. willl monitor 2. No need for tx 3. Hypokalemia 1. Replaced 2. Will follow lytes and replace as needed 4. DVT prophylaxis 1. SCD's  Code Status: Full Family Communication: Pt in room (indicate person spoken with, relationship, and if by phone, the number) Disposition Plan: Pending   Consultants:    Procedures:  Bedside LP in ED   Antibiotics:  Rocephin 09/28/13>>>09/29/13  Vancomycin 09/28/13>>>09/29/13  Acyclovir 09/28/13>>>  HPI/Subjective: Reports feeling better. Still complains of headache, improving.  Objective: Filed Vitals:   09/28/13 1516 09/28/13 2109 09/29/13 0159 09/29/13 0558  BP: 110/72 129/78 123/70 126/81  Pulse: 80 81 72 73  Temp: 98.4 F (36.9 C) 97.5 F (36.4 C) 97.7 F (36.5 C) 98.7 F (37.1 C)  TempSrc: Oral Oral Oral Oral  Resp: 16 18 18 18   Height:      Weight:      SpO2: 100% 98% 100% 100%    Intake/Output Summary (Last 24 hours) at 09/29/13 0834 Last data filed at 09/29/13 0600  Gross per 24 hour  Intake 3595.53 ml  Output      1 ml  Net 3594.53 ml   Filed Weights   09/27/13 2300 09/28/13 0043 09/28/13 0221  Weight: 71.668 kg (158 lb) 71.668 kg (158 lb) 74.526 kg (164 lb 4.8 oz)    Exam:   General:  Awake, in nad  Cardiovascular: regular, s1, s2  Respiratory: normal resp effort, no wheezing  Abdomen: soft, nondistended  Musculoskeletal: perfused, no clubbing   Data Reviewed: Basic Metabolic Panel:  Recent Labs Lab 09/27/13 1810 09/28/13 0431 09/28/13 0845  NA 138 135*  --   K 3.3* 2.9* 3.6*  CL 100 99  --   CO2 26 23  --    GLUCOSE 84 113*  --   BUN 6 5*  --   CREATININE 0.81 0.70  --   CALCIUM 9.3 8.4  --   MG  --  1.8  --   PHOS  --  3.4  --    Liver Function Tests:  Recent Labs Lab 09/27/13 1810 09/28/13 0431  AST 17 13  ALT 13 9  ALKPHOS 83 67  BILITOT <0.2* <0.2*  PROT 8.0 6.7  ALBUMIN 3.6 2.9*   No results found for this basename: LIPASE, AMYLASE,  in the last 168 hours No results found for this basename: AMMONIA,  in the last 168 hours CBC:  Recent Labs Lab 09/27/13 1810 09/28/13 0431  WBC 6.0 5.8  NEUTROABS 2.9  --   HGB 11.6* 9.6*  HCT 36.1 31.1*  MCV 84.0 84.3  PLT 245 197   Cardiac Enzymes: No results found for this basename: CKTOTAL, CKMB, CKMBINDEX, TROPONINI,  in the last 168 hours BNP (last 3 results) No results found for this basename: PROBNP,  in the last 8760 hours CBG: No results found for this basename: GLUCAP,  in the last 168 hours  Recent Results (from the past 240 hour(s))  CSF CULTURE     Status: None   Collection Time    09/27/13 10:16 PM      Result Value Ref Range Status   Specimen Description CSF   Final   Special Requests Normal   Final  Gram Stain     Final   Value: CYTOSPIN WBC PRESENT, PREDOMINANTLY MONONUCLEAR     NO ORGANISMS SEEN     [facility] Gram Stain Report Called to,Read Back By and Verified With: Gram Stain Report Called to,Read Back By and Verified With:  SHELL A/ED AT 2338 ON 09/27/13 BY KARCZEWSKI S     [facility] at [facility]   Culture     Final   Value: NO GROWTH     [facility]formed at [facility]   Report Status PENDING   Incomplete  GRAM STAIN     Status: None   Collection Time    09/27/13 10:16 PM      Result Value Ref Range Status   Specimen Description CSF   Final   Special Requests NONE   Final   Gram Stain     Final   Value: NO ORGANISMS SEEN     WBC PRESENT, PREDOMINANTLY MONONUCLEAR     CYTOSPIN PREP     Gram Stain Report Called to,Read Back By and Verified With: SHELL,A/ED  @2338  ON 09/27/13 BY KARCZEWSKI,S.   Report Status 09/27/2013 FINAL   Final     Studies: Ct Head Wo Contrast  09/27/2013   CLINICAL DATA:  Fever. Headache. Neck pain. Prior history of viral meningitis.  EXAM: CT HEAD WITHOUT CONTRAST  TECHNIQUE: Contiguous axial images were obtained from the base of the skull through the vertex without intravenous contrast.  COMPARISON:  DG CERVICAL SPINE COMPLETE dated 05/23/2011; CT HEAD W/O CM dated 12/30/2009; MR HEAD WO/W CM dated 04/20/2007; CT HEAD W/O CM dated 04/20/2007  FINDINGS: Ventricular system normal in size and appearance for age. No mass lesion. No midline shift. No acute hemorrhage or hematoma. No extra-axial fluid collections. No evidence of acute infarction. No focal brain parenchymal abnormalities. Partial empty sella again noted. No significant interval change.  No focal osseous abnormalities involving the skull. Visualized paranasal sinuses, bilateral mastoid air cells, and bilateral middle ear cavities well-aerated.  IMPRESSION: No acute or significant intracranial abnormality. Stable examination.   Electronically Signed   By: Hulan Saashomas  Lawrence M.D.   On: 09/27/2013 20:51    Scheduled Meds: . acyclovir  620 mg Intravenous 3 times per day  . cefTRIAXone (ROCEPHIN)  IV  2 g Intravenous Q12H  . docusate sodium  100 mg Oral BID  . ondansetron  4 mg Intravenous Once  . sodium chloride  3 mL Intravenous Q12H  . vancomycin (VANCOCIN) 1000 mg IVPB  1,000 mg Intravenous 3 times per day   Continuous Infusions: . sodium chloride 100 mL/hr at 09/29/13 [id number]    Active Problems:   Meningitis  Time spent: 35min  Brunella Wileman K  [facility] Pager [phone]. If 7PM-7AM, please contact night-coverage at [email or web address], password [email or web address] 09/29/2013, 8:34 AM  LOS: 2 days

## 2013-09-29 NOTE — Progress Notes (Signed)
Nutrition Brief Note  Patient identified on the Malnutrition Screening Tool (MST) Report  Wt Readings from Last 15 Encounters:  09/28/13 164 lb 4.8 oz (74.526 kg)  06/15/13 197 lb (89.359 kg)  06/15/13 197 lb (89.359 kg)  12/06/12 163 lb (73.936 kg)  06/03/12 163 lb (73.936 kg)  07/05/11 157 lb (71.215 kg)  05/23/11 156 lb (70.761 kg)    Body mass index is 25.73 kg/(m^2). Patient meets criteria for overweight based on current BMI.   Current diet order is regular. Labs and medications reviewed.   Pt's husband is bringing her food to eat. She feels that she is eating better, and that she does not need any nutritional supplements at this time.  No nutrition interventions warranted at this time. If nutrition issues arise, please consult RD.   Ebbie LatusHaley Hawkins RD, LDN

## 2013-09-30 LAB — CBC
HCT: 31.5 % — ABNORMAL LOW (ref 36.0–46.0)
Hemoglobin: 9.8 g/dL — ABNORMAL LOW (ref 12.0–15.0)
MCH: 26.1 pg (ref 26.0–34.0)
MCHC: 31.1 g/dL (ref 30.0–36.0)
MCV: 84 fL (ref 78.0–100.0)
Platelets: 180 10*3/uL (ref 150–400)
RBC: 3.75 MIL/uL — ABNORMAL LOW (ref 3.87–5.11)
RDW: 15.1 % (ref 11.5–15.5)
WBC: 4.2 10*3/uL (ref 4.0–10.5)

## 2013-09-30 LAB — PATHOLOGIST SMEAR REVIEW

## 2013-09-30 MED ORDER — KETOROLAC TROMETHAMINE 10 MG PO TABS: 10.0000 mg | ORAL_TABLET | Freq: Four times a day (QID) | ORAL | Status: DC | PRN

## 2013-09-30 MED ORDER — ONDANSETRON HCL 4 MG PO TABS
4.0000 mg | ORAL_TABLET | Freq: Four times a day (QID) | ORAL | Status: DC | PRN
Start: 2013-09-30 — End: 2014-11-05

## 2013-09-30 NOTE — Care Management Note (Signed)
    Page 1 of 1   09/30/2013     1:22:12 PM   CARE MANAGEMENT NOTE 09/30/2013  Patient:  [name],[name]   Account Number:  [id number]  Date Initiated:  09/30/2013  Documentation initiated by:  [facility]Caspar Favila  Subjective/Objective Assessment:   33 [name]/O F ADMITTED W/VIRAL MENINGITIS.     Action/Plan:   FROM HOME.   Anticipated DC Date:  09/30/2013   Anticipated DC Plan:  HOME/SELF CARE      DC Planning Services  CM consult      Choice offered to / List presented to:             Status of service:  Completed, signed off Medicare Important Message given?   (If response is "NO", the following Medicare IM given date fields will be blank) Date Medicare IM given:   Date Additional Medicare IM given:    Discharge Disposition:  HOME/SELF CARE  Per UR Regulation:  Reviewed for med. necessity/level of care/duration of stay  If discussed at Long Length of Stay Meetings, dates discussed:    Comments:  09/30/13 [facility] Radford Pease RN,BSN NCM [phone]

## 2013-09-30 NOTE — Discharge Summary (Addendum)
Physician Discharge Summary  Anita BenDanielle Y Bronder ZOX:[id number] DOB: 10-Aug-1980 DOA: 09/27/2013  PCP: Georgianne FickAMACHANDRAN,AJITH, MD  Admit date: 09/27/2013 Discharge date: 09/30/2013  Time spent: 3227m minutes  Recommendations for Outpatient Follow-up:  1. Follow up with PCP in 1-2 weeks  Discharge Diagnoses:  Principal Problem:   Meningitis   Discharge Condition: Improved  Diet recommendation: Regular  Filed Weights   09/27/13 2300 09/28/13 0043 09/28/13 0221  Weight: 71.668 kg (158 lb) 71.668 kg (158 lb) 74.526 kg (164 lb 4.8 oz)    History of present illness:  [name] is a 33 y.o. female  has a past medical history of Migraine; Anemia; and Viral meningitis.  Presented with  Patient has hx of recurrent viral meningitis. She started to have headache and back pain 2 days ago. She developed head pressure, stiffness in her neck and back and severe headache. This is very similar to prior episodes.  Patient in the past was told that she may have herpes and was started on daily valtrex. Her fever reached 102 and she presented to ER. LP was done and showed WBC 190 predominately lymphs with no organisms on smear.  Hospitalist called for admission.  Hospital Course:  1. Aseptic meningitis  1. SCF studies suggestive of aseptic meningitis 2. CSF without growth 3. CSF neg for herpes 4. Afebrile, no leukocytosis 5. Was continued on van/rocephin/acyclovir 6. As cultures are negative, would hold further antimicrobials  2. Anemia  1. Stable 2. No need for tx 3. Hypokalemia  1. Replaced 4. DVT prophylaxis  1. SCD's  Procedures:  Bedside LP in ED  Discharge Exam: Filed Vitals:   09/29/13 1421 09/29/13 2153 09/30/13 0516 09/30/13 1007  BP: 117/63 127/60 130/85 128/79  Pulse: 66 78 71 61  Temp: 98.6 F (37 C) 97.7 F (36.5 C) 97.6 F (36.4 C) 98.3 F (36.8 C)  TempSrc: Oral Oral Oral Oral  Resp: 16 20 18 18   Height:      Weight:      SpO2: 98% 99% 99% 96%    General:  Awake, in nad Cardiovascular: regular, s1, s2 Respiratory: normal resp effort, no wheezing  Discharge Instructions     Medication List    STOP taking these medications       valACYclovir 500 MG tablet  Commonly known as:  VALTREX      TAKE these medications       ketorolac 10 MG tablet  Commonly known as:  TORADOL  Take 1 tablet (10 mg total) by mouth every 6 (six) hours as needed for moderate pain or severe pain.     ondansetron 4 MG tablet  Commonly known as:  ZOFRAN  Take 1 tablet (4 mg total) by mouth every 6 (six) hours as needed for nausea.       Allergies  Allergen Reactions  . Citric Acid     Burns inside of mouth  . Latex Rash  . Penicillins Cross Reactors Hives and Rash   Follow-up Information   [facility] Schedule an appointment as soon as possible for a visit in 1 week.   Specialty:  Internal Medicine   Contact information:   [address] [place] [address] [phone]       The results of significant diagnostics from this hospitalization (including imaging, microbiology, ancillary and laboratory) are listed below for reference.    Significant Diagnostic Studies: Ct Head Wo Contrast  09/27/2013   CLINICAL DATA:  Fever. Headache. Neck pain.  Prior history of viral meningitis.  EXAM: CT HEAD WITHOUT CONTRAST  TECHNIQUE: Contiguous axial images were obtained from the base of the skull through the vertex without intravenous contrast.  COMPARISON:  DG CERVICAL SPINE COMPLETE dated 05/23/2011; CT HEAD W/O CM dated 12/30/2009; MR HEAD WO/W CM dated 04/20/2007; CT HEAD W/O CM dated 04/20/2007  FINDINGS: Ventricular system normal in size and appearance for age. No mass lesion. No midline shift. No acute hemorrhage or hematoma. No extra-axial fluid collections. No evidence of acute infarction. No focal brain parenchymal abnormalities. Partial empty sella again noted. No significant interval change.  No focal osseous  abnormalities involving the skull. Visualized paranasal sinuses, bilateral mastoid air cells, and bilateral middle ear cavities well-aerated.  IMPRESSION: No acute or significant intracranial abnormality. Stable examination.   Electronically Signed   By: Hulan Saas M.D.   On: 09/27/2013 20:51    Microbiology: Recent Results (from the past 240 hour(s))  CULTURE, BLOOD (ROUTINE X 2)     Status: None   Collection Time    09/27/13  8:05 PM      Result Value Ref Range Status   Specimen Description BLOOD RIGHT ANTECUBITAL   Final   Special Requests BOTTLES DRAWN AEROBIC AND ANAEROBIC 5CC   Final   Culture  Setup Time     Final   Value: 09/28/2013 00:32     Performed at [facility]   Culture     Final   Value:        BLOOD CULTURE RECEIVED NO GROWTH TO DATE CULTURE WILL BE HELD FOR 5 DAYS BEFORE ISSUING A FINAL NEGATIVE REPORT     Performed at [facility]   Report Status PENDING   Incomplete  CULTURE, BLOOD (ROUTINE X 2)     Status: None   Collection Time    09/27/13  8:10 PM      Result Value Ref Range Status   Specimen Description BLOOD LEFT ANTECUBITAL   Final   Special Requests BOTTLES DRAWN AEROBIC AND ANAEROBIC 5CC   Final   Culture  Setup Time     Final   Value: 09/28/2013 00:32     Performed at [facility]   Culture     Final   Value:        BLOOD CULTURE RECEIVED NO GROWTH TO DATE CULTURE WILL BE HELD FOR 5 DAYS BEFORE ISSUING A FINAL NEGATIVE REPORT     Performed at [facility]   Report Status PENDING   Incomplete  CSF CULTURE     Status: None   Collection Time    09/27/13 10:16 PM      Result Value Ref Range Status   Specimen Description CSF   Final   Special Requests Normal   Final   Gram Stain     Final   Value: CYTOSPIN WBC PRESENT, PREDOMINANTLY MONONUCLEAR     NO ORGANISMS SEEN     Performed by [facility] Gram Stain Report Called to,Read Back By and Verified With: Gram Stain Report Called to,Read Back By and Verified  With:  SHELL A/ED AT 2338 ON 09/27/13 BY KARCZEWSKI S     Performed at [facility]   Culture     Final   Value: NO GROWTH 2 DAYS     Performed at [facility]   Report Status PENDING   Incomplete  GRAM STAIN     Status: None   Collection Time    09/27/13  10:16 PM      Result Value Ref Range Status   Specimen Description CSF   Final   Special Requests NONE   Final   Gram Stain     Final   Value: NO ORGANISMS SEEN     WBC PRESENT, PREDOMINANTLY MONONUCLEAR     CYTOSPIN PREP     Gram Stain Report Called to,Read Back By and Verified With: SHELL,A/ED @2338  ON 09/27/13 BY KARCZEWSKI,S.   Report Status 09/27/2013 FINAL   Final     Labs: Basic Metabolic Panel:  Recent Labs Lab 09/27/13 1810 09/28/13 0431 09/28/13 0845 09/29/13 1030  NA 138 135*  --  139  K 3.3* 2.9* 3.6* 3.4*  CL 100 99  --  104  CO2 26 23  --  23  GLUCOSE 84 113*  --  93  BUN 6 5*  --  3*  CREATININE 0.81 0.70  --  0.66  CALCIUM 9.3 8.4  --  8.7  MG  --  1.8  --   --   PHOS  --  3.4  --   --    Liver Function Tests:  Recent Labs Lab 09/27/13 1810 09/28/13 0431  AST 17 13  ALT 13 9  ALKPHOS 83 67  BILITOT <0.2* <0.2*  PROT 8.0 6.7  ALBUMIN 3.6 2.9*   No results found for this basename: LIPASE, AMYLASE,  in the last 168 hours No results found for this basename: AMMONIA,  in the last 168 hours CBC:  Recent Labs Lab 09/27/13 1810 09/28/13 0431 09/29/13 1030 09/30/13 0459  WBC 6.0 5.8 4.2 4.2  NEUTROABS 2.9  --  2.2  --   HGB 11.6* 9.6* 9.7* 9.8*  HCT 36.1 31.1* 30.3* 31.5*  MCV 84.0 84.3 83.2 84.0  PLT 245 197 185 180   Cardiac Enzymes: No results found for this basename: CKTOTAL, CKMB, CKMBINDEX, TROPONINI,  in the last 168 hours BNP: BNP (last 3 results) No results found for this basename: PROBNP,  in the last 8760 hours CBG: No results found for this basename: GLUCAP,  in the last 168 hours  Signed:  CHIU, STEPHEN K  [facility] 09/30/2013, 11:55 AM

## 2013-10-01 LAB — CSF CULTURE: CULTURE: NO GROWTH

## 2013-10-01 LAB — CSF CULTURE W GRAM STAIN: Special Requests: NORMAL

## 2013-10-04 LAB — CULTURE, BLOOD (ROUTINE X 2)
CULTURE: NO GROWTH
Culture: NO GROWTH

## 2014-06-09 ENCOUNTER — Encounter (HOSPITAL_COMMUNITY): Payer: Self-pay | Admitting: Internal Medicine

## 2014-09-24 ENCOUNTER — Emergency Department (HOSPITAL_COMMUNITY): Payer: BLUE CROSS/BLUE SHIELD

## 2014-09-24 ENCOUNTER — Encounter (HOSPITAL_COMMUNITY): Payer: Self-pay | Admitting: Emergency Medicine

## 2014-09-24 ENCOUNTER — Emergency Department (HOSPITAL_COMMUNITY)
Admission: EM | Admit: 2014-09-24 | Discharge: 2014-09-24 | Disposition: A | Discharge status: Home / Self Care | Payer: BLUE CROSS/BLUE SHIELD | Attending: Emergency Medicine | Admitting: Emergency Medicine

## 2014-09-24 DIAGNOSIS — R509 Fever, unspecified: Secondary | ICD-10-CM | POA: Diagnosis not present

## 2014-09-24 DIAGNOSIS — Z862 Personal history of diseases of the blood and blood-forming organs and certain disorders involving the immune mechanism: Secondary | ICD-10-CM | POA: Diagnosis not present

## 2014-09-24 DIAGNOSIS — H9209 Otalgia, unspecified ear: Secondary | ICD-10-CM | POA: Diagnosis not present

## 2014-09-24 DIAGNOSIS — R51 Headache: Secondary | ICD-10-CM | POA: Diagnosis not present

## 2014-09-24 DIAGNOSIS — Z8679 Personal history of other diseases of the circulatory system: Secondary | ICD-10-CM | POA: Diagnosis not present

## 2014-09-24 DIAGNOSIS — Z91040 Latex allergy status: Secondary | ICD-10-CM | POA: Diagnosis not present

## 2014-09-24 DIAGNOSIS — Z8619 Personal history of other infectious and parasitic diseases: Secondary | ICD-10-CM | POA: Diagnosis not present

## 2014-09-24 DIAGNOSIS — M542 Cervicalgia: Secondary | ICD-10-CM | POA: Insufficient documentation

## 2014-09-24 DIAGNOSIS — Z792 Long term (current) use of antibiotics: Secondary | ICD-10-CM | POA: Diagnosis not present

## 2014-09-24 DIAGNOSIS — Z880 Allergy status to penicillin: Secondary | ICD-10-CM | POA: Diagnosis not present

## 2014-09-24 DIAGNOSIS — Z79899 Other long term (current) drug therapy: Secondary | ICD-10-CM | POA: Insufficient documentation

## 2014-09-24 DIAGNOSIS — Z8661 Personal history of infections of the central nervous system: Secondary | ICD-10-CM | POA: Diagnosis not present

## 2014-09-24 DIAGNOSIS — Z8701 Personal history of pneumonia (recurrent): Secondary | ICD-10-CM | POA: Insufficient documentation

## 2014-09-24 DIAGNOSIS — R0602 Shortness of breath: Secondary | ICD-10-CM | POA: Insufficient documentation

## 2014-09-24 LAB — CSF CELL COUNT WITH DIFFERENTIAL
RBC COUNT CSF: 4 /mm3 — AB
RBC Count, CSF: 1 /mm3 — ABNORMAL HIGH
Tube #: 1
Tube #: 4
WBC, CSF: 1 /mm3 (ref 0–5)
WBC, CSF: 1 /mm3 (ref 0–5)

## 2014-09-24 LAB — CBC WITH DIFFERENTIAL/PLATELET
BASOS ABS: 0 10*3/uL (ref 0.0–0.1)
Basophils Relative: 0 % (ref 0–1)
EOS ABS: 0 10*3/uL (ref 0.0–0.7)
Eosinophils Relative: 0 % (ref 0–5)
HCT: 30.2 % — ABNORMAL LOW (ref 36.0–46.0)
Hemoglobin: 9.1 g/dL — ABNORMAL LOW (ref 12.0–15.0)
Lymphocytes Relative: 41 % (ref 12–46)
Lymphs Abs: 1.4 10*3/uL (ref 0.7–4.0)
MCH: 22.5 pg — AB (ref 26.0–34.0)
MCHC: 30.1 g/dL (ref 30.0–36.0)
MCV: 74.8 fL — ABNORMAL LOW (ref 78.0–100.0)
MONO ABS: 0.4 10*3/uL (ref 0.1–1.0)
Monocytes Relative: 13 % — ABNORMAL HIGH (ref 3–12)
NEUTROS PCT: 46 % (ref 43–77)
Neutro Abs: 1.5 10*3/uL — ABNORMAL LOW (ref 1.7–7.7)
PLATELETS: 160 10*3/uL (ref 150–400)
RBC: 4.04 MIL/uL (ref 3.87–5.11)
RDW: 18.3 % — ABNORMAL HIGH (ref 11.5–15.5)
WBC: 3.3 10*3/uL — ABNORMAL LOW (ref 4.0–10.5)

## 2014-09-24 LAB — GRAM STAIN

## 2014-09-24 LAB — I-STAT CHEM 8, ED
BUN: 7 mg/dL (ref 6–23)
CALCIUM ION: 1.15 mmol/L (ref 1.12–1.23)
Chloride: 104 mmol/L (ref 96–112)
Creatinine, Ser: 0.6 mg/dL (ref 0.50–1.10)
Glucose, Bld: 103 mg/dL — ABNORMAL HIGH (ref 70–99)
HCT: 32 % — ABNORMAL LOW (ref 36.0–46.0)
HEMOGLOBIN: 10.9 g/dL — AB (ref 12.0–15.0)
Potassium: 2.8 mmol/L — ABNORMAL LOW (ref 3.5–5.1)
Sodium: 142 mmol/L (ref 135–145)
TCO2: 22 mmol/L (ref 0–100)

## 2014-09-24 LAB — PROTEIN, CSF: Total  Protein, CSF: 15 mg/dL (ref 15–45)

## 2014-09-24 LAB — GLUCOSE, CSF: GLUCOSE CSF: 50 mg/dL (ref 43–76)

## 2014-09-24 MED ORDER — POTASSIUM CHLORIDE CRYS ER 20 MEQ PO TBCR
40.0000 meq | EXTENDED_RELEASE_TABLET | Freq: Once | ORAL | Status: AC
  Administered 2014-09-24: 40 meq via ORAL
  Filled 2014-09-24: qty 2

## 2014-09-24 MED ORDER — SODIUM CHLORIDE 0.9 % IV SOLN
Freq: Once | INTRAVENOUS | Status: AC
  Administered 2014-09-24: 04:00:00 via INTRAVENOUS

## 2014-09-24 MED ORDER — ONDANSETRON HCL 4 MG/2ML IJ SOLN
4.0000 mg | Freq: Once | INTRAMUSCULAR | Status: AC
  Administered 2014-09-24: 4 mg via INTRAVENOUS
  Filled 2014-09-24: qty 2

## 2014-09-24 MED ORDER — POTASSIUM CHLORIDE CRYS ER 20 MEQ PO TBCR: 20.0000 meq | EXTENDED_RELEASE_TABLET | Freq: Two times a day (BID) | ORAL | Status: DC

## 2014-09-24 MED ORDER — KETOROLAC TROMETHAMINE 30 MG/ML IJ SOLN
30.0000 mg | Freq: Once | INTRAMUSCULAR | Status: AC
  Administered 2014-09-24: 30 mg via INTRAVENOUS
  Filled 2014-09-24: qty 1

## 2014-09-24 MED ORDER — LIDOCAINE HCL 1 % IJ SOLN
INTRAMUSCULAR | Status: AC
  Filled 2014-09-24: qty 20

## 2014-09-24 MED ORDER — OXYCODONE-ACETAMINOPHEN 5-325 MG PO TABS
1.0000 | ORAL_TABLET | Freq: Once | ORAL | Status: AC
  Administered 2014-09-24: 1 via ORAL
  Filled 2014-09-24: qty 1

## 2014-09-24 MED ORDER — ONDANSETRON 8 MG PO TBDP: 8.0000 mg | ORAL_TABLET | Freq: Three times a day (TID) | ORAL | Status: DC | PRN

## 2014-09-24 MED ORDER — HYDROMORPHONE HCL 1 MG/ML IJ SOLN
1.0000 mg | Freq: Once | INTRAMUSCULAR | Status: AC
  Administered 2014-09-24: 1 mg via INTRAVENOUS
  Filled 2014-09-24: qty 1

## 2014-09-24 MED ORDER — POTASSIUM CHLORIDE 10 MEQ/100ML IV SOLN
10.0000 meq | INTRAVENOUS | Status: AC
  Administered 2014-09-24 (×2): 10 meq via INTRAVENOUS
  Filled 2014-09-24 (×2): qty 100

## 2014-09-24 MED ORDER — HYDROCODONE-ACETAMINOPHEN 5-325 MG PO TABS: 1.0000 | ORAL_TABLET | ORAL | Status: DC | PRN

## 2014-09-24 NOTE — ED Notes (Signed)
Pt alert and oriented x4. Respirations even and unlabored, bilateral symmetrical rise and fall of chest. Skin warm and dry. In no acute distress. Denies needs.   

## 2014-09-24 NOTE — ED Notes (Signed)
Pt back from radiology 

## 2014-09-24 NOTE — ED Provider Notes (Signed)
LUMBAR PUNCTURE Date/Time: 09/24/2014 5:48 AM Performed by: Olivia MackieTTER, Nattie Lazenby M Authorized by: Olivia MackieTTER, Elasia Furnish M Consent: Verbal consent obtained. Written consent obtained. Risks and benefits: risks, benefits and alternatives were discussed Required items: required blood products, implants, devices, and special equipment available Patient identity confirmed: verbally with patient Time out: Immediately prior to procedure a "time out" was called to verify the correct patient, procedure, equipment, support staff and site/side marked as required. Indications: evaluation for infection Anesthesia: local infiltration Local anesthetic: lidocaine 1% without epinephrine Anesthetic total: 10 ml Patient sedated: no Preparation: Patient was prepped and draped in the usual sterile fashion. Lumbar space: L3-L4 interspace (4 attempted, 2 at l3-l4, 2 at l4-l5) Patient's position: right lateral decubitus Needle gauge: 18 Number of attempts: 4 Post-procedure: site cleaned, pressure dressing applied and adhesive bandage applied Comments: Despite multiple attempts and optimal patient positioning, unable to obtain any CSF fluid.  Will discuss with radiology for fluoroscopy guided LP    Olivia Mackielga M Cozy Veale, MD 09/24/14 [phone]

## 2014-09-24 NOTE — ED Notes (Signed)
Pt to go to radiology around 0830. Pt denies she is on blood thinners.

## 2014-09-24 NOTE — ED Notes (Signed)
Per [place], RN unsuccessful LP x2 by Dr Norlene Campbelltter

## 2014-09-24 NOTE — ED Provider Notes (Signed)
7:42 AM Well appearing. Low suspicion for bacterial meningitis. If anything she likely has aseptic meningitis again. Afebrile in ER. Awaiting LP under fluoro guidance  Lyanne CoKevin M Lonell Stamos, MD 09/24/14 [phone]

## 2014-09-24 NOTE — ED Notes (Signed)
Pt arrived to the ED with a complaint of shortness of breath.  Pt was diagnosed with pneumonia three weeks ago and her symptoms are similar to that.  Pt states she also has had several bouts with meningitis and is having head , neck and back pain.  Pt also says she has had high fevers that reduce with medication but go back up when the medicine wears off.

## 2014-09-24 NOTE — ED Provider Notes (Signed)
12:25 PM Results for orders placed or performed during the hospital encounter of 09/24/14  Gram stain  Result Value Ref Range   Specimen Description CSF    Special Requests NONE    Gram Stain      CYTOSPIN WBC PRESENT,BOTH PMN AND MONONUCLEAR NO ORGANISMS SEEN Gram Stain Report Called to,Read Back By and Verified With: J.HAMILTOJN RN AT 1100 ON 2.17.16     Report Status 09/24/2014 FINAL   CBC with Differential  Result Value Ref Range   WBC 3.3 (L) 4.0 - 10.5 K/uL   RBC 4.04 3.87 - 5.11 MIL/uL   Hemoglobin 9.1 (L) 12.0 - 15.0 g/dL   HCT 16.130.2 (L) 09.636.0 - 04.546.0 %   MCV 74.8 (L) 78.0 - 100.0 fL   MCH 22.5 (L) 26.0 - 34.0 pg   MCHC 30.1 30.0 - 36.0 g/dL   RDW 40.918.3 (H) 81.111.5 - 91.415.5 %   Platelets 160 150 - 400 K/uL   Neutrophils Relative % 46 43 - 77 %   Lymphocytes Relative 41 12 - 46 %   Monocytes Relative 13 (H) 3 - 12 %   Eosinophils Relative 0 0 - 5 %   Basophils Relative 0 0 - 1 %   Neutro Abs 1.5 (L) 1.7 - 7.7 K/uL   Lymphs Abs 1.4 0.7 - 4.0 K/uL   Monocytes Absolute 0.4 0.1 - 1.0 K/uL   Eosinophils Absolute 0.0 0.0 - 0.7 K/uL   Basophils Absolute 0.0 0.0 - 0.1 K/uL   Smear Review MORPHOLOGY UNREMARKABLE   CSF cell count with differential collection tube #: 1  Result Value Ref Range   Tube # 1    Color, CSF COLORLESS COLORLESS   Appearance, CSF CLEAR CLEAR   Supernatant NOT INDICATED    RBC Count, CSF 4 (H) 0 /cu mm   WBC, CSF 1 0 - 5 /cu mm   Other Cells, CSF TOO FEW TO COUNT, SMEAR AVAILABLE FOR REVIEW   CSF cell count with differential collection tube #: 4  Result Value Ref Range   Tube # 4    Color, CSF COLORLESS COLORLESS   Appearance, CSF CLEAR CLEAR   Supernatant NOT INDICATED    RBC Count, CSF 1 (H) 0 /cu mm   WBC, CSF 1 0 - 5 /cu mm   Other Cells, CSF TOO FEW TO COUNT, SMEAR AVAILABLE FOR REVIEW   I-stat chem 8, ed  Result Value Ref Range   Sodium 142 135 - 145 mmol/L   Potassium 2.8 (L) 3.5 - 5.1 mmol/L   Chloride 104 96 - 112 mmol/L   BUN 7 6 - 23  mg/dL   Creatinine, Ser 7.820.60 0.50 - 1.10 mg/dL   Glucose, Bld 956103 (H) 70 - 99 mg/dL   Calcium, Ion 2.131.15 0.861.12 - 1.23 mmol/L   TCO2 22 0 - 100 mmol/L   Hemoglobin 10.9 (L) 12.0 - 15.0 g/dL   HCT 57.832.0 (L) 46.936.0 - 62.946.0 %   12:37 PM Pt feeling much better at this time. Home with K for her low potassium. Doubt meningitis. pcp follow up  Lyanne CoKevin M Fatime Biswell, MD 09/24/14 [phone]

## 2014-09-24 NOTE — Procedures (Signed)
Informed consent was obtained from the patient prior to the procedure, including potential complications of headache, allergy, and pain. With the patient prone, the lower back was prepped with Betadine. 1% Lidocaine was used for local anesthesia. Lumbar puncture was performed at the L2-L33] level using a 20 auge needle with return of clear CSF with an opening pressure of 27 cm water. 10 ] ml of CSF were obtained for laboratory studies. The patient tolerated the procedure well and there were no apparent complications

## 2014-09-24 NOTE — ED Notes (Signed)
Pt to radiology, will be in radiology for estimated 1 hour.

## 2014-09-24 NOTE — ED Notes (Signed)
Pt still in radiology.

## 2014-09-24 NOTE — ED Provider Notes (Signed)
CSN: [id number]     Arrival date & time 09/24/14  0038 History   First MD Initiated Contact with Patient 09/24/14 [phone]     Chief Complaint  Patient presents with  . 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  At that time.  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  She's been taking Valtrex on a regular basis since that time.  She presents today with 3 days of neck pain, myalgias, fevers to 104 at him.  Pain radiates from her neck down her spine, which is consistent with a past episodes of meningitis. She states she was recently treated for pneumonia with antibiotics, i.e. Levaquin and that her shortness of breath and cough did get better but then she developed viral URI symptoms.  Shortly thereafter.  Patient is a 34 y.o. female presenting with shortness of breath. The history is provided by the patient.  Shortness of Breath Severity:  Moderate Onset quality:  Gradual Timing:  Constant Progression:  Worsening Chronicity:  Recurrent Relieved by:  Nothing Worsened by:  Nothing tried Associated symptoms: ear pain, fever, headaches and neck pain   Associated symptoms: no cough and no rash   Fever:    Timing:  Intermittent   Max temp PTA (F):  104   Temp source:  Oral   Progression:  Unchanged   Past Medical History  Diagnosis Date  . Migraine   . Anemia   . Viral meningitis    Past Surgical History  Procedure Laterality Date  . Cesarean section N/A 06/15/2013    Procedure: CESAREAN SECTION;  Surgeon: Philip Aspen, DO;  Location: [facility];  Service: Obstetrics;  Laterality: N/A;   Family History  Problem Relation Age of Onset  . Cirrhosis Father   . Alcoholism Father    History  Substance Use Topics  . Smoking status: Never Smoker   . Smokeless  tobacco: Never Used  . Alcohol Use: No   OB History    Gravida Para Term Preterm AB TAB SAB Ectopic Multiple Living   Review of Systems  Constitutional: Positive for fever. Negative for chills.  HENT: Positive for ear pain. Negative for ear discharge, sinus pressure and trouble swallowing.   Respiratory: Positive for shortness of breath. Negative for cough.   Musculoskeletal: Positive for neck pain and neck stiffness.  Skin: Negative for rash and wound.  Neurological: Positive for headaches. Negative for dizziness.  All other systems reviewed and are negative.     Allergies  Citric acid; Latex; and Penicillins cross reactors  Home Medications   Prior to Admission medications   Medication Sig Start Date End Date Taking? Authorizing Provider  ibuprofen (ADVIL,MOTRIN) 200 MG tablet Take 800 mg by mouth every 6 (six) hours as needed for moderate pain.   Yes Historical Provider, MD  levofloxacin (LEVAQUIN) 500 MG tablet Take 500 mg by mouth daily.   Yes Historical Provider, MD  valACYclovir (VALTREX) 500 MG tablet Take 500 mg by mouth 2 (two) times daily.   Yes Historical Provider, MD  HYDROcodone-acetaminophen (NORCO/VICODIN) 5-325 MG per tablet Take 1 tablet by mouth every 4 (four) hours as needed for moderate pain. 09/24/14   Lyanne Co, MD  ketorolac (TORADOL) 10 MG tablet Take 1 tablet (10 mg  total) by mouth every 6 (six) hours as needed for moderate pain or severe pain. Patient not taking: Reported on 09/24/2014 09/30/13   Jerald KiefStephen K Chiu, MD  ondansetron (ZOFRAN ODT) 8 MG disintegrating tablet Take 1 tablet (8 mg total) by mouth every 8 (eight) hours as needed for nausea or vomiting. 09/24/14   Lyanne CoKevin M Campos, MD  ondansetron (ZOFRAN) 4 MG tablet Take 1 tablet (4 mg total) by mouth every 6 (six) hours as needed for nausea. Patient not taking: Reported on 09/24/2014 09/30/13   Jerald KiefStephen K Chiu, MD  potassium chloride SA (K-DUR,KLOR-CON) 20 MEQ tablet Take 1 tablet  (20 mEq total) by mouth 2 (two) times daily. 09/24/14   Lyanne CoKevin M Campos, MD   BP 127/90 mmHg  Pulse 65  Temp(Src) 98 F (36.7 C) (Oral)  Resp 16  Ht 5\' 7"  (1.702 m)  Wt 163 lb (73.936 kg)  BMI 25.52 kg/m2  SpO2 100%  LMP 09/07/2014 (Exact Date) Physical Exam  Constitutional: She is oriented to person, place, and time. She appears well-developed and well-nourished.  HENT:  Head: Normocephalic.  Eyes: Pupils are equal, round, and reactive to light.  Neck:  Patient moves her head side-to-side without any discomfort when she tries to lift her head up off the bed.  She has pain that radiates down her spine  Cardiovascular: Normal rate and regular rhythm.   Pulmonary/Chest: Effort normal and breath sounds normal.  Abdominal: Soft. Bowel sounds are normal.  Musculoskeletal: Normal range of motion.  Neurological: She is alert and oriented to person, place, and time.  Skin: Skin is warm and dry. No rash noted.  Nursing note and vitals reviewed.   ED Course  Procedures (including critical care time) Labs Review Labs Reviewed  CBC WITH DIFFERENTIAL/PLATELET - Abnormal; Notable for the following:    WBC 3.3 (*)    Hemoglobin 9.1 (*)    HCT 30.2 (*)    MCV 74.8 (*)    MCH 22.5 (*)    RDW 18.3 (*)    Monocytes Relative 13 (*)    Neutro Abs 1.5 (*)    All other components within normal limits  CSF CELL COUNT WITH DIFFERENTIAL - Abnormal; Notable for the following:    RBC Count, CSF 4 (*)    All other components within normal limits  CSF CELL COUNT WITH DIFFERENTIAL - Abnormal; Notable for the following:    RBC Count, CSF 1 (*)    All other components within normal limits  I-STAT CHEM 8, ED - Abnormal; Notable for the following:    Potassium 2.8 (*)    Glucose, Bld 103 (*)    Hemoglobin 10.9 (*)    HCT 32.0 (*)    All other components within normal limits  GRAM STAIN  CSF CULTURE  HERPES SIMPLEX VIRUS CULTURE  GLUCOSE, CSF  PROTEIN, CSF  HERPES SIMPLEX VIRUS(HSV) DNA BY PCR     Imaging Review Dg Chest 2 View  09/24/2014   CLINICAL DATA:  Initial evaluation for acute shortness of breath.  EXAM: CHEST  2 VIEW  COMPARISON:  Prior study from 07/05/2011  FINDINGS: The cardiac and mediastinal silhouettes are stable in size and contour, and remain within normal limits.  The lungs are normally inflated. No airspace consolidation, pleural effusion, or pulmonary edema is identified. There is no pneumothorax.  No acute osseous abnormality identified.  IMPRESSION: No active cardiopulmonary disease.   Electronically Signed   By: Rise MuBenjamin  McClintock M.D.   On: 09/24/2014 02:38  Dg Lumbar Puncture Fluoro Guide  09/24/2014   CLINICAL DATA:  Headache.  History of meningitis.  Neck pain.  EXAM: DIAGNOSTIC LUMBAR PUNCTURE UNDER FLUOROSCOPIC GUIDANCE  FLUOROSCOPY TIME:  Fluoroscopy Time (in minutes and seconds): 1 minutes 8 seconds  Number of Acquired Images:  1  PROCEDURE: Informed consent was obtained from the patient prior to the procedure, including potential complications of headache, allergy, and pain. With the patient prone, the lower back was prepped with Betadine. 1% Lidocaine was used for local anesthesia. Lumbar puncture was performed at the L2-L3 level using a 20 gauge needle with return of clear CSF with an opening pressure of 27 cm water. Ten ml of CSF were obtained for laboratory studies. The patient tolerated the procedure well and there were no apparent complications  IMPRESSION: Successful lumbar puncture.   Electronically Signed   By: Genevive Bi M.D.   On: 09/24/2014 10:51     EKG Interpretation None      MDM   Final diagnoses:  Fever         Arman Filter, NP 09/24/14 2204  Olivia Mackie, MD 09/25/14 0630

## 2014-09-25 LAB — HERPES SIMPLEX VIRUS(HSV) DNA BY PCR
HSV 1 DNA: NEGATIVE
HSV 2 DNA: NEGATIVE

## 2014-09-27 LAB — CSF CULTURE: CULTURE: NO GROWTH

## 2014-09-27 LAB — CSF CULTURE W GRAM STAIN

## 2014-10-10 ENCOUNTER — Telehealth: Payer: Self-pay | Admitting: Hematology

## 2014-10-10 NOTE — Telephone Encounter (Signed)
Lt mess regarding new pt referral  Dx: anemia hyperproteinemia low count Referring Dr. Nicholos Johnsamachandran

## 2014-10-13 ENCOUNTER — Telehealth: Payer: Self-pay | Admitting: Hematology & Oncology

## 2014-10-13 NOTE — Telephone Encounter (Signed)
[facility]

## 2014-10-14 ENCOUNTER — Ambulatory Visit (INDEPENDENT_AMBULATORY_CARE_PROVIDER_SITE_OTHER): Payer: BLUE CROSS/BLUE SHIELD | Admitting: Neurology

## 2014-10-14 ENCOUNTER — Encounter: Payer: Self-pay | Admitting: Neurology

## 2014-10-14 VITALS — BP 140/96 | HR 70 | Ht 67.0 in | Wt 159.5 lb

## 2014-10-14 DIAGNOSIS — G032 Benign recurrent meningitis [Mollaret]: Secondary | ICD-10-CM | POA: Diagnosis not present

## 2014-10-14 DIAGNOSIS — G43101 Migraine with aura, not intractable, with status migrainosus: Secondary | ICD-10-CM

## 2014-10-14 DIAGNOSIS — G43901 Migraine, unspecified, not intractable, with status migrainosus: Secondary | ICD-10-CM | POA: Insufficient documentation

## 2014-10-14 DIAGNOSIS — H539 Unspecified visual disturbance: Secondary | ICD-10-CM | POA: Diagnosis not present

## 2014-10-14 MED ORDER — AMITRIPTYLINE HCL 25 MG PO TABS: 25.0000 mg | ORAL_TABLET | Freq: Every day | ORAL | Status: DC

## 2014-10-14 NOTE — Progress Notes (Addendum)
[facility]    Provider:  Dr Lucia Gaskins Referring Provider: Georgianne Fick, MD Primary Care Physician:  Georgianne Fick, MD  CC:  Recurrent meningitis and migraines  HPI:  [name] is a 34 y.o. female here as a referral from Dr. Nicholos Johns for recurent meningitis and migraines  She has had viral meningitis 5x since 2003, headache, stiff neck, feeling sick, goes away in 5 days approximately. Now taking anti-viral daily. She is on the Valtrex every day twice daily  twice daily. urring. 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 She has seen multiple infectious disease doctors. She has resultant migraines. Her last episode of meningitis was February of last year. She has resultant headaches. She has headaches that last for weeks at a time. She lives with the pain but sometimes she goes and gets the migraine cocktail. At home she takes excedrin migraine if really needed, she tries not to take it every day. The headache is on one side behind the ear, pulsating throbbing pain, +nausea, +light sensitivity, has blurry vision changes, worsening in severity and frequency, can be 10/10 and misses work. She is getting auras. She uses maxalt for acute management. She gets them 5 days a week. She has tried topamax. Never tried depakote. She is not taking her metoprolol daily, the nurse at the doctor's office prescribed it and so did her doctor. She tried it for a week. Migraines ha been occurring for years. She is not sleeping well at night. She has two children, she tries to go to bed by 10 but she gets up at 5:30 to 6am in the morning. She doesn't sleep like she used to. She drinks a lot of caffeine. Coffee, tea, pepsi, all day. Not breast feeding. No plans for more children currently, using BC.   Reviewed notes, labs and imaging from outside physicians, which showed:  CSF 09/24/14 HSV neg WBC 1 (190 one year ago) Protein 15 (51 a yar ago) Glucose nml  Ct 09/2013 showed No acute  intracranial abnormalities including mass lesion or mass effect, hydrocephalus, extra-axial fluid collection, midline shift, hemorrhage, or acute infarction, large ischemic events (personally reviewed images)  Notes from hospitalization in 09/2013 states patient admitted for headache, back pain, neck stiffness and severe headache with csf suggestive of aseptic meningitis, febrile to 102 and LP with 190 WBCs lymphs no organismas. In February this yea she was seen in the ED with head, neck and back pain, fevers. LP did not show WBCs so appears patient was sent home with pcp follow up.  She was seen in the ED in 2013 with migraines, 10/10 pain right side of the head with photo/phonophobia and nausea. Was given migraine cocktail with IV fluids and felt better.  Review of Systems: Patient complains of symptoms per HPI as well as the following symptoms: blurred vision, anemia, dizziness, RLS, anxiety, allergies. Pertinent negatives per HPI. All others negative.   History   Social History  . Marital Status: Married    Spouse Name: Darren  . Number of Children: 2  . Years of Education: 12   Occupational History  . Claims Professional    Social History Main Topics  . Smoking status: Never Smoker   . Smokeless tobacco: Never Used  . Alcohol Use: 0.0 oz/week    0 Standard drinks or equivalent per week     Comment: Very rare (1/month)  . Drug Use: No  . Sexual Activity: Yes    Birth Control/ Protection: None   Other  Topics Concern  . Not on file   Social History Narrative   Lives at home husband and 2 kids.   Right handed.   Caffeine use: 1/2 liter/day and coffee, tea every day    Family History  Problem Relation Age of Onset  . Cirrhosis Father   . Alcoholism Father   . Breast cancer Maternal Grandmother     great-grandmother  . Breast cancer Cousin   . Diabetes Paternal Grandfather     Past Medical History  Diagnosis Date  . Migraine   . Anemia   . Viral meningitis      Past Surgical History  Procedure Laterality Date  . Cesarean section N/A 06/15/2013    Procedure: CESAREAN SECTION;  Surgeon: Philip AspenSidney Callahan, DO;  Location: [facility];  Service: Obstetrics;  Laterality: N/A;  . Fetal blood transfusion  2014    With C-section    Current Outpatient Prescriptions  Medication Sig Dispense Refill  . HYDROcodone-acetaminophen (NORCO/VICODIN) 5-325 MG per tablet Take 1 tablet by mouth every 4 (four) hours as needed for moderate pain. 15 tablet 0  . ibuprofen (ADVIL,MOTRIN) 200 MG tablet Take 800 mg by mouth every 6 (six) hours as needed for moderate pain.    . metoprolol succinate (TOPROL-XL) 50 MG 24 hr tablet Take 50 mg by mouth daily. Pt states "I do not take like I should"  0  . ondansetron (ZOFRAN ODT) 8 MG disintegrating tablet Take 1 tablet (8 mg total) by mouth every 8 (eight) hours as needed for nausea or vomiting. 10 tablet 0  . potassium chloride SA (K-DUR,KLOR-CON) 20 MEQ tablet Take 1 tablet (20 mEq total) by mouth 2 (two) times daily. 7 tablet 0  . valACYclovir (VALTREX) 500 MG tablet Take 500 mg by mouth 2 (two) times daily.    Marland Kitchen. ketorolac (TORADOL) 10 MG tablet Take 1 tablet (10 mg total) by mouth every 6 (six) hours as needed for moderate pain or severe pain. (Patient not taking: Reported on 09/24/2014) 20 tablet 0  . levofloxacin (LEVAQUIN) 500 MG tablet Take 500 mg by mouth daily.    . ondansetron (ZOFRAN) 4 MG tablet Take 1 tablet (4 mg total) by mouth every 6 (six) hours as needed for nausea. (Patient not taking: Reported on 09/24/2014) 20 tablet 0   No current facility-administered medications for this visit.    Allergies as of 10/14/2014 - Review Complete 10/14/2014  Allergen Reaction Noted  . Citric acid  05/23/2011  . Latex Rash 06/14/2013  . Penicillins cross reactors Hives and Rash 05/23/2011    Vitals: BP 140/96 mmHg  Pulse 70  Ht 5\' 7"  (1.702 m)  Wt 159 lb 8 oz (72.349 kg)  BMI 24.98 kg/m2  LMP 09/07/2014 (Exact Date) Last  Weight:  Wt Readings from Last 1 Encounters:  10/14/14 159 lb 8 oz (72.349 kg)   Last Height:   Ht Readings from Last 1 Encounters:  10/14/14 5\' 7"  (1.702 m)   Physical exam: Exam: Gen: NAD, conversant, well nourised, well groomed                     CV: RRR, no MRG. No Carotid Bruits. No peripheral edema, warm, nontender Eyes: Conjunctivae clear without exudates or hemorrhage  Neuro: Detailed Neurologic Exam  Speech:    Speech is normal; fluent and spontaneous with normal comprehension.  Cognition:    The patient is oriented to person, place, and time;     recent and remote memory intact;  language fluent;     normal attention, concentration,     fund of knowledge Cranial Nerves:    The pupils are equal, round, and reactive to light. The fundi are normal and spontaneous venous pulsations are present. Visual fields are full to finger confrontation. Extraocular movements are intact. Trigeminal sensation is intact and the muscles of mastication are normal. The face is symmetric. The palate elevates in the midline. Hearing intact. Voice is normal. Shoulder shrug is normal. The tongue has normal motion without fasciculations.   Coordination:    Normal finger to nose and heel to shin. Normal rapid alternating movements.   Gait:    Heel-toe and tandem gait are normal.   Motor Observation:    No asymmetry, no atrophy, and no involuntary movements noted. Tone:    Normal muscle tone.    Posture:    Posture is normal. normal erect    Strength:    Strength is V/V in the upper and lower limbs.      Sensation: intact to LT     Reflex Exam:  DTR's:    Deep tendon reflexes in the upper and lower extremities are normal bilaterally.   Toes:    The toes are downgoing bilaterally.   Clonus:    Clonus is absent.       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 Will try Amitriptyline at night , discussed  lactation and teratogenic effects. Continue daily valtrex. Continue daily metoprolol. Discussed botox. When migraines are better controlled can discuss acute management of breakthrough migraines. MRI of the brain and CMP.   Patient was evaluated by the eye care group, Katha Cabal AL OD. No manifestation objectively or subjectively of optic nerve inflammation or ischemia. No neurologic deficits at this time.  Naomie Dean, MD  [facility] [address] [place], [place] [address]  Phone [phone] Fax [phone]

## 2014-10-14 NOTE — Patient Instructions (Addendum)
Overall you are doing fairly well but I do want to suggest a few things today:   Remember to drink plenty of fluid, eat healthy meals and do not skip any meals. Try to eat protein with a every meal and eat a healthy snack such as fruit or nuts in between meals. Try to keep a regular sleep-wake schedule and try to exercise daily, particularly in the form of walking, 20-30 minutes a day, if you can.   Mollaret's meningitis  As far as your medications are concerned, I would like to suggest: Amitriptyline 25mg  in the evenings Can try either Treximet or Relpax. Please take one tablet at the onset of your headache. If it does not improve the symptoms please take one additional tablet. Do not take more then 2 tablets in 24hrs. Do not take use more then 2 to 3 days in a week.  As far as diagnostic testing: MRi of the brain  I would like to see you back in 3 months, sooner if we need to. Please call us with any interim questions, concerns, problems, updates or refill requests.   Please also call us for any test results so we can go over those with you on the phone.  My clinical assistant and will answer any of your questions and relay your messages to me and also relay most of my messages to you.   Our phone number is [phone]. We also have an after hours call service for urgent matters and there is a physician on-call for urgent questions. For any emergencies you know to call 911 or go to the nearest emergency room

## 2014-10-15 ENCOUNTER — Encounter: Payer: Self-pay | Admitting: Hematology & Oncology

## 2014-10-15 ENCOUNTER — Telehealth: Payer: Self-pay | Admitting: *Deleted

## 2014-10-15 LAB — COMPREHENSIVE METABOLIC PANEL
ALBUMIN: 4.3 g/dL (ref 3.5–5.5)
ALT: 10 IU/L (ref 0–32)
AST: 18 IU/L (ref 0–40)
Albumin/Globulin Ratio: 1.2 (ref 1.1–2.5)
Alkaline Phosphatase: 76 IU/L (ref 39–117)
BUN/Creatinine Ratio: 18 (ref 8–20)
BUN: 11 mg/dL (ref 6–20)
Bilirubin Total: 0.4 mg/dL (ref 0.0–1.2)
CHLORIDE: 102 mmol/L (ref 97–108)
CO2: 24 mmol/L (ref 18–29)
Calcium: 9.4 mg/dL (ref 8.7–10.2)
Creatinine, Ser: 0.6 mg/dL (ref 0.57–1.00)
GFR, EST AFRICAN AMERICAN: 140 mL/min/{1.73_m2} (ref >59)
GFR, EST NON AFRICAN AMERICAN: 122 mL/min/{1.73_m2} (ref >59)
Globulin, Total: 3.6 g/dL (ref 1.5–4.5)
Glucose: 107 mg/dL — ABNORMAL HIGH (ref 65–99)
POTASSIUM: 3.8 mmol/L (ref 3.5–5.2)
Sodium: 140 mmol/L (ref 134–144)
TOTAL PROTEIN: 7.9 g/dL (ref 6.0–8.5)

## 2014-10-15 NOTE — Telephone Encounter (Signed)
Talked with pt about normal lab values per Dr. Lucia GaskinsAhern notes. Pt verbalized understanding.

## 2014-11-04 ENCOUNTER — Telehealth: Payer: Self-pay | Admitting: Hematology & Oncology

## 2014-11-04 NOTE — Telephone Encounter (Signed)
I spoke w NEW PATIENT today to remind them of their appointment with Dr. Ennever. Also, advised them to bring all medication bottles and insurance card information. ° °

## 2014-11-05 ENCOUNTER — Ambulatory Visit (HOSPITAL_BASED_OUTPATIENT_CLINIC_OR_DEPARTMENT_OTHER): Payer: BLUE CROSS/BLUE SHIELD | Admitting: Hematology & Oncology

## 2014-11-05 ENCOUNTER — Ambulatory Visit: Payer: BLUE CROSS/BLUE SHIELD

## 2014-11-05 ENCOUNTER — Encounter: Payer: Self-pay | Admitting: Hematology & Oncology

## 2014-11-05 ENCOUNTER — Other Ambulatory Visit (HOSPITAL_BASED_OUTPATIENT_CLINIC_OR_DEPARTMENT_OTHER): Payer: BLUE CROSS/BLUE SHIELD

## 2014-11-05 VITALS — BP 129/87 | HR 77 | Temp 98.2°F | Resp 14 | Ht 67.0 in | Wt 159.0 lb

## 2014-11-05 DIAGNOSIS — N921 Excessive and frequent menstruation with irregular cycle: Secondary | ICD-10-CM | POA: Diagnosis not present

## 2014-11-05 DIAGNOSIS — D500 Iron deficiency anemia secondary to blood loss (chronic): Secondary | ICD-10-CM

## 2014-11-05 DIAGNOSIS — D509 Iron deficiency anemia, unspecified: Secondary | ICD-10-CM

## 2014-11-05 HISTORY — DX: Iron deficiency anemia, unspecified: D50.9

## 2014-11-05 HISTORY — DX: Excessive and frequent menstruation with irregular cycle: N92.1

## 2014-11-05 LAB — CBC WITH DIFFERENTIAL (CANCER CENTER ONLY)
BASO#: 0 10*3/uL (ref 0.0–0.2)
BASO%: 0.5 % (ref 0.0–2.0)
EOS%: 2.9 % (ref 0.0–7.0)
Eosinophils Absolute: 0.1 10*3/uL (ref 0.0–0.5)
HEMATOCRIT: 28.7 % — AB (ref 34.8–46.6)
HGB: 8.7 g/dL — ABNORMAL LOW (ref 11.6–15.9)
LYMPH#: 1.8 10*3/uL (ref 0.9–3.3)
LYMPH%: 47.7 % (ref 14.0–48.0)
MCH: 22.9 pg — AB (ref 26.0–34.0)
MCHC: 30.3 g/dL — ABNORMAL LOW (ref 32.0–36.0)
MCV: 76 fL — ABNORMAL LOW (ref 81–101)
MONO#: 0.4 10*3/uL (ref 0.1–0.9)
MONO%: 9.9 % (ref 0.0–13.0)
NEUT#: 1.5 10*3/uL (ref 1.5–6.5)
NEUT%: 39 % — ABNORMAL LOW (ref 39.6–80.0)
Platelets: 290 10*3/uL (ref 145–400)
RBC: 3.8 10*6/uL (ref 3.70–5.32)
RDW: 19 % — AB (ref 11.1–15.7)
WBC: 3.8 10*3/uL — AB (ref 3.9–10.0)

## 2014-11-05 LAB — CHCC SATELLITE - SMEAR

## 2014-11-05 NOTE — Progress Notes (Signed)
Referral MD  Reason for Referral: Iron deficiency anemia   Chief Complaint  Patient presents with  . NEW PATIENT  : I blood is low and I am tired.  HPI: [name] is a very nice 34 year old African-American female. She has been in decent shape. She has 2 kids. Last one was done by C-section because of some distress. She said that she had a blood transfusion with her last regnancy.  She has been found to be anemic. Blood work that was done by her family doctor back in February showed a white cell count of 3.1. Hemoglobin 9.2. Hematocrit 40.4. Platelet count 296,000. Her MCV was 75. She had some iron studies done. Her ferritin was 54. Her iron level was 16. She had normal looks like does have a low potassium of 2.8.  She does have irritable bowel syndrome. She has alternating constipation with some diarrhea. Pressure does have some migraine issues.  She has heavy cycles. She has heavy bleeding for several days. She does see a gynecologist is trying to help out. Her affect is no bleeding otherwise. She does not have sickle cell. There is no family history of sickle cell.  She does not smoke. She has occasional class of wine.  She has not had surgery as out of the C-section. Past she's not had any weight loss or weight gain. She does chew ice. She is not a vegetarian.  There's been no rashes. She's had no leg swelling. She's had no joint issues.     Past Medical History  Diagnosis Date  . Migraine   . Anemia   . Viral meningitis   :  Past Surgical History  Procedure Laterality Date  . Cesarean section N/A 06/15/2013    Procedure: CESAREAN SECTION;  Surgeon: Allyn Kenner, DO;  Location: [facility];  Service: Obstetrics;  Laterality: N/A;  . Fetal blood transfusion  2014    With C-section  :   Current outpatient prescriptions:  .  amitriptyline (ELAVIL) 25 MG tablet, Take 1 tablet (25 mg total) by mouth at bedtime., Disp: 30 tablet, Rfl: 3 .  diphenhydrAMINE (BENADRYL) 25 MG  tablet, Take 25 mg by mouth every 6 (six) hours as needed., Disp: , Rfl:  .  eletriptan (RELPAX) 40 MG tablet, Take 40 mg by mouth as needed for migraine or headache. May repeat in 2 hours if headache persists or recurs., Disp: , Rfl:  .  ibuprofen (ADVIL,MOTRIN) 200 MG tablet, Take 800 mg by mouth every 6 (six) hours as needed for moderate pain., Disp: , Rfl:  .  metoprolol succinate (TOPROL-XL) 50 MG 24 hr tablet, Take 50 mg by mouth daily. Pt states "I do not take like I should", Disp: , Rfl: 0 .  ondansetron (ZOFRAN ODT) 8 MG disintegrating tablet, Take 1 tablet (8 mg total) by mouth every 8 (eight) hours as needed for nausea or vomiting., Disp: 10 tablet, Rfl: 0 .  potassium chloride SA (K-DUR,KLOR-CON) 20 MEQ tablet, Take 1 tablet (20 mEq total) by mouth 2 (two) times daily., Disp: 7 tablet, Rfl: 0 .  SUMAtriptan-naproxen (TREXIMET) 85-500 MG per tablet, Take 1 tablet by mouth every 2 (two) hours as needed for migraine. PRN, Disp: , Rfl:  .  valACYclovir (VALTREX) 500 MG tablet, Take 500 mg by mouth 2 (two) times daily., Disp: , Rfl: :  :  Allergies  Allergen Reactions  . Citric Acid     Burns inside of mouth  . Latex Rash  . Penicillins [employer]  Hives and Rash  :  Family History  Problem Relation Age of Onset  . Cirrhosis Father   . Alcoholism Father   . Breast cancer Maternal Grandmother     great-grandmother  . Breast cancer Cousin   . Diabetes Paternal Grandfather   :  History   Social History  . Marital Status: Married    Spouse Name: Darren  . Number of Children: 2  . Years of Education: 12   Occupational History  . Claims Professional    Social History Main Topics  . Smoking status: Never Smoker   . Smokeless tobacco: Never Used     Comment: NEVER USED TOBACCO  . Alcohol Use: 0.0 oz/week    0 Standard drinks or equivalent per week     Comment: Very rare (1/month)  . Drug Use: No  . Sexual Activity: Yes    Birth Control/ Protection: None   Other  Topics Concern  . Not on file   Social History Narrative   Lives at home husband and 2 kids.   Right handed.   Caffeine use: 1/2 liter/day and coffee, tea every day  :  Pertinent items are noted in HPI.  Exam: _0 @  well-developed and well-nourished African American female in no obvious distress. Vital signs show temperature of 98.3. Pulse 77. Blood pressure 129/87. Weight is 159 pounds. Head and neck exam shows no ocular or oral lesions. There are no palpable cervical or supraclavicular lymph nodes. Lungs are clear. Cardiac exam regular rate and rhythm with no murmurs, rubs or bruits. Abdomen is soft. She has good bowel signs. There is no fluid wave. There is no palpable liver or spleen tip. Back exam shows no tenderness over the spine, ribs or hips. Extremities shows no clubbing, cyanosis or edema. She has good range of motion of her joints. There is no joint swelling, warmth or erythema. Skin exam shows no rashes, ecchymoses or petechia. Neurological exam is nonfocal.    Recent Labs  11/05/14 1537  WBC 3.8*  HGB 8.7*  HCT 28.7*  PLT 290   No results for input(s): NA, K, CL, CO2, GLUCOSE, BUN, CREATININE, CALCIUM in the last 72 hours.  Blood smear review: Mild anisocytosis and poikilocytosis. She has microcytic red cells. There is no nucleated red cells. I see no teardrop cells. There is no schistocytes or spherocytes. I see no targets cells. White cells been slowly decreased in number. She does have a slight increase in mature lymphocytes. I see no hypersegmented polys. Platelets are adequate in number and size.  Pathology: None     Assessment and Plan: [name] is a very charming 34 year old Afro-American female. She has microcytic anemia. By her blood smear, one would have to think that she has iron deficiency. There is no sickle cell in the family. I don't see anything on the blood smear that would show thalassemia.  She has heavy cycles. I think that she just does not  absorb iron although well and loses the iron with her monthly cycles.  We will see what her iron studies show. If they are low, that we will give her IV iron. She's tried oral iron but has a hard time tolerating this.  I do not see a need for a bone marrow biopsy.  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 The blood smear is characteristic of this. This is not going to be a problem from a hematologic point of view.  We will call her and let her know the iron studies show. Depending on her iron studies, this will dictate when we get her back to the office.  I spent a good 45 minutes talking with her and her fianc. They're both very nice. I had a really nice time talking with them.

## 2014-11-06 LAB — IRON AND TIBC CHCC
%SAT: 3 % — ABNORMAL LOW (ref 21–57)
Iron: 12 ug/dL — ABNORMAL LOW (ref 41–142)
TIBC: 373 ug/dL (ref 236–444)
UIBC: 360 ug/dL (ref 120–384)

## 2014-11-06 LAB — FERRITIN CHCC: Ferritin: 7 ng/mL — ABNORMAL LOW (ref 9–269)

## 2014-11-06 NOTE — Addendum Note (Signed)
Addended by: Josph MachoENNEVER, PETER R on: 11/06/2014 06:16 PM   Modules accepted: Orders

## 2014-11-07 ENCOUNTER — Telehealth: Payer: Self-pay | Admitting: Hematology & Oncology

## 2014-11-07 LAB — COMPREHENSIVE METABOLIC PANEL
ALK PHOS: 63 U/L (ref 39–117)
ALT: 9 U/L (ref 0–35)
AST: 14 U/L (ref 0–37)
Albumin: 3.8 g/dL (ref 3.5–5.2)
BUN: 11 mg/dL (ref 6–23)
CO2: 25 mEq/L (ref 19–32)
Calcium: 9.2 mg/dL (ref 8.4–10.5)
Chloride: 104 mEq/L (ref 96–112)
Creatinine, Ser: 0.56 mg/dL (ref 0.50–1.10)
Glucose, Bld: 82 mg/dL (ref 70–99)
POTASSIUM: 3.5 meq/L (ref 3.5–5.3)
Sodium: 138 mEq/L (ref 135–145)
Total Bilirubin: 0.2 mg/dL (ref 0.2–1.2)
Total Protein: 7.7 g/dL (ref 6.0–8.3)

## 2014-11-07 LAB — RETICULOCYTES (CHCC)
ABS Retic: 30.9 10*3/uL (ref 19.0–186.0)
RBC.: 3.86 MIL/uL — ABNORMAL LOW (ref 3.87–5.11)
Retic Ct Pct: 0.8 % (ref 0.4–2.3)

## 2014-11-07 LAB — HEMOGLOBINOPATHY EVALUATION
HEMOGLOBIN OTHER: 0 %
HGB A2 QUANT: 2.4 % (ref 2.2–3.2)
HGB S QUANTITAION: 0 %
Hgb A: 97.6 % (ref 96.8–97.8)
Hgb F Quant: 0 % (ref 0.0–2.0)

## 2014-11-07 LAB — ERYTHROPOIETIN: Erythropoietin: 60.2 m[IU]/mL — ABNORMAL HIGH (ref 2.6–18.5)

## 2014-11-07 NOTE — Telephone Encounter (Signed)
Pt aware of iron 4-4 and 4-11

## 2014-11-10 ENCOUNTER — Ambulatory Visit (HOSPITAL_BASED_OUTPATIENT_CLINIC_OR_DEPARTMENT_OTHER): Payer: BLUE CROSS/BLUE SHIELD

## 2014-11-10 DIAGNOSIS — D500 Iron deficiency anemia secondary to blood loss (chronic): Secondary | ICD-10-CM

## 2014-11-10 DIAGNOSIS — N921 Excessive and frequent menstruation with irregular cycle: Secondary | ICD-10-CM

## 2014-11-10 DIAGNOSIS — D509 Iron deficiency anemia, unspecified: Secondary | ICD-10-CM

## 2014-11-10 MED ORDER — SODIUM CHLORIDE 0.9 % IV SOLN
510.0000 mg | Freq: Once | INTRAVENOUS | Status: AC
  Administered 2014-11-10: 510 mg via INTRAVENOUS
  Filled 2014-11-10: qty 17

## 2014-11-10 MED ORDER — SODIUM CHLORIDE 0.9 % IV SOLN
Freq: Once | INTRAVENOUS | Status: AC
  Administered 2014-11-10: 14:00:00 via INTRAVENOUS

## 2014-11-10 NOTE — Patient Instructions (Signed)

## 2014-11-12 ENCOUNTER — Ambulatory Visit (INDEPENDENT_AMBULATORY_CARE_PROVIDER_SITE_OTHER): Payer: BLUE CROSS/BLUE SHIELD

## 2014-11-12 DIAGNOSIS — G43101 Migraine with aura, not intractable, with status migrainosus: Secondary | ICD-10-CM

## 2014-11-12 DIAGNOSIS — G032 Benign recurrent meningitis [Mollaret]: Secondary | ICD-10-CM | POA: Diagnosis not present

## 2014-11-12 DIAGNOSIS — H539 Unspecified visual disturbance: Secondary | ICD-10-CM | POA: Diagnosis not present

## 2014-11-12 MED ORDER — GADOPENTETATE DIMEGLUMINE 469.01 MG/ML IV SOLN: 15.0000 mL | Freq: Once | INTRAVENOUS | Status: AC | PRN

## 2014-11-17 ENCOUNTER — Other Ambulatory Visit: Payer: Self-pay | Admitting: Neurology

## 2014-11-17 ENCOUNTER — Telehealth: Payer: Self-pay | Admitting: Neurology

## 2014-11-17 MED ORDER — NORTRIPTYLINE HCL 10 MG PO CAPS: 20.0000 mg | ORAL_CAPSULE | Freq: Every day | ORAL | Status: DC

## 2014-11-17 NOTE — Telephone Encounter (Signed)
Spoke with patient. Discussed MRI of the brain. Suggested LP for opening pressure given that MRi showed partially empy sella and increased fluid around the optic nerves. 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 Patient declines LP. Encouraged f/u with optho and dilated exam with VF testing.

## 2014-11-18 ENCOUNTER — Ambulatory Visit: Payer: BLUE CROSS/BLUE SHIELD

## 2014-11-18 ENCOUNTER — Ambulatory Visit (HOSPITAL_BASED_OUTPATIENT_CLINIC_OR_DEPARTMENT_OTHER): Payer: BLUE CROSS/BLUE SHIELD

## 2014-11-18 DIAGNOSIS — D509 Iron deficiency anemia, unspecified: Secondary | ICD-10-CM

## 2014-11-18 DIAGNOSIS — N921 Excessive and frequent menstruation with irregular cycle: Secondary | ICD-10-CM

## 2014-11-18 DIAGNOSIS — D500 Iron deficiency anemia secondary to blood loss (chronic): Secondary | ICD-10-CM

## 2014-11-18 MED ORDER — SODIUM CHLORIDE 0.9 % IV SOLN
510.0000 mg | Freq: Once | INTRAVENOUS | Status: AC
  Administered 2014-11-18: 510 mg via INTRAVENOUS
  Filled 2014-11-18: qty 17

## 2014-11-18 MED ORDER — SODIUM CHLORIDE 0.9 % IV SOLN
Freq: Once | INTRAVENOUS | Status: AC
  Administered 2014-11-18: 09:00:00 via INTRAVENOUS

## 2014-11-18 NOTE — Patient Instructions (Signed)

## 2014-12-17 ENCOUNTER — Ambulatory Visit (HOSPITAL_BASED_OUTPATIENT_CLINIC_OR_DEPARTMENT_OTHER): Payer: BLUE CROSS/BLUE SHIELD | Admitting: Hematology & Oncology

## 2014-12-17 ENCOUNTER — Ambulatory Visit (HOSPITAL_BASED_OUTPATIENT_CLINIC_OR_DEPARTMENT_OTHER): Payer: BLUE CROSS/BLUE SHIELD

## 2014-12-17 ENCOUNTER — Encounter: Payer: Self-pay | Admitting: Hematology & Oncology

## 2014-12-17 ENCOUNTER — Other Ambulatory Visit (HOSPITAL_BASED_OUTPATIENT_CLINIC_OR_DEPARTMENT_OTHER): Payer: BLUE CROSS/BLUE SHIELD

## 2014-12-17 VITALS — BP 108/75 | HR 64 | Temp 98.2°F | Resp 14 | Ht 67.0 in | Wt 157.0 lb

## 2014-12-17 DIAGNOSIS — N921 Excessive and frequent menstruation with irregular cycle: Secondary | ICD-10-CM

## 2014-12-17 DIAGNOSIS — D509 Iron deficiency anemia, unspecified: Secondary | ICD-10-CM

## 2014-12-17 DIAGNOSIS — D500 Iron deficiency anemia secondary to blood loss (chronic): Secondary | ICD-10-CM

## 2014-12-17 LAB — RETICULOCYTES (CHCC)
ABS Retic: 26.8 10*3/uL (ref 19.0–186.0)
RBC.: 4.47 MIL/uL (ref 3.87–5.11)
RETIC CT PCT: 0.6 % (ref 0.4–2.3)

## 2014-12-17 LAB — CBC WITH DIFFERENTIAL (CANCER CENTER ONLY)
BASO#: 0 10*3/uL (ref 0.0–0.2)
BASO%: 0.8 % (ref 0.0–2.0)
EOS%: 2.1 % (ref 0.0–7.0)
Eosinophils Absolute: 0.1 10*3/uL (ref 0.0–0.5)
HCT: 36.8 % (ref 34.8–46.6)
HEMOGLOBIN: 12.2 g/dL (ref 11.6–15.9)
LYMPH#: 1.9 10*3/uL (ref 0.9–3.3)
LYMPH%: 39.7 % (ref 14.0–48.0)
MCH: 28.2 pg (ref 26.0–34.0)
MCHC: 33.2 g/dL (ref 32.0–36.0)
MCV: 85 fL (ref 81–101)
MONO#: 0.6 10*3/uL (ref 0.1–0.9)
MONO%: 12.7 % (ref 0.0–13.0)
NEUT#: 2.1 10*3/uL (ref 1.5–6.5)
NEUT%: 44.7 % (ref 39.6–80.0)
Platelets: 232 10*3/uL (ref 145–400)
RBC: 4.33 10*6/uL (ref 3.70–5.32)
WBC: 4.8 10*3/uL (ref 3.9–10.0)

## 2014-12-17 LAB — CHCC SATELLITE - SMEAR

## 2014-12-17 MED ORDER — FAMOTIDINE IN NACL 20-0.9 MG/50ML-% IV SOLN
INTRAVENOUS | Status: AC
  Filled 2014-12-17: qty 50

## 2014-12-17 MED ORDER — METHYLPREDNISOLONE SODIUM SUCC 125 MG IJ SOLR
60.0000 mg | Freq: Once | INTRAMUSCULAR | Status: AC
  Administered 2014-12-17: 60 mg via INTRAVENOUS

## 2014-12-17 MED ORDER — FERUMOXYTOL INJECTION 510 MG/17 ML
510.0000 mg | Freq: Once | INTRAVENOUS | Status: AC
Start: 2014-12-17 — End: 2014-12-17
  Administered 2014-12-17: 510 mg via INTRAVENOUS
  Filled 2014-12-17: qty 17

## 2014-12-17 MED ORDER — SODIUM CHLORIDE 0.9 % IV SOLN
INTRAVENOUS | Status: DC
  Administered 2014-12-17: 15:00:00 via INTRAVENOUS

## 2014-12-17 MED ORDER — METHYLPREDNISOLONE SODIUM SUCC 125 MG IJ SOLR
INTRAMUSCULAR | Status: AC
  Filled 2014-12-17: qty 2

## 2014-12-17 MED ORDER — FAMOTIDINE IN NACL 20-0.9 MG/50ML-% IV SOLN
40.0000 mg | Freq: Once | INTRAVENOUS | Status: AC
  Administered 2014-12-17: 40 mg via INTRAVENOUS

## 2014-12-17 NOTE — Progress Notes (Signed)
Hematology and Oncology Follow Up Visit  Hanley BenDanielle Y Trott [id number] [birth year] 34 y.o. 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 Gardiner Coinsilen is is back for follow-up. She feels better. She did have an MRI of the brain since we saw her. She's had some headache issues. She had a history of recurrent meningitis. Thankfully, the MRI I will think show anything that looked suspicious. I think she still is following up with the neurologist.  Maryclare LabradorWe'll we first saw her, her ferritin was 7 and iron saturation was 3%. We gave her 2 doses of Feraheme. With the second dose of Feraheme, she seemed to have a rash on her hands. This ultimately got better.  She still is having some heavy cycles.  She's not chewing ice.  Her appetite is doing okay.  She's had no leg swelling. She's had no rashes. She's had no change in bowel or bladder habits. She's had no nausea or vomiting.  Overall, her performance status is ECOG 1.  Medications:  Current outpatient prescriptions:  .  eletriptan (RELPAX) 40 MG tablet, Take 40 mg by mouth as needed for migraine or headache. May repeat in 2 hours if headache persists or recurs., Disp: , Rfl:  .  ibuprofen (ADVIL,MOTRIN) 200 MG tablet, Take 800 mg by mouth every 6 (six) hours as needed for moderate pain., Disp: , Rfl:  .  metoprolol succinate (TOPROL-XL) 50 MG 24 hr tablet, Take 50 mg by mouth daily. Pt states "I do not take like I should", Disp: , Rfl: 0 .  nortriptyline (PAMELOR) 10 MG capsule, Take 2 capsules (20 mg total) by mouth at bedtime., Disp: 60 capsule, Rfl: 6 .  SUMAtriptan-naproxen (TREXIMET) 85-500 MG per tablet, Take 1 tablet by mouth every 2 (two) hours as needed for migraine. PRN, Disp: , Rfl:  .  valACYclovir (VALTREX) 500 MG tablet, Take 500 mg by mouth 2 (two) times daily., Disp: , Rfl:  .  diphenhydrAMINE (BENADRYL) 25 MG tablet, Take 25  mg by mouth every 6 (six) hours as needed., Disp: , Rfl:  .  ondansetron (ZOFRAN ODT) 8 MG disintegrating tablet, Take 1 tablet (8 mg total) by mouth every 8 (eight) hours as needed for nausea or vomiting. (Patient not taking: Reported on 12/17/2014), Disp: 10 tablet, Rfl: 0 No current facility-administered medications for this visit.  Facility-Administered Medications Ordered in Other Visits:  .  0.9 %  sodium chloride infusion, , Intravenous, Continuous, Josph MachoPeter R Ennever, MD .  famotidine (PEPCID) IVPB 20 mg premix, 40 mg, Intravenous, Once, Josph MachoPeter R Ennever, MD .  ferumoxytol [facility](FERAHEME) 510 mg in sodium chloride 0.9 % 100 mL IVPB, 510 mg, Intravenous, Once, Josph MachoPeter R Ennever, MD .  methylPREDNISolone sodium succinate (SOLU-MEDROL) 125 mg/2 mL injection 60 mg, 60 mg, Intravenous, Once, Josph MachoPeter R Ennever, MD  Allergies:  Allergies  Allergen Reactions  . Citric Acid     Burns inside of mouth  . Latex Rash  . Penicillins Cross Reactors Hives and Rash    Past Medical History, Surgical history, Social history, and Family History were reviewed and updated.  Review of Systems: As above  Physical Exam:  height is 5\' 7"  (1.702 m) and weight is 157 lb (71.215 kg). Her oral temperature is 98.2 F (36.8 C). Her blood pressure is 108/75 and her pulse is 64. Her respiration is 14.   Wt Readings from  Last 3 Encounters:  12/17/14 157 lb (71.215 kg)  11/11/14 159 lb (72.122 kg)  11/05/14 159 lb (72.122 kg)     Well-developed and well-nourished African-American female. Head and neck exam shows no ocular or oral lesions. There are no palpable cervical or supraclavicular lymph nodes. Lungs are clear. Cardiac exam regular rate and rhythm with no murmurs, rubs or bruits. Abdomen is soft. She has good bowel sounds. There is no fluid wave. There is no palpable liver or spleen tip. Back exam shows no tenderness over the spine, ribs or hips. Extremity shows no clubbing, cyanosis or edema. Skin exam shows no  rashes, ecchymoses or petechia.  Lab Results  Component Value Date   WBC 4.8 12/17/2014   HGB 12.2 12/17/2014   HCT 36.8 12/17/2014   MCV 85 12/17/2014   PLT 232 12/17/2014     Chemistry      Component Value Date/Time   NA 138 11/05/2014 1537   NA 140 10/14/2014 0956   K 3.5 11/05/2014 1537   CL 104 11/05/2014 1537   CO2 25 11/05/2014 1537   BUN 11 11/05/2014 1537   BUN 11 10/14/2014 0956   CREATININE 0.56 11/05/2014 1537      Component Value Date/Time   CALCIUM 9.2 11/05/2014 1537   ALKPHOS 63 11/05/2014 1537   AST 14 11/05/2014 1537   ALT 9 11/05/2014 1537   BILITOT 0.2 11/05/2014 1537   BILITOT 0.4 10/14/2014 0956         Impression and Plan: Ms. Gardiner Coinsilen is 34 year old African-American female. She has iron deficiency anemia. Her labs look a lot better. Her MCV is higher.  I did look at her blood smear. She did have some microcytic red cells. There were some hypochromic red cells. She had no nucleated red cells. White cells and platelets looked mature morphologically.  I think that we have I can go ahead and give her one final dose of IV iron. This way, I think we can then get her through the summer and have her come back after Labor Day.  I will premedicate her with Solu-Medrol 60 mg and Pepcid 40 mg. Hopefully this will help prevent any reaction.   Josph MachoENNEVER,PETER R, MD 5/11/20162:48 PM

## 2014-12-17 NOTE — Patient Instructions (Signed)

## 2014-12-18 ENCOUNTER — Telehealth: Payer: Self-pay | Admitting: *Deleted

## 2014-12-18 ENCOUNTER — Telehealth: Payer: Self-pay | Admitting: Hematology & Oncology

## 2014-12-18 LAB — IRON AND TIBC CHCC
%SAT: 19 % — AB (ref 21–57)
IRON: 52 ug/dL (ref 41–142)
TIBC: 271 ug/dL (ref 236–444)
UIBC: 220 ug/dL (ref 120–384)

## 2014-12-18 LAB — FERRITIN CHCC: Ferritin: 171 ng/ml (ref 9–269)

## 2014-12-18 NOTE — Telephone Encounter (Addendum)
Patient aware of results.  ----- Message from Josph MachoPeter R Ennever, MD sent at 12/18/2014 11:35 AM EDT ----- Call and tell her that the iron level is much better. Thanks

## 2014-12-18 NOTE — Telephone Encounter (Signed)
Mailed aug schedule

## 2015-02-05 ENCOUNTER — Telehealth: Payer: Self-pay | Admitting: *Deleted

## 2015-02-05 DIAGNOSIS — D509 Iron deficiency anemia, unspecified: Secondary | ICD-10-CM

## 2015-02-05 NOTE — Telephone Encounter (Signed)
Patient c/o being tired and drained. She feels cold. She would like to schedule an iron infusion. Spoke to Dr Myna HidalgoEnnever who wants patient to come in for labs prior to orders for feraheme. Appointment made. Patient aware and agreeable to plan.

## 2015-02-11 ENCOUNTER — Other Ambulatory Visit (HOSPITAL_BASED_OUTPATIENT_CLINIC_OR_DEPARTMENT_OTHER): Payer: BLUE CROSS/BLUE SHIELD

## 2015-02-11 DIAGNOSIS — D509 Iron deficiency anemia, unspecified: Secondary | ICD-10-CM

## 2015-02-11 DIAGNOSIS — N921 Excessive and frequent menstruation with irregular cycle: Secondary | ICD-10-CM

## 2015-02-11 LAB — RETICULOCYTES (CHCC)
ABS Retic: 44.5 10*3/uL (ref 19.0–186.0)
RBC.: 4.45 MIL/uL (ref 3.87–5.11)
Retic Ct Pct: 1 % (ref 0.4–2.3)

## 2015-02-11 LAB — CBC WITH DIFFERENTIAL (CANCER CENTER ONLY)
BASO#: 0 10*3/uL (ref 0.0–0.2)
BASO%: 0.2 % (ref 0.0–2.0)
EOS%: 2.4 % (ref 0.0–7.0)
Eosinophils Absolute: 0.1 10*3/uL (ref 0.0–0.5)
HEMATOCRIT: 40.2 % (ref 34.8–46.6)
HGB: 13.7 g/dL (ref 11.6–15.9)
LYMPH#: 1.7 10*3/uL (ref 0.9–3.3)
LYMPH%: 38.8 % (ref 14.0–48.0)
MCH: 31.9 pg (ref 26.0–34.0)
MCHC: 34.1 g/dL (ref 32.0–36.0)
MCV: 94 fL (ref 81–101)
MONO#: 0.6 10*3/uL (ref 0.1–0.9)
MONO%: 12.9 % (ref 0.0–13.0)
NEUT#: 2.1 10*3/uL (ref 1.5–6.5)
NEUT%: 45.7 % (ref 39.6–80.0)
Platelets: 228 10*3/uL (ref 145–400)
RBC: 4.29 10*6/uL (ref 3.70–5.32)
RDW: 18.5 % — AB (ref 11.1–15.7)
WBC: 4.5 10*3/uL (ref 3.9–10.0)

## 2015-02-11 LAB — CHCC SATELLITE - SMEAR

## 2015-02-12 LAB — IRON AND TIBC CHCC
%SAT: 24 % (ref 21–57)
Iron: 63 ug/dL (ref 41–142)
TIBC: 264 ug/dL (ref 236–444)
UIBC: 200 ug/dL (ref 120–384)

## 2015-02-12 LAB — FERRITIN CHCC: Ferritin: 194 ng/ml (ref 9–269)

## 2015-03-18 ENCOUNTER — Ambulatory Visit (HOSPITAL_BASED_OUTPATIENT_CLINIC_OR_DEPARTMENT_OTHER): Payer: BLUE CROSS/BLUE SHIELD | Admitting: Family

## 2015-03-18 ENCOUNTER — Other Ambulatory Visit (HOSPITAL_BASED_OUTPATIENT_CLINIC_OR_DEPARTMENT_OTHER): Payer: BLUE CROSS/BLUE SHIELD

## 2015-03-18 ENCOUNTER — Other Ambulatory Visit: Payer: Self-pay | Admitting: Nurse Practitioner

## 2015-03-18 ENCOUNTER — Encounter: Payer: Self-pay | Admitting: Family

## 2015-03-18 VITALS — BP 119/72 | HR 66 | Temp 97.2°F | Resp 16 | Wt 166.0 lb

## 2015-03-18 DIAGNOSIS — D500 Iron deficiency anemia secondary to blood loss (chronic): Secondary | ICD-10-CM

## 2015-03-18 DIAGNOSIS — N920 Excessive and frequent menstruation with regular cycle: Secondary | ICD-10-CM

## 2015-03-18 DIAGNOSIS — N921 Excessive and frequent menstruation with irregular cycle: Secondary | ICD-10-CM

## 2015-03-18 DIAGNOSIS — D509 Iron deficiency anemia, unspecified: Secondary | ICD-10-CM

## 2015-03-18 LAB — CBC WITH DIFFERENTIAL (CANCER CENTER ONLY)
BASO#: 0 10*3/uL (ref 0.0–0.2)
BASO%: 0.4 % (ref 0.0–2.0)
EOS%: 2.6 % (ref 0.0–7.0)
Eosinophils Absolute: 0.1 10*3/uL (ref 0.0–0.5)
HEMATOCRIT: 37.8 % (ref 34.8–46.6)
HGB: 12.7 g/dL (ref 11.6–15.9)
LYMPH#: 2 10*3/uL (ref 0.9–3.3)
LYMPH%: 42.2 % (ref 14.0–48.0)
MCH: 33.2 pg (ref 26.0–34.0)
MCHC: 33.6 g/dL (ref 32.0–36.0)
MCV: 99 fL (ref 81–101)
MONO#: 0.5 10*3/uL (ref 0.1–0.9)
MONO%: 11 % (ref 0.0–13.0)
NEUT#: 2 10*3/uL (ref 1.5–6.5)
NEUT%: 43.8 % (ref 39.6–80.0)
PLATELETS: 221 10*3/uL (ref 145–400)
RBC: 3.83 10*6/uL (ref 3.70–5.32)
RDW: 12 % (ref 11.1–15.7)
WBC: 4.6 10*3/uL (ref 3.9–10.0)

## 2015-03-18 LAB — COMPREHENSIVE METABOLIC PANEL
ALK PHOS: 48 U/L (ref 33–115)
ALT: 9 U/L (ref 6–29)
AST: 15 U/L (ref 10–30)
Albumin: 4 g/dL (ref 3.6–5.1)
BILIRUBIN TOTAL: 0.3 mg/dL (ref 0.2–1.2)
BUN: 10 mg/dL (ref 7–25)
CO2: 26 mmol/L (ref 20–31)
Calcium: 9.2 mg/dL (ref 8.6–10.2)
Chloride: 103 mmol/L (ref 98–110)
Creatinine, Ser: 0.74 mg/dL (ref 0.50–1.10)
GLUCOSE: 87 mg/dL (ref 65–99)
POTASSIUM: 4 mmol/L (ref 3.5–5.3)
SODIUM: 137 mmol/L (ref 135–146)
TOTAL PROTEIN: 7.3 g/dL (ref 6.1–8.1)

## 2015-03-18 LAB — RETICULOCYTES (CHCC)
ABS RETIC: 50.8 10*3/uL (ref 19.0–186.0)
RBC.: 3.91 MIL/uL (ref 3.87–5.11)
RETIC CT PCT: 1.3 % (ref 0.4–2.3)

## 2015-03-18 NOTE — Progress Notes (Signed)
Hematology and Oncology Follow Up Visit  [name] [id number] 1980-10-12 34 y.o. 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 [name] is here today for a follow-up. She has had a total of 3 doses of Feraheme since April. She is doing well but is having a little fatigue at times. She still chews a little ice but states that it is much less than she used to.  In July her ferritin was 194 with an iron saturation of 24%.  Her cycles have not been as heavy. They are normal lasting around 7 days.  She is still followed by neurology. She is still having migraines occassionally. She has had no fever, chills, n/v, cough, rash, dizziness, SOB, chest pain, palpitations, abdominal pain, changes in bowel or bladder habits. No blood in urine or stool.  She is eating healthy and has a good appetite. She is staying hydrated. Her weight is stable.  No swelling, tenderness, numbness or tingling in her extremities. No aches or pains at this time.   Medications:    Medication List       This list is accurate as of: 03/18/15  3:05 PM.  Always use your most recent med list.               diphenhydrAMINE 25 MG tablet  Commonly known as:  BENADRYL  Take 25 mg by mouth every 6 (six) hours as needed.     eletriptan 40 MG tablet  Commonly known as:  RELPAX  Take 40 mg by mouth as needed for migraine or headache. May repeat in 2 hours if headache persists or recurs.     ibuprofen 200 MG tablet  Commonly known as:  ADVIL,MOTRIN  Take 800 mg by mouth every 6 (six) hours as needed for moderate pain.     metoprolol succinate 50 MG 24 hr tablet  Commonly known as:  TOPROL-XL  Take 50 mg by mouth daily. Pt states "I do not take like I should"     nortriptyline 10 MG capsule  Commonly known as:  PAMELOR  Take 2 capsules (20 mg total) by mouth at bedtime.     ondansetron 8 MG  disintegrating tablet  Commonly known as:  ZOFRAN ODT  Take 1 tablet (8 mg total) by mouth every 8 (eight) hours as needed for nausea or vomiting.     SUMAtriptan-naproxen 85-500 MG per tablet  Commonly known as:  TREXIMET  Take 1 tablet by mouth every 2 (two) hours as needed for migraine. PRN     valACYclovir 500 MG tablet  Commonly known as:  VALTREX  Take 500 mg by mouth 2 (two) times daily.        Allergies:  Allergies  Allergen Reactions  . Citric Acid     Burns inside of mouth  . Latex Rash  . Penicillins Cross Reactors Hives and Rash    Past Medical History, Surgical history, Social history, and Family History were reviewed and updated.  Review of Systems: All other 10 point review of systems is negative.   Physical Exam:  weight is 166 lb (75.297 kg). Her oral temperature is 97.2 F (36.2 C). Her blood pressure is 119/72 and her pulse is 66. Her respiration is 16.   Wt Readings from Last 3 Encounters:  03/18/15 166 lb (75.297 kg)  12/17/14 157 lb (71.215 kg)  11/11/14 159 lb (  72.122 kg)    Ocular: Sclerae unicteric, pupils equal, round and reactive to light Ear-nose-throat: Oropharynx clear, dentition fair Lymphatic: No cervical or supraclavicular adenopathy Lungs no rales or rhonchi, good excursion bilaterally Heart regular rate and rhythm, no murmur appreciated Abd soft, nontender, positive bowel sounds MSK no focal spinal tenderness, no joint edema Neuro: non-focal, well-oriented, appropriate affect Breasts: Deferred  Lab Results  Component Value Date   WBC 4.6 03/18/2015   HGB 12.7 03/18/2015   HCT 37.8 03/18/2015   MCV 99 03/18/2015   PLT 221 03/18/2015   Lab Results  Component Value Date   FERRITIN 194 02/11/2015   IRON 63 02/11/2015   TIBC 264 02/11/2015   UIBC 200 02/11/2015   IRONPCTSAT 24 02/11/2015   Lab Results  Component Value Date   RETICCTPCT 1.0 02/11/2015   RBC 3.83 03/18/2015   RETICCTABS 44.5 02/11/2015   No results  found for: KPAFRELGTCHN, LAMBDASER, KAPLAMBRATIO No results found for: IGGSERUM, IGA, IGMSERUM No results found for: Marda Stalker, SPEI   Chemistry      Component Value Date/Time   NA 138 11/05/2014 1537   NA 140 10/14/2014 0956   K 3.5 11/05/2014 1537   CL 104 11/05/2014 1537   CO2 25 11/05/2014 1537   BUN 11 11/05/2014 1537   BUN 11 10/14/2014 0956   CREATININE 0.56 11/05/2014 1537      Component Value Date/Time   CALCIUM 9.2 11/05/2014 1537   ALKPHOS 63 11/05/2014 1537   AST 14 11/05/2014 1537   ALT 9 11/05/2014 1537   BILITOT 0.2 11/05/2014 1537   BILITOT 0.4 10/14/2014 0956     Impression and Plan: Ms. [name] is a very pleasant 34 yo African-American female with iron deficiency anemia secondary to menorrhagia.  She has had 3 doses of feraheme and is starting to feel much better. Her fatigue is improved and she is chewing less ice.  Her Hgb is 12.7 with an MCV of 99. We will see what her iron studies show. We can bring her in next week at her convenience for Feraheme if needed.  We will plan to see her back in 6 months for labs and follow-up.  She knows to contact us with any questions or concerns. We can certainly see her sooner if need be.   Verdie Mosher, NP 8/10/20163:05 PM

## 2015-03-19 LAB — IRON AND TIBC CHCC
%SAT: 26 % (ref 21–57)
Iron: 69 ug/dL (ref 41–142)
TIBC: 263 ug/dL (ref 236–444)
UIBC: 194 ug/dL (ref 120–384)

## 2015-03-19 LAB — FERRITIN CHCC: Ferritin: 153 ng/ml (ref 9–269)

## 2015-03-29 ENCOUNTER — Emergency Department (HOSPITAL_COMMUNITY)
Admission: EM | Admit: 2015-03-29 | Discharge: 2015-03-29 | Disposition: A | Discharge status: Home / Self Care | Payer: BLUE CROSS/BLUE SHIELD | Attending: Emergency Medicine | Admitting: Emergency Medicine

## 2015-03-29 ENCOUNTER — Encounter (HOSPITAL_COMMUNITY): Payer: Self-pay | Admitting: *Deleted

## 2015-03-29 DIAGNOSIS — Z862 Personal history of diseases of the blood and blood-forming organs and certain disorders involving the immune mechanism: Secondary | ICD-10-CM | POA: Insufficient documentation

## 2015-03-29 DIAGNOSIS — Z91040 Latex allergy status: Secondary | ICD-10-CM | POA: Diagnosis not present

## 2015-03-29 DIAGNOSIS — G43909 Migraine, unspecified, not intractable, without status migrainosus: Secondary | ICD-10-CM | POA: Insufficient documentation

## 2015-03-29 DIAGNOSIS — Z79899 Other long term (current) drug therapy: Secondary | ICD-10-CM | POA: Diagnosis not present

## 2015-03-29 DIAGNOSIS — G032 Benign recurrent meningitis [Mollaret]: Secondary | ICD-10-CM | POA: Insufficient documentation

## 2015-03-29 DIAGNOSIS — Z8742 Personal history of other diseases of the female genital tract: Secondary | ICD-10-CM | POA: Diagnosis not present

## 2015-03-29 DIAGNOSIS — R51 Headache: Secondary | ICD-10-CM | POA: Diagnosis present

## 2015-03-29 DIAGNOSIS — Z880 Allergy status to penicillin: Secondary | ICD-10-CM | POA: Insufficient documentation

## 2015-03-29 DIAGNOSIS — Z8619 Personal history of other infectious and parasitic diseases: Secondary | ICD-10-CM | POA: Diagnosis not present

## 2015-03-29 LAB — CBC WITH DIFFERENTIAL/PLATELET
Basophils Absolute: 0 10*3/uL (ref 0.0–0.1)
Basophils Relative: 0 % (ref 0–1)
EOS ABS: 0.1 10*3/uL (ref 0.0–0.7)
EOS PCT: 2 % (ref 0–5)
HCT: 39.6 % (ref 36.0–46.0)
Hemoglobin: 12.9 g/dL (ref 12.0–15.0)
LYMPHS ABS: 2 10*3/uL (ref 0.7–4.0)
LYMPHS PCT: 41 % (ref 12–46)
MCH: 32.3 pg (ref 26.0–34.0)
MCHC: 32.6 g/dL (ref 30.0–36.0)
MCV: 99.2 fL (ref 78.0–100.0)
MONO ABS: 0.6 10*3/uL (ref 0.1–1.0)
Monocytes Relative: 12 % (ref 3–12)
Neutro Abs: 2.2 10*3/uL (ref 1.7–7.7)
Neutrophils Relative %: 45 % (ref 43–77)
PLATELETS: 212 10*3/uL (ref 150–400)
RBC: 3.99 MIL/uL (ref 3.87–5.11)
RDW: 12.4 % (ref 11.5–15.5)
WBC: 4.9 10*3/uL (ref 4.0–10.5)

## 2015-03-29 LAB — CSF CELL COUNT WITH DIFFERENTIAL
LYMPHS CSF: 86 % — AB (ref 40–80)
Lymphs, CSF: 92 % — ABNORMAL HIGH (ref 40–80)
MONOCYTE-MACROPHAGE-SPINAL FLUID: 12 % — AB (ref 15–45)
MONOCYTE-MACROPHAGE-SPINAL FLUID: 6 % — AB (ref 15–45)
RBC Count, CSF: 11 /mm3 — ABNORMAL HIGH
RBC Count, CSF: 46 /mm3 — ABNORMAL HIGH
SEGMENTED NEUTROPHILS-CSF: 2 % (ref 0–6)
Segmented Neutrophils-CSF: 2 % (ref 0–6)
TUBE #: 1
TUBE #: 4
WBC CSF: 18 /mm3 — AB (ref 0–5)
WBC, CSF: 39 /mm3 (ref 0–5)

## 2015-03-29 LAB — PROTEIN, CSF: Total  Protein, CSF: 37 mg/dL (ref 15–45)

## 2015-03-29 LAB — BASIC METABOLIC PANEL
Anion gap: 8 (ref 5–15)
BUN: 9 mg/dL (ref 6–20)
CALCIUM: 9.2 mg/dL (ref 8.9–10.3)
CO2: 23 mmol/L (ref 22–32)
CREATININE: 0.75 mg/dL (ref 0.44–1.00)
Chloride: 106 mmol/L (ref 101–111)
GFR calc Af Amer: >60 mL/min (ref >60)
GLUCOSE: 89 mg/dL (ref 65–99)
Potassium: 3.8 mmol/L (ref 3.5–5.1)
SODIUM: 137 mmol/L (ref 135–145)

## 2015-03-29 LAB — GLUCOSE, CSF: Glucose, CSF: 48 mg/dL (ref 40–70)

## 2015-03-29 MED ORDER — PROCHLORPERAZINE EDISYLATE 5 MG/ML IJ SOLN
10.0000 mg | Freq: Once | INTRAMUSCULAR | Status: AC
  Administered 2015-03-29: 10 mg via INTRAVENOUS
  Filled 2015-03-29: qty 2

## 2015-03-29 MED ORDER — HYDROMORPHONE HCL 1 MG/ML IJ SOLN
1.0000 mg | Freq: Once | INTRAMUSCULAR | Status: AC
  Administered 2015-03-29: 1 mg via INTRAVENOUS
  Filled 2015-03-29: qty 1

## 2015-03-29 MED ORDER — LIDOCAINE HCL 2 % IJ SOLN
INTRAMUSCULAR | Status: DC
Start: 2015-03-29 — End: 2015-03-29
  Filled 2015-03-29: qty 20

## 2015-03-29 MED ORDER — CYCLOBENZAPRINE HCL 10 MG PO TABS: 10.0000 mg | ORAL_TABLET | Freq: Once | ORAL | Status: DC

## 2015-03-29 MED ORDER — PROCHLORPERAZINE MALEATE 10 MG PO TABS: 10.0000 mg | ORAL_TABLET | Freq: Two times a day (BID) | ORAL | Status: DC | PRN

## 2015-03-29 MED ORDER — OXYCODONE-ACETAMINOPHEN 5-325 MG PO TABS: 1.0000 | ORAL_TABLET | Freq: Four times a day (QID) | ORAL | Status: DC | PRN

## 2015-03-29 MED ORDER — SODIUM CHLORIDE 0.9 % IV BOLUS (SEPSIS)
1000.0000 mL | Freq: Once | INTRAVENOUS | Status: AC
Start: 2015-03-29 — End: 2015-03-29
  Administered 2015-03-29: 1000 mL via INTRAVENOUS

## 2015-03-29 NOTE — Discharge Instructions (Signed)
Viral Meningitis Meningitis is an inflammation of the fluid and membranes surrounding the spinal cord and brain. Viral meningitis is caused by a viral infection. It is important to know whether a virus or bacterium is causing your meningitis. Viral meningitis is generally less severe than bacterial meningitis. Aseptic meningitis is any meningitis not caused by a bacterial infection, including viral meningitis. Meningitis can also be caused by fungi, parasites, certain medicines, cancer, and autoimmune disorders. Uncomplicated meningitis usually resolves on its own in 7 to 10 days.However, there can be complicated cases that last much longer. People with lowered immune systems are at greater risk for poor outcomes from meningitis.It is important to get treatment as soon as possible to minimize the impact of a meningitis infection. Long-term complications could include seizures, hearing loss, weakness, paralysis, blindness, or cognitive impairment. CAUSES Most cases of meningitis are due to viruses. Viruses that cause meningitis include enteroviruses (such as polio and coxsackie viruses), herpes simplex virus, varicella zoster virus, mumps, and HIV. SYMPTOMS  Symptoms can develop over many hours. They may even take a few days to develop. Common symptoms of meningitis in people over the age of 2 years include:  High fever.  Headache.  Stiff neck.  Irritability.  Nausea and vomiting.  Fatigue.  Discomfort from exposure to light.  Discomfort from exposure to loud noise.  Trouble walking.  Altered mental status.  Seizures. DIAGNOSIS  Early diagnosis and treatment are very important. If you have symptoms, you should see your caregiver right away. Your evaluation may include lab tests of your blood and spinal fluid. A spinal fluid sample is taken through a procedure called a lumbar puncture or spinal tap. During the procedure, a needle is inserted into an area in the lower back. You may also  have a CT scan of your brain as part of your evaluation.If there is suspicion of an infection or inflammation of the brain (encephalitis), an MRI scan may be done. TREATMENT   Antibiotics are not effective against a virus. Antiviral medicines may be used depending on the cause of your meningitis.  Treatment for viral meningitis focuses on reducing your symptoms. This may include rest, hydration, and medicines to reduce fever and pain.  Steroids may also be used if there is significant swelling of the brain. PREVENTION  Vaccines can help prevent meningitis caused by polio, measles, and mumps.  Strict hand washing is effective in controlling the spread of many causes of meningitis.  Using barrier protection during sexual intercourse can prevent the spread of meningitis caused by viruses such as the herpes virus or HIV.  Protection from mosquitoes, especially for the very young and very old, can prevent some causes of meningitis. HOME CARE INSTRUCTIONS  Rest.  Drink enough water and fluids to keep your urine clear or pale yellow.  Take all medicine as prescribed.  Practice good hygiene to prevent others from getting sick.  Follow up with your caregiver as directed. SEEK IMMEDIATE MEDICAL CARE IF:  You develop dizziness.  You have a very fast heartbeat.  You have difficulty breathing.  You develop confusion.  You have seizures.  You have unstoppable nausea and vomiting.  You develop any worsening symptoms. MAKE SURE YOU:  Understand these instructions.  Will watch your condition.  Will get help right away if you are not doing well or get worse. Document Released: 07/15/2002 Document Revised: 10/17/2011 Document Reviewed: 11/09/2010 Exit[employer] Patient Information 2015 [employer]. This information is not intended to replace advice given to you   by your health care provider. Make sure you discuss any questions you have with your health care provider.  

## 2015-03-29 NOTE — ED Provider Notes (Signed)
  Physical Exam  BP 115/67 mmHg  Pulse 75  Temp(Src) 98.1 F (36.7 C) (Oral)  Resp 18  SpO2 100%  LMP 03/14/2015  Physical Exam  ED Course  Procedures  MDM Patient with headache. 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 19 white cells in the CSF. Condition discussed with Dr. Orvan Falconer who recommended valacyclovir, patient has artery on valacyclovir. Will DC home with some pain medicines and will return for worsening symptoms       Benjiman Core, MD 03/29/15 [phone]

## 2015-03-29 NOTE — ED Provider Notes (Signed)
CSN: [id number]     Arrival date & time 03/29/15  1125 History   First MD Initiated Contact with Patient 03/29/15 1417     Chief Complaint  Patient presents with  . Headache  . Neck Pain  . Muscle Pain   HPI Pt presents to the ED with recurrent headaches and muscle aches.    She has had trouble with meningitis in the past.  She has had several bouts, none of them bacterial.  She has been treated with antivirals in the past as well.  Previously she had one culture positive but that has not been the case in more recent bouts.  She has some neck stiffness, light sensitivity and body aches.  No tick bites.   Past Medical History  Diagnosis Date  . Migraine   . Anemia   . Viral meningitis   . Iron deficiency anemia 11/05/2014  . Menometrorrhagia 11/05/2014   Past Surgical History  Procedure Laterality Date  . Cesarean section N/A 06/15/2013    Procedure: CESAREAN SECTION;  Surgeon: Philip Aspen, DO;  Location: [facility];  Service: Obstetrics;  Laterality: N/A;  . Fetal blood transfusion  2014    With C-section   Family History  Problem Relation Age of Onset  . Cirrhosis Father   . Alcoholism Father   . Breast cancer Maternal Grandmother     great-grandmother  . Breast cancer Cousin   . Diabetes Paternal Grandfather    Social History  Substance Use Topics  . Smoking status: Never Smoker   . Smokeless tobacco: Never Used     Comment: NEVER USED TOBACCO  . Alcohol Use: 0.0 oz/week    0 Standard drinks or equivalent per week     Comment: Very rare (1/month)   OB History    Gravida Para Term Preterm AB TAB SAB Ectopic Multiple Living   Review of Systems  All other systems reviewed and are negative.     Allergies  Citric acid; Latex; and Penicillins cross reactors  Home Medications   Prior to Admission medications   Medication Sig Start Date End Date Taking? Authorizing Provider  diphenhydrAMINE (BENADRYL) 25 MG tablet Take 25 mg by mouth every 6 (six)  hours as needed for allergies.    Yes Historical Provider, MD  eletriptan (RELPAX) 40 MG tablet Take 40 mg by mouth as needed for migraine or headache. May repeat in 2 hours if headache persists or recurs.   Yes Historical Provider, MD  ibuprofen (ADVIL,MOTRIN) 200 MG tablet Take 800 mg by mouth every 6 (six) hours as needed for moderate pain.   Yes Historical Provider, MD  metoprolol succinate (TOPROL-XL) 50 MG 24 hr tablet Take 50 mg by mouth daily. Pt states "I do not take like I should" 08/18/14  Yes Historical Provider, MD  nortriptyline (PAMELOR) 10 MG capsule Take 2 capsules (20 mg total) by mouth at bedtime. 11/17/14  Yes [name], MD  ondansetron (ZOFRAN ODT) 8 MG disintegrating tablet Take 1 tablet (8 mg total) by mouth every 8 (eight) hours as needed for nausea or vomiting. 09/24/14  Yes Azalia Bilis, MD  SUMAtriptan-naproxen (TREXIMET) 85-500 MG per tablet Take 1 tablet by mouth every 2 (two) hours as needed for migraine. PRN   Yes Historical Provider, MD  valACYclovir (VALTREX) 500 MG tablet Take 500 mg by mouth 2 (two) times daily.   Yes Historical Provider, MD   BP  115/67 mmHg  Pulse 75  Temp(Src) 98.1 F (36.7 C) (Oral)  Resp 18  SpO2 100%  LMP 03/14/2015 Physical Exam  Constitutional: She appears well-developed and well-nourished. No distress.  HENT:  Head: Normocephalic and atraumatic.  Right Ear: External ear normal.  Left Ear: External ear normal.  Eyes: Conjunctivae are normal. Right eye exhibits no discharge. Left eye exhibits no discharge. No scleral icterus.  Neck: Neck supple. No tracheal deviation present.  Pain with range of motion  Cardiovascular: Normal rate, regular rhythm and intact distal pulses.   Pulmonary/Chest: Effort normal and breath sounds normal. No stridor. No respiratory distress. She has no wheezes. She has no rales.  Abdominal: Soft. Bowel sounds are normal. She exhibits no distension. There is no tenderness. There is no rebound and no  guarding.  Musculoskeletal: She exhibits no edema or tenderness.  Neurological: She is alert. She has normal strength. No cranial nerve deficit (no facial droop, extraocular movements intact, no slurred speech) or sensory deficit. She exhibits normal muscle tone. She displays no seizure activity. Coordination normal.  Skin: Skin is warm and dry. No rash noted.  Psychiatric: She has a normal mood and affect.  Nursing note and vitals reviewed.   ED Course  LUMBAR PUNCTURE Date/Time: 03/29/2015 4:13 PM Performed by: Linwood Dibbles Authorized by: Linwood Dibbles Consent: Verbal consent obtained. Risks and benefits: risks, benefits and alternatives were discussed Consent given by: patient Patient understanding: patient states understanding of the procedure being performed Site marked: the operative site was marked Patient identity confirmed: verbally with patient Time out: Immediately prior to procedure a "time out" was called to verify the correct patient, procedure, equipment, support staff and site/side marked as required. Indications: evaluation for infection Anesthesia: local infiltration Local anesthetic: lidocaine 1% without epinephrine Anesthetic total: 5 ml Patient sedated: no Preparation: Patient was prepped and draped in the usual sterile fashion. Lumbar space: L3-L4 interspace Patient's position: left lateral decubitus Needle gauge: 22 Needle length: 3.5 in Number of attempts: 1 Fluid appearance: clear Tubes of fluid: 4 Total volume: 4 ml Post-procedure: site cleaned, pressure dressing applied and adhesive bandage applied Patient tolerance: Patient tolerated the procedure well with no immediate complications   (including critical care time) Labs Review Labs Reviewed  CSF CULTURE  GRAM STAIN  CBC WITH DIFFERENTIAL/PLATELET  BASIC METABOLIC PANEL  CSF CELL COUNT WITH DIFFERENTIAL  CSF CELL COUNT WITH DIFFERENTIAL  GLUCOSE, CSF  PROTEIN, CSF    Medications  lidocaine  (XYLOCAINE) 2 % (with pres) injection (not administered)  prochlorperazine (COMPAZINE) injection 10 mg (10 mg Intravenous Given 03/29/15 1530)  HYDROmorphone (DILAUDID) injection 1 mg (1 mg Intravenous Given 03/29/15 1530)  sodium chloride 0.9 % bolus 1,000 mL (1,000 mLs Intravenous New Bag/Given 03/29/15 1530)     MDM   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 Pt has seen [facility].  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  Pt would prefer to have LP performed again to see if indeed this is recurrent meningitis.  LP performed without difficulty.  Dr Rubin Payor will follow up on the results.  Labs including csf results are pending.    Linwood Dibbles, MD 03/29/15 [phone]

## 2015-03-29 NOTE — ED Notes (Signed)
Critical results on CSF. Tube #1 WBC 39 Tube #4 WBC 18. Results called to N. Rubin Payor, MD. No orders received

## 2015-03-29 NOTE — ED Notes (Addendum)
Pt reports Hx of viral meningitis. C/o HA, stiff neck, stiff back, generalized muscle aches since Friday, worse since yesterday. Some blurred vision at times. +light sensitivity. Pt denies n/v/d.

## 2015-03-30 LAB — PATHOLOGIST SMEAR REVIEW

## 2015-04-02 LAB — CSF CULTURE W GRAM STAIN

## 2015-04-02 LAB — CSF CULTURE: CULTURE: NO GROWTH

## 2015-04-03 LAB — HSV(HERPES SMPLX VRS)ABS-I+II(IGG)-CSF

## 2015-05-06 ENCOUNTER — Other Ambulatory Visit (HOSPITAL_COMMUNITY): Payer: Self-pay | Admitting: Internal Medicine

## 2015-05-06 ENCOUNTER — Ambulatory Visit (HOSPITAL_COMMUNITY)
Admission: RE | Admit: 2015-05-06 | Discharge: 2015-05-06 | Disposition: A | Discharge status: Home / Self Care | Payer: BLUE CROSS/BLUE SHIELD | Source: Ambulatory Visit | Attending: Vascular Surgery | Admitting: Vascular Surgery

## 2015-05-06 DIAGNOSIS — M7989 Other specified soft tissue disorders: Secondary | ICD-10-CM | POA: Insufficient documentation

## 2015-09-18 ENCOUNTER — Ambulatory Visit: Payer: BLUE CROSS/BLUE SHIELD | Admitting: Hematology & Oncology

## 2015-09-18 ENCOUNTER — Other Ambulatory Visit: Payer: BLUE CROSS/BLUE SHIELD

## 2016-06-11 ENCOUNTER — Encounter (HOSPITAL_BASED_OUTPATIENT_CLINIC_OR_DEPARTMENT_OTHER): Payer: Self-pay | Admitting: Emergency Medicine

## 2016-06-11 ENCOUNTER — Emergency Department (HOSPITAL_BASED_OUTPATIENT_CLINIC_OR_DEPARTMENT_OTHER)
Admission: EM | Admit: 2016-06-11 | Discharge: 2016-06-11 | Disposition: A | Discharge status: Home / Self Care | Payer: BLUE CROSS/BLUE SHIELD | Attending: Emergency Medicine | Admitting: Emergency Medicine

## 2016-06-11 DIAGNOSIS — J029 Acute pharyngitis, unspecified: Secondary | ICD-10-CM | POA: Diagnosis present

## 2016-06-11 DIAGNOSIS — J069 Acute upper respiratory infection, unspecified: Secondary | ICD-10-CM

## 2016-06-11 LAB — RAPID STREP SCREEN (MED CTR MEBANE ONLY): Streptococcus, Group A Screen (Direct): NEGATIVE

## 2016-06-11 MED ORDER — BENZONATATE 100 MG PO CAPS: 100.0000 mg | ORAL_CAPSULE | Freq: Three times a day (TID) | ORAL | 0 refills | Status: DC

## 2016-06-11 NOTE — Discharge Instructions (Signed)
Please read and follow all provided instructions.  Your diagnoses today include:  1. Viral upper respiratory tract infection    You appear to have an upper respiratory infection (URI). An upper respiratory tract infection, or cold, is a viral infection of the air passages leading to the lungs. It should improve gradually after 5-7 days. You may have a lingering cough that lasts for 2- 4 weeks after the infection.  Tests performed today include:  Vital signs. See below for your results today.   Medications prescribed:   Tessalon Perles - cough suppressant medication  Take any prescribed medications only as directed. Treatment for your infection is aimed at treating the symptoms. There are no medications, such as antibiotics, that will cure your infection.   Home care instructions:  Follow any educational materials contained in this packet.   Your illness is contagious and can be spread to others, especially during the first 3 or 4 days. It cannot be cured by antibiotics or other medicines. Take basic precautions such as washing your hands often, covering your mouth when you cough or sneeze, and avoiding public places where you could spread your illness to others.   Please continue drinking plenty of fluids. Use over-the-counter medicines as needed as directed on packaging for symptom relief.  You may also use ibuprofen or tylenol as directed on packaging for pain or fever. Do not take multiple medicines containing Tylenol or acetaminophen to avoid taking too much of this medication.  Follow-up instructions: Please follow-up with your primary care provider in the next 3 days for further evaluation of your symptoms if you are not feeling better.   Return instructions:   Please return to the Emergency Department if you experience worsening symptoms.   RETURN IMMEDIATELY IF you develop shortness of breath, confusion or altered mental status, a new rash, become dizzy, faint, or poorly  responsive, or are unable to be cared for at home.  Please return if you have persistent vomiting and cannot keep down fluids or develop a fever that is not controlled by tylenol or motrin.    Please return if you have any other emergent concerns.  Additional Information:  Your vital signs today were: BP 127/86 (BP Location: Right Arm)    Pulse 71    Temp 98 F (36.7 C) (Oral)    Resp 18    Ht 5\' 7"  (1.702 m)    Wt 76.2 kg    LMP 05/27/2016    SpO2 100%    BMI 26.31 kg/m  If your blood pressure (BP) was elevated above 135/85 this visit, please have this repeated by your doctor within one month. --------------

## 2016-06-11 NOTE — ED Provider Notes (Signed)
[facility]   CSN: [id number] Arrival date & time: 06/11/16  1146     History   Chief Complaint Chief Complaint  Patient presents with  . Sore Throat    HPI [name] is a 35 y.o. female.  Patient presents with complaint of sore throat, cough, nasal congestion, fevers for the past 1 week. Patient states that her children are sick at home with similar symptoms. Her son had a positive flu test. No vomiting or diarrhea. Worst symptoms are the sore throat and cough which is keeping her up at night. Patient has been taking over-the-counter medications without much relief. No chest pain or shortness of breath. No lower extremity swelling. Onset of symptoms acute. Course is constant. Nothing makes symptoms better or worse.      Past Medical History:  Diagnosis Date  . Anemia   . Iron deficiency anemia 11/05/2014  . Menometrorrhagia 11/05/2014  . Migraine   . Viral meningitis     Patient Active Problem List   Diagnosis Date Noted  . Iron deficiency anemia 11/05/2014  . Menometrorrhagia 11/05/2014  . Migraine with status migrainosus 10/14/2014  . 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 Meningitis 09/28/2013    Past Surgical History:  Procedure Laterality Date  . CESAREAN SECTION N/A 06/15/2013   Procedure: CESAREAN SECTION;  Surgeon: Philip AspenSidney Callahan, DO;  Location: [facility];  Service: Obstetrics;  Laterality: N/A;  . FETAL BLOOD TRANSFUSION  2014   With C-section    OB History    Gravida Para Term Preterm AB Living   2 2 2     2    SAB TAB Ectopic Multiple Live Births           2       Home Medications    Prior to Admission medications   Medication Sig Start Date End Date Taking? Authorizing Provider  benzonatate (TESSALON) 100 MG capsule Take 1 capsule (100 mg total) by mouth every 8 (eight) hours. 06/11/16   Renne CriglerJoshua Kilynn Fitzsimmons, PA-C  diphenhydrAMINE (BENADRYL) 25 MG tablet Take 25 mg by mouth every 6 (six) hours as needed for allergies.      Historical Provider, MD  eletriptan (RELPAX) 40 MG tablet Take 40 mg by mouth as needed for migraine or headache. May repeat in 2 hours if headache persists or recurs.    Historical Provider, MD  ibuprofen (ADVIL,MOTRIN) 200 MG tablet Take 800 mg by mouth every 6 (six) hours as needed for moderate pain.    Historical Provider, MD  metoprolol succinate (TOPROL-XL) 50 MG 24 hr tablet Take 50 mg by mouth daily. Pt states "I do not take like I should" 08/18/14   Historical Provider, MD  nortriptyline (PAMELOR) 10 MG capsule Take 2 capsules (20 mg total) by mouth at bedtime. 11/17/14   Anson FretAntonia B Ahern, MD  ondansetron (ZOFRAN ODT) 8 MG disintegrating tablet Take 1 tablet (8 mg total) by mouth every 8 (eight) hours as needed for nausea or vomiting. 09/24/14   Azalia BilisKevin Campos, MD  oxyCODONE-acetaminophen (PERCOCET/ROXICET) 5-325 MG per tablet Take 1-2 tablets by mouth every 6 (six) hours as needed for severe pain. 03/29/15   Benjiman CoreNathan Pickering, MD  prochlorperazine (COMPAZINE) 10 MG tablet Take 1 tablet (10 mg total) by mouth 2 (two) times daily as needed for nausea or vomiting. 03/29/15   Benjiman CoreNathan Pickering, MD  SUMAtriptan-naproxen (TREXIMET) 85-500 MG per tablet Take 1 tablet by mouth every 2 (two) hours as needed for migraine. PRN    Historical  Provider, MD  valACYclovir (VALTREX) 500 MG tablet Take 500 mg by mouth 2 (two) times daily.    Historical Provider, MD    Family History Family History  Problem Relation Age of Onset  . Cirrhosis Father   . Alcoholism Father   . Breast cancer Maternal Grandmother     great-grandmother  . Breast cancer Cousin   . Diabetes Paternal Grandfather     Social History Social History  Substance Use Topics  . Smoking status: Never Smoker  . Smokeless tobacco: Never Used     Comment: NEVER USED TOBACCO  . Alcohol use 0.0 oz/week     Comment: Very rare (1/month)     Allergies   Citric acid; Latex; and Penicillins cross reactors   Review of Systems Review of  Systems  Constitutional: Positive for chills. Negative for fatigue and fever.  HENT: Positive for congestion and sore throat. Negative for ear pain, rhinorrhea and sinus pressure.   Eyes: Negative for redness.  Respiratory: Positive for cough. Negative for wheezing.   Gastrointestinal: Negative for abdominal pain, diarrhea, nausea and vomiting.  Genitourinary: Negative for dysuria.  Musculoskeletal: Negative for myalgias and neck stiffness.  Skin: Negative for rash.  Neurological: Negative for headaches.  Hematological: Negative for adenopathy.     Physical Exam Updated Vital Signs BP 127/86 (BP Location: Right Arm)   Pulse 71   Temp 98 F (36.7 C) (Oral)   Resp 18   Ht 5\' 7"  (1.702 m)   Wt 76.2 kg   LMP 05/27/2016   SpO2 100%   BMI 26.31 kg/m   Physical Exam  Constitutional: She appears well-developed and well-nourished.  HENT:  Head: Normocephalic and atraumatic.  Right Ear: Tympanic membrane, external ear and ear canal normal.  Left Ear: Tympanic membrane, external ear and ear canal normal.  Nose: Mucosal edema present. No rhinorrhea.  Mouth/Throat: Uvula is midline and mucous membranes are normal. Mucous membranes are not dry. No oral lesions. No trismus in the jaw. No uvula swelling. Posterior oropharyngeal erythema present. No oropharyngeal exudate, posterior oropharyngeal edema or tonsillar abscesses.  Eyes: Conjunctivae are normal. Right eye exhibits no discharge. Left eye exhibits no discharge.  Neck: Normal range of motion. Neck supple.  Cardiovascular: Normal rate, regular rhythm and normal heart sounds.   Pulmonary/Chest: Effort normal and breath sounds normal. No respiratory distress. She has no wheezes. She has no rales.  Abdominal: Soft. There is no tenderness.  Lymphadenopathy:    She has no cervical adenopathy.  Neurological: She is alert.  Skin: Skin is warm and dry.  Psychiatric: She has a normal mood and affect.  Nursing note and vitals  reviewed.    ED Treatments / Results  Labs (all labs ordered are listed, but only abnormal results are displayed) Labs Reviewed  RAPID STREP SCREEN (NOT AT [facility])  CULTURE, GROUP A STREP [facility])    Procedures Procedures (including critical care time)   Initial Impression / Assessment and Plan / ED Course  I have reviewed the triage vital signs and the nursing notes.  Pertinent labs & imaging results that were available during my care of the patient were reviewed by me and considered in my medical decision making (see chart for details).  Clinical Course   Patient seen and examined.   Vital signs reviewed and are as follows: BP 121/85 (BP Location: Right Arm)   Pulse 63   Temp 98 F (36.7 C) (Oral)   Resp 16   Ht 5\' 7"  (1.702 m)  Wt 76.2 kg   LMP 05/27/2016   SpO2 100%   BMI 26.31 kg/m   Patient counseled on supportive care for viral URI and s/s to return including worsening symptoms, persistent fever, persistent vomiting, or if they have any other concerns. Urged to see PCP if symptoms persist for more than 3 days. Patient verbalizes understanding and agrees with plan.    Final Clinical Impressions(s) / ED Diagnoses   Final diagnoses:  Viral upper respiratory tract infection   Patient with symptoms consistent with a viral syndrome. Maybe flu given exposure but no fever and no indication for Tamiflu in this case. Vitals are stable, no fever. No signs of dehydration. Lung exam normal, no signs of pneumonia. Supportive therapy indicated with return if symptoms worsen.      New Prescriptions New Prescriptions   BENZONATATE (TESSALON) 100 MG CAPSULE    Take 1 capsule (100 mg total) by mouth every 8 (eight) hours.     Renne CriglerJoshua Jaxzen Vanhorn, PA-C 06/11/16 1443    Linwood DibblesJon Knapp, MD 06/12/16 [phone]

## 2016-06-11 NOTE — ED Triage Notes (Signed)
Pt c/o sore throat, fever x 2d; now reports cough and congestion.

## 2016-06-13 LAB — CULTURE, GROUP A STREP (THRC)

## 2017-02-07 ENCOUNTER — Ambulatory Visit: Payer: BLUE CROSS/BLUE SHIELD | Attending: Physician Assistant | Admitting: Physical Therapy

## 2017-02-07 DIAGNOSIS — M545 Low back pain, unspecified: Secondary | ICD-10-CM

## 2017-02-07 DIAGNOSIS — M546 Pain in thoracic spine: Secondary | ICD-10-CM

## 2017-02-07 DIAGNOSIS — M542 Cervicalgia: Secondary | ICD-10-CM | POA: Insufficient documentation

## 2017-02-07 DIAGNOSIS — R29898 Other symptoms and signs involving the musculoskeletal system: Secondary | ICD-10-CM | POA: Diagnosis not present
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
## 2017-02-07 NOTE — Patient Instructions (Signed)
Neck Rotation    Begin with chin level, head centered over spine. Slowly turn head to look over shoulder, keeping head centered, shoulders and torso stationary. Hold _5-10___ seconds. Slowly return to starting position. Repeat to other side. Repeat __10-15__ times to each side.  Pelvic Tilt: Posterior - Legs Bent (Supine)    Tighten stomach and flatten back by rolling pelvis down. Hold __5-10__ seconds. Relax. Repeat __15__ times per set. Do __2__ sets per session.   NECK TENSION: Assisted Stretch   Reach right arm around head and hold slightly above ear. Gently bring right ear toward right shoulder. Hold position for __30_ breaths. Repeat with other arm. Repeat __3_ times, alternating arms. Do _3__ times per day.   Piriformis Stretch, Supine   Lie supine, one ankle crossed onto opposite knee. Holding bottom leg behind knee, gently pull legs toward chest until stretch is felt in buttock of top leg. Hold _30__ seconds. For deeper stretch gently push top knee away from body.  Repeat _3__ times per session. Do _3__ sessions per day.   Scapular Retraction (Standing)    With arms at sides, pinch shoulder blades together. Repeat __15__ times per set. Do __2__ sets per session. Do __2__ sessions per day.   Axial Extension (Chin Tuck)    Pull chin in and lengthen back of neck. Hold __5__ seconds while counting out loud. Repeat _15___ times. Do _3___ sessions per day.

## 2017-02-10 ENCOUNTER — Ambulatory Visit: Payer: BLUE CROSS/BLUE SHIELD

## 2017-02-10 DIAGNOSIS — M546 Pain in thoracic spine: Secondary | ICD-10-CM | POA: Diagnosis not present

## 2017-02-10 DIAGNOSIS — R29898 Other symptoms and signs involving the musculoskeletal system: Secondary | ICD-10-CM | POA: Diagnosis not present

## 2017-02-10 DIAGNOSIS — M542 Cervicalgia: Secondary | ICD-10-CM | POA: Diagnosis not present

## 2017-02-10 DIAGNOSIS — M545 Low back pain, unspecified: Secondary | ICD-10-CM

## 2017-02-10 NOTE — Therapy (Signed)
[facility] [address]  [address] [place], [place], [address] Phone: [phone]   Fax:  [phone]  Physical Therapy Treatment  Patient Details  Name: Anita BenDanielle Y Witham MRN: [id number] Date of Birth: [birth year] Referring Provider: Norva RiffleAshley Vanstory, PA  Encounter Date: 02/10/2017      PT End of Session - 02/10/17 0945    Visit Number 2   Number of Visits 12   Date for PT Re-Evaluation 03/21/17   PT Start Time 0939  pt. arrived late    PT Stop Time 1033   PT Time Calculation (min) 54 min   Activity Tolerance Patient tolerated treatment well   Behavior During Therapy [facility] for tasks assessed/performed      Past Medical History:  Diagnosis Date  . Anemia   . Iron deficiency anemia 11/05/2014  . Menometrorrhagia 11/05/2014  . Migraine   . Viral meningitis     Past Surgical History:  Procedure Laterality Date  . CESAREAN SECTION N/A 06/15/2013   Procedure: CESAREAN SECTION;  Surgeon: Philip AspenSidney Callahan, DO;  Location: [facility];  Service: Obstetrics;  Laterality: N/A;  . FETAL BLOOD TRANSFUSION  2014   With C-section    There were no vitals filed for this visit.      Subjective Assessment - 02/10/17 0948    Subjective Pt. noting she would like to review figure-4 HEP activity due to some back pain with this.     Currently in Pain? Yes   Pain Score 7    Pain Location Shoulder   Pain Orientation Left;Upper   Pain Descriptors / Indicators Dull;Constant  "tension"   Pain Onset More than a month ago   Aggravating Factors  lifting, bending, reaching   Pain Relieving Factors heat, ice, pain meds   Multiple Pain Sites No                         [facility] Adult PT Treatment/Exercise - 02/10/17 0951      Neck Exercises: Standing   Neck Retraction 10 reps;5 secs   Neck Retraction Limitations with scap. squeeze;     Neck Exercises: Seated   Other Seated Exercise scap retraction: 10 x 5 sec     Lumbar  Exercises: Aerobic   UBE (Upper Arm Bike) UBE: lvl 1.0, 2 min each way     Modalities   Modalities Electrical Stimulation;Moist Heat     Moist Heat Therapy   Number Minutes Moist Heat 15 Minutes   Moist Heat Location Lumbar Spine     Cryotherapy   Number Minutes Cryotherapy 15 Minutes   Cryotherapy Location Cervical   Type of Cryotherapy Ice pack     Electrical Stimulation   Electrical Stimulation Location cervical    Electrical Stimulation Action IFC   Electrical Stimulation Parameters intensity to pt. tolerance, 15'    Electrical Stimulation Goals Tone;Pain     Manual Therapy   Manual Therapy Soft tissue mobilization;Myofascial release;Passive ROM   Manual therapy comments Seated    Soft tissue mobilization STM to bilateral UT, B medial scap. border; pt. tender throughout    Myofascial Release TPR to L medial scapular border; pt. very ttp with limited tolerance   Passive ROM cervical PROM rotation     Neck Exercises: Stretches   Upper Trapezius Stretch 2 reps;30 seconds   Upper Trapezius Stretch Limitations bilateral - no overpressure   Levator Stretch 2 reps;30 seconds  arms behind back  Corner Stretch 30 seconds;2 reps  x 30 sec in low and mid version                  PT Short Term Goals - 02/10/17 0948      PT SHORT TERM GOAL #1   Title patient to be independent with initial HEP (02/28/17)   Status On-going     PT SHORT TERM GOAL #2   Title Patient to demonstrate AROM of both C-spine and L-spine to WNL without pain limiting (02/28/17)   Status On-going           PT Long Term Goals - 02/10/17 1032      PT LONG TERM GOAL #1   Title patient to be independent with advanced HEP (03/21/17)   Status On-going     PT LONG TERM GOAL #2   Title Patient to demonstrate good posture and body mechanics needed for all daily activities (03/21/17)   Status On-going     PT LONG TERM GOAL #3   Title Patient to report ability to return to leisure activities and  lifting children without increase in pain (03/21/17)   Status On-going     PT LONG TERM GOAL #4   Title Patient to improve tissue quality of cervical and upper back musculature as noted by increased tissue length and reduced trigger points to allow for reduced pain/headache symtpoms (03/21/17)   Status On-going               Plan - 02/10/17 1030    Clinical Impression Statement Pt. doing well today.  Noting L UT pain main concern today.  Good overall technique with HEP.  Some LBP with piriformis stretch thus used towel to lessen lumbar strain.  Tolerated STM to UT well however limited tolerance for TPR to L medial scap. border with pt. very tender.  Pt. would benefit from further manual work to this area.  Treatment ending with moist heat to lumbar spine and cold pack/E-stim to L UT and cervical spine to decrease tone and pain.     PT Treatment/Interventions ADLs/Self Care Home Management;Cryotherapy;Electrical Stimulation;Moist Heat;Traction;Ultrasound;Neuromuscular re-education;Therapeutic exercise;Therapeutic activities;Functional mobility training;Patient/family education;Manual techniques;Passive range of motion;Vasopneumatic Device;Taping;Dry needling   PT Next Visit Plan Progress postural re-ed and try supine on noodle; STM/TPR to upper back cervical musculature to decrease tone      Patient will benefit from skilled therapeutic intervention in order to improve the following deficits and impairments:  Decreased activity tolerance, Decreased range of motion, Decreased mobility, Decreased strength, Pain, Impaired UE functional use  Visit Diagnosis: Cervicalgia  Pain in thoracic spine  Acute bilateral low back pain without sciatica  Other symptoms and signs involving the musculoskeletal system     Problem List Patient Active Problem List   Diagnosis Date Noted  . Iron deficiency anemia 11/05/2014  . Menometrorrhagia 11/05/2014  . Migraine with status migrainosus 10/14/2014   . 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 Meningitis 09/28/2013    Kermit Balo, PTA 02/10/17 10:47 AM  [facility] [address]  [address] [place], [place], [address] Phone: [phone]   Fax:  [phone]  Name: DAMONICA CHOPRA MRN: [id number] Date of Birth: Oct 27, 1980

## 2017-02-13 ENCOUNTER — Ambulatory Visit: Payer: BLUE CROSS/BLUE SHIELD

## 2017-02-13 DIAGNOSIS — M546 Pain in thoracic spine: Secondary | ICD-10-CM | POA: Diagnosis not present

## 2017-02-13 DIAGNOSIS — M545 Low back pain, unspecified: Secondary | ICD-10-CM

## 2017-02-13 DIAGNOSIS — M542 Cervicalgia: Secondary | ICD-10-CM

## 2017-02-13 DIAGNOSIS — R29898 Other symptoms and signs involving the musculoskeletal system: Secondary | ICD-10-CM

## 2017-02-13 NOTE — Therapy (Signed)
[facility] [address]  [address] [place], [place], [address] Phone: [phone]   Fax:  [phone]  Physical Therapy Treatment  Patient Details  Name: SHONA PARDO MRN: [id number] Date of Birth: [birth year] Referring Provider: Norva Riffle, PA  Encounter Date: 02/13/2017      PT End of Session - 02/13/17 0942    Visit Number 3   Number of Visits 12   Date for PT Re-Evaluation 03/21/17   PT Start Time 0936  pt. arrived late    PT Stop Time 1025   PT Time Calculation (min) 49 min   Activity Tolerance Patient tolerated treatment well   Behavior During Therapy [facility] for tasks assessed/performed      Past Medical History:  Diagnosis Date  . Anemia   . Iron deficiency anemia 11/05/2014  . Menometrorrhagia 11/05/2014  . Migraine   . Viral meningitis     Past Surgical History:  Procedure Laterality Date  . CESAREAN SECTION N/A 06/15/2013   Procedure: CESAREAN SECTION;  Surgeon: Philip Aspen, DO;  Location: [facility];  Service: Obstetrics;  Laterality: N/A;  . FETAL BLOOD TRANSFUSION  2014   With C-section    There were no vitals filed for this visit.      Subjective Assessment - 02/13/17 0939    Subjective Pt. noting she feels pain location has changed to more R sided shoulder blade today.   Currently in Pain? Yes   Pain Score 3    Pain Location Scapula   Pain Orientation Left;Right;Posterior   Pain Descriptors / Indicators Tightness  "tension"   Pain Type Acute pain   Pain Onset More than a month ago   Multiple Pain Sites No                         [facility] Adult PT Treatment/Exercise - 02/13/17 0956      Self-Care   Self-Care Other Self-Care Comments   Other Self-Care Comments  instruction and demo of self-ball massage on wall for UT/rhomboids      Neck Exercises: Machines for Strengthening   UBE (Upper Arm Bike) UBE: lvl 2.0, 3 min each way     Cryotherapy   Number Minutes  Cryotherapy 10 Minutes   Cryotherapy Location Cervical   Type of Cryotherapy Ice pack     Electrical Stimulation   Electrical Stimulation Location cervical    Electrical Stimulation Action IFC   Electrical Stimulation Parameters intensity to pt. tolerance, 10'    Electrical Stimulation Goals Tone;Pain     Manual Therapy   Manual Therapy Soft tissue mobilization;Myofascial release;Passive ROM   Manual therapy comments Supine, and L sidelying   Soft tissue mobilization STM to bilateral UT, B medial scap. border; pt. tender throughout    Myofascial Release TPR to R medial/ superior scapular border; pt. very ttp with limited tolerance  Noted Bil 9/10 pain following this which subsided    Passive ROM Gentle bilateral UT, Levator scap. stretch with mild overpressure from therapist in supine     Neck Exercises: Stretches   Upper Trapezius Stretch 2 reps;30 seconds   Upper Trapezius Stretch Limitations bilateral - no overpressure   Levator Stretch 2 reps;30 seconds  no overpressure    Corner Stretch 30 seconds;2 reps  3 versions   Other Neck Stretches Supine Bil posterior shoulder stretch x 30 sec each side    Other Neck Stretches B rhomboids stretch 2 x 30 sec  PT Education - 02/13/17 1241    Education provided Yes   Education Details Chest stretch at doorway, Rhomboids stretch, UT/rhomboids ball massage on wall    Person(s) Educated Patient   Methods Explanation;Demonstration;Verbal cues;Handout   Comprehension Verbalized understanding;Returned demonstration;Verbal cues required;Need further instruction          PT Short Term Goals - 02/10/17 0948      PT SHORT TERM GOAL #1   Title patient to be independent with initial HEP (02/28/17)   Status On-going     PT SHORT TERM GOAL #2   Title Patient to demonstrate AROM of both C-spine and L-spine to WNL without pain limiting (02/28/17)   Status On-going           PT Long Term Goals - 02/10/17 1032       PT LONG TERM GOAL #1   Title patient to be independent with advanced HEP (03/21/17)   Status On-going     PT LONG TERM GOAL #2   Title Patient to demonstrate good posture and body mechanics needed for all daily activities (03/21/17)   Status On-going     PT LONG TERM GOAL #3   Title Patient to report ability to return to leisure activities and lifting children without increase in pain (03/21/17)   Status On-going     PT LONG TERM GOAL #4   Title Patient to improve tissue quality of cervical and upper back musculature as noted by increased tissue length and reduced trigger points to allow for reduced pain/headache symtpoms (03/21/17)   Status On-going               Plan - 02/13/17 1007    Clinical Impression Statement Pt. doing well today.  Noted high pain levels with cervical ROM and stretching today with pt. continuing to be very ttp throughout bilateral UT, levator scap. areas.  Some TPR attempted over areas of most tenderness in R UT however, pt. unable to tolerate thus terminated.  Scapular strengthening activities not yet attempted in treatment due to high pain levels and tenderness in upper back musculature.  Treatment ending with cold pack and E-stim. as pt. noting good benefit from this.     PT Treatment/Interventions ADLs/Self Care Home Management;Cryotherapy;Electrical Stimulation;Moist Heat;Traction;Ultrasound;Neuromuscular re-education;Therapeutic exercise;Therapeutic activities;Functional mobility training;Patient/family education;Manual techniques;Passive range of motion;Vasopneumatic Device;Taping;Dry needling   PT Next Visit Plan Progress postural re-ed and progress supine on noodle; STM/TPR to upper back cervical musculature to decrease tone      Patient will benefit from skilled therapeutic intervention in order to improve the following deficits and impairments:  Decreased activity tolerance, Decreased range of motion, Decreased mobility, Decreased strength, Pain,  Impaired UE functional use  Visit Diagnosis: Cervicalgia  Pain in thoracic spine  Acute bilateral low back pain without sciatica  Other symptoms and signs involving the musculoskeletal system     Problem List Patient Active Problem List   Diagnosis Date Noted  . Iron deficiency anemia 11/05/2014  . Menometrorrhagia 11/05/2014  . Migraine with status migrainosus 10/14/2014  . 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 Meningitis 09/28/2013    Kermit BaloMicah Blakeley Margraf, PTA 02/13/17 12:53 PM  [facility] [place] [address]  [address] [place], [place], [address] Phone: [phone]   Fax:  [phone]  Name: Hanley BenDanielle Y Romaniello MRN: [id number] Date of Birth: May 12, 1981

## 2017-02-15 ENCOUNTER — Ambulatory Visit: Payer: BLUE CROSS/BLUE SHIELD

## 2017-02-15 DIAGNOSIS — M545 Low back pain, unspecified: Secondary | ICD-10-CM

## 2017-02-15 DIAGNOSIS — M542 Cervicalgia: Secondary | ICD-10-CM

## 2017-02-15 DIAGNOSIS — R29898 Other symptoms and signs involving the musculoskeletal system: Secondary | ICD-10-CM

## 2017-02-15 DIAGNOSIS — M546 Pain in thoracic spine: Secondary | ICD-10-CM | POA: Diagnosis not present
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
## 2017-02-20 ENCOUNTER — Ambulatory Visit: Payer: BLUE CROSS/BLUE SHIELD | Admitting: Physical Therapy

## 2017-02-20 DIAGNOSIS — R29898 Other symptoms and signs involving the musculoskeletal system: Secondary | ICD-10-CM

## 2017-02-20 DIAGNOSIS — M545 Low back pain, unspecified: Secondary | ICD-10-CM

## 2017-02-20 DIAGNOSIS — M542 Cervicalgia: Secondary | ICD-10-CM

## 2017-02-20 DIAGNOSIS — M546 Pain in thoracic spine: Secondary | ICD-10-CM

## 2017-02-20 NOTE — Therapy (Signed)
[facility] Outpatient Rehabilitation [facility] [address]  [address] [place], [place], [address] Phone: [phone]   Fax:  [phone]  Physical Therapy Treatment  Patient Details  Name: NYLIA GAVINA MRN: [id number] Date of Birth: Dec 20, 1980 Referring Provider: Norva Riffle, PA  Encounter Date: 02/20/2017      PT End of Session - 02/20/17 1019    Visit Number 5   Number of Visits 12   Date for PT Re-Evaluation 03/21/17   PT Start Time 0943  pt late   PT Stop Time 1025  moist heat at end of session   PT Time Calculation (min) 42 min   Activity Tolerance Patient tolerated treatment well   Behavior During Therapy [facility] for tasks assessed/performed      Past Medical History:  Diagnosis Date  . Anemia   . Iron deficiency anemia 11/05/2014  . Menometrorrhagia 11/05/2014  . Migraine   . Viral meningitis     Past Surgical History:  Procedure Laterality Date  . CESAREAN SECTION N/A 06/15/2013   Procedure: CESAREAN SECTION;  Surgeon: Philip Aspen, DO;  Location: [facility];  Service: Obstetrics;  Laterality: N/A;  . FETAL BLOOD TRANSFUSION  2014   With C-section    There were no vitals filed for this visit.      Subjective Assessment - 02/20/17 1017    Subjective Continues to have pain at R upper shoulder   Diagnostic tests MRI vs CT - negative   Currently in Pain? Yes   Pain Score 7    Pain Location Shoulder   Pain Orientation Right;Upper   Pain Descriptors / Indicators Aching;Sore   Pain Type Acute pain                         [facility] Adult PT Treatment/Exercise - 02/20/17 0001      Neck Exercises: Machines for Strengthening   UBE (Upper Arm Bike) UBE: lvl 2.0, 1 min each way     Neck Exercises: Theraband   Rows 15 reps;Red   Shoulder External Rotation 15 reps;Red   Horizontal ABduction 15 reps;Red     Modalities   Modalities Moist Heat     Moist Heat Therapy   Number Minutes Moist Heat 10 Minutes   Moist Heat  Location Cervical;Lumbar Spine     Manual Therapy   Manual Therapy Soft tissue mobilization   Manual therapy comments hooklying with bolster   Soft tissue mobilization STM to R UT and R scalenes          Trigger Point Dry Needling - 02/20/17 1125    Consent Given? Yes   Education Handout Provided Yes   Muscles Treated Upper Body Upper trapezius  R   Upper Trapezius Response Twitch reponse elicited;Palpable increased muscle length                PT Short Term Goals - 02/10/17 0948      PT SHORT TERM GOAL #1   Title patient to be independent with initial HEP (02/28/17)   Status On-going     PT SHORT TERM GOAL #2   Title Patient to demonstrate AROM of both C-spine and L-spine to WNL without pain limiting (02/28/17)   Status On-going           PT Long Term Goals - 02/10/17 1032      PT LONG TERM GOAL #1   Title patient to be independent with advanced HEP (03/21/17)  Status On-going     PT LONG TERM GOAL #2   Title Patient to demonstrate good posture and body mechanics needed for all daily activities (03/21/17)   Status On-going     PT LONG TERM GOAL #3   Title Patient to report ability to return to leisure activities and lifting children without increase in pain (03/21/17)   Status On-going     PT LONG TERM GOAL #4   Title Patient to improve tissue quality of cervical and upper back musculature as noted by increased tissue length and reduced trigger points to allow for reduced pain/headache symtpoms (03/21/17)   Status On-going               Plan - 02/20/17 1020    Clinical Impression Statement PT session today limited due to patient arriving late. Patient with continued reports of R shoulder and back pain today. Patient and PT discussing justification of dry needling today with patient in agreement to proceed. DN at R UT with twitch response noted as well as improved tissue quailty with manual therapy following treatment. Good initiation of periscpaular  strengthening today as patient plans to return to work in 1 week. Will continue to progress as tolerated by patient.    PT Treatment/Interventions ADLs/Self Care Home Management;Cryotherapy;Electrical Stimulation;Moist Heat;Traction;Ultrasound;Neuromuscular re-education;Therapeutic exercise;Therapeutic activities;Functional mobility training;Patient/family education;Manual techniques;Passive range of motion;Vasopneumatic Device;Taping;Dry needling   PT Next Visit Plan Progress postural re-ed and progress supine on noodle; STM/TPR to upper back cervical musculature to decrease tone; lumbopelvic flexibility/ROM and strengthening as tolerates   Consulted and Agree with Plan of Care Patient      Patient will benefit from skilled therapeutic intervention in order to improve the following deficits and impairments:  Decreased activity tolerance, Decreased range of motion, Decreased mobility, Decreased strength, Pain, Impaired UE functional use  Visit Diagnosis: Cervicalgia  Pain in thoracic spine  Acute bilateral low back pain without sciatica  Other symptoms and signs involving the musculoskeletal system     Problem List Patient Active Problem List   Diagnosis Date Noted  . Iron deficiency anemia 11/05/2014  . Menometrorrhagia 11/05/2014  . Migraine with status migrainosus 10/14/2014  . 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 Meningitis 09/28/2013     Kipp LaurenceStephanie R Del Overfelt, PT, DPT 02/20/17 11:26 AM   [facility] [place] [address]  [address] [place], [place], [address] Phone: [phone]   Fax:  [phone]  Name: Hanley BenDanielle Y Theroux MRN: [id number] Date of Birth: Jul 01, 1981

## 2017-02-20 NOTE — Patient Instructions (Signed)

## 2017-02-24 ENCOUNTER — Ambulatory Visit: Payer: BLUE CROSS/BLUE SHIELD

## 2017-02-24 DIAGNOSIS — M542 Cervicalgia: Secondary | ICD-10-CM | POA: Diagnosis not present

## 2017-02-24 DIAGNOSIS — M546 Pain in thoracic spine: Secondary | ICD-10-CM

## 2017-02-24 DIAGNOSIS — M545 Low back pain, unspecified: Secondary | ICD-10-CM

## 2017-02-24 DIAGNOSIS — M25511 Pain in right shoulder: Secondary | ICD-10-CM | POA: Diagnosis not present

## 2017-02-24 DIAGNOSIS — R29898 Other symptoms and signs involving the musculoskeletal system: Secondary | ICD-10-CM | POA: Diagnosis not present

## 2017-02-24 NOTE — Therapy (Signed)
[place] [address]  [place], [place], [address] Phone: [phone]   Fax:  [phone]  Physical Therapy Treatment  Patient Details  Name: [name] MRN: [id number] Date of Birth: November 27, 1980 Referring Provider: Raelyn Number, PA  Encounter Date: 02/24/2017      PT End of Session - 02/24/17 0949    Visit Number 6   Number of Visits 12   Date for PT Re-Evaluation 03/21/17   PT Start Time 4401  pt. arrived late    PT Stop Time 1032   PT Time Calculation (min) 48 min   Activity Tolerance Patient tolerated treatment well   Behavior During Therapy [facility] for tasks assessed/performed      Past Medical History:  Diagnosis Date  . Anemia   . Iron deficiency anemia 11/05/2014  . Menometrorrhagia 11/05/2014  . Migraine   . Viral meningitis     Past Surgical History:  Procedure Laterality Date  . CESAREAN SECTION N/A 06/15/2013   Procedure: CESAREAN SECTION;  Surgeon: Allyn Kenner, DO;  Location: [facility];  Service: Obstetrics;  Laterality: N/A;  . FETAL BLOOD TRANSFUSION  2014   With C-section    There were no vitals filed for this visit.      Subjective Assessment - 02/24/17 0946    Subjective Seeing MD later today.  Notes good benefit from DN to R-sided upper shoulder with lower pain levels.  States back is primary concern today.     Currently in Pain? Yes   Pain Score 2    Pain Location Shoulder   Pain Orientation Right;Upper   Pain Descriptors / Indicators Aching;Sore   Pain Type Acute pain   Pain Onset More than a month ago   Pain Frequency Intermittent   Aggravating Factors  lifting    Pain Score 5   Pain Location Back   Pain Orientation Lower;Right;Left   Pain Descriptors / Indicators Aching   Pain Type Acute pain   Pain Onset More than a month ago   Aggravating Factors  lifting, bending            [facility] PT Assessment - 02/24/17 1006      ROM / Strength   AROM / PROM /  Strength AROM     AROM   Overall AROM Comments slight pain increase with L cervical rotation, lumbar extension and flexion    AROM Assessment Site Cervical;Lumbar   Lumbar Flexion fingertips to anterior ankle joint - low back and thoracic pain  some increased pain    Lumbar Extension 25% limited - increased pain    Lumbar - Right Side Bend fingertip to joint line   Lumbar - Left Side Bend fingertip to joint line - tenderness   Lumbar - Right Rotation WFL    Lumbar - Left Rotation [facility]                      [facility] Adult PT Treatment/Exercise - 02/24/17 0957      Self-Care   Self-Care Other Self-Care Comments   Other Self-Care Comments  Discussion of need for postural awareness and frequent repositioning to avoid excessive stress on lumbar and cervical spine while sitting at work       Lumbar Exercises: Aerobic   UBE (Upper Arm Bike) UBE: lvl 1.0, 1 min each way     Lumbar Exercises: Machines for Strengthening   Other Lumbar Machine Exercise BATCA low row 20# x 15  reps   Other Lumbar Machine Exercise B single arm BATCA row 15# x 15 reps     Lumbar Exercises: Supine   Isometric Hip Flexion 10 reps;5 seconds     Lumbar Exercises: Sidelying   Other Sidelying Lumbar Exercises Open book stretch 5" x 5 reps each side      Lumbar Exercises: Prone   Other Prone Lumbar Exercises Prone childs pose x 30 sec in mult-ankles x 30 sec eahc      Lumbar Exercises: Quadruped   Madcat/Old Horse 10 reps   Madcat/Old Horse Limitations with peanut p-ball    Opposite Arm/Leg Raise 10 reps;Right arm/Left leg;Left arm/Right leg;3 seconds   Opposite Arm/Leg Raise Limitations peanut      Moist Heat Therapy   Number Minutes Moist Heat 10 Minutes   Moist Heat Location Lumbar Spine     Electrical Stimulation   Electrical Stimulation Location lumbar    Electrical Stimulation Action IFC   Electrical Stimulation Parameters intensity to pt. tolerance- 10'    Electrical Stimulation Goals  Tone;Pain     Manual Therapy   Manual Therapy Soft tissue mobilization   Manual therapy comments seated    Soft tissue mobilization Concentrated STM/DTM to R upper trapezius over area of tenderness and previously DN area to promote decrease in tension                PT Education - 02/24/17 1026    Education provided Yes   Education Details Levator scap. stretch, row with red TB issued to pt., extension/row with red TB   Person(s) Educated Patient   Methods Explanation;Demonstration;Verbal cues;Handout   Comprehension Verbalized understanding;Returned demonstration;Verbal cues required;Need further instruction          PT Short Term Goals - 02/24/17 1044      PT SHORT TERM GOAL #1   Title patient to be independent with initial HEP (02/28/17)   Status Achieved     PT SHORT TERM GOAL #2   Title Patient to demonstrate AROM of both C-spine and L-spine to WNL without pain limiting (02/28/17)   Status Partially Met  7.20.18: pt. somewhat limited still with lumbar extension            PT Long Term Goals - 02/10/17 1032      PT LONG TERM GOAL #1   Title patient to be independent with advanced HEP (03/21/17)   Status On-going     PT LONG TERM GOAL #2   Title Patient to demonstrate good posture and body mechanics needed for all daily activities (03/21/17)   Status On-going     PT LONG TERM GOAL #3   Title Patient to report ability to return to leisure activities and lifting children without increase in pain (03/21/17)   Status On-going     PT LONG TERM GOAL #4   Title Patient to improve tissue quality of cervical and upper back musculature as noted by increased tissue length and reduced trigger points to allow for reduced pain/headache symtpoms (03/21/17)   Status On-going               Plan - 02/24/17 7056    Clinical Impression Statement Pt. arrived late thus treatment limited.  Notes good benefit from DN to R upper shoulder with lower pain levels since last  treatment.  States, "back is my main problem today".  Treatment focusing on lumbopelvic strengthening and stretching to pt. tolerance.  Pt. performed well with good overall drop in LBP following therex.  E-stim/moist heat applied to lumbar spine for decrease in post-exercise pain/tone.  Scapular strengthening activities added to HEP today due to pt. tolerating these well last treatment.  Pt. pain levels seem to be decreasing with therapy with lumbar AROM somewhat improved today with less pain.  Some discussion with pt. today regarding need for consistent repositioning and postural awareness with sitting desk work and work related tasks to relieve stresses on cervical and lumbar spine.  Pt. verbalizing awareness of this.   PT Treatment/Interventions ADLs/Self Care Home Management;Cryotherapy;Electrical Stimulation;Moist Heat;Traction;Ultrasound;Neuromuscular re-education;Therapeutic exercise;Therapeutic activities;Functional mobility training;Patient/family education;Manual techniques;Passive range of motion;Vasopneumatic Device;Taping;Dry needling   PT Next Visit Plan Tolerance to scapular strengthening HEP; further DN to cervical spine prn; STM/TPR to upper back cervical musculature to decrease tone; lumbopelvic flexibility/ROM and strengthening as tolerates      Patient will benefit from skilled therapeutic intervention in order to improve the following deficits and impairments:  Decreased activity tolerance, Decreased range of motion, Decreased mobility, Decreased strength, Pain, Impaired UE functional use  Visit Diagnosis: Cervicalgia  Pain in thoracic spine  Acute bilateral low back pain without sciatica  Other symptoms and signs involving the musculoskeletal system     Problem List Patient Active Problem List   Diagnosis Date Noted  . Iron deficiency anemia 11/05/2014  . Menometrorrhagia 11/05/2014  . Migraine with status migrainosus 10/14/2014  . 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  Meningitis 09/28/2013    Bess Harvest, PTA 02/24/17 10:46 AM  [facility] [address]  [place], [place], [address] Phone: [phone]   Fax:  [phone]  Name: [name] MRN: [id number] Date of Birth: Nov 22, 1980

## 2017-02-27 ENCOUNTER — Ambulatory Visit: Payer: BLUE CROSS/BLUE SHIELD

## 2017-03-02 ENCOUNTER — Ambulatory Visit: Payer: BLUE CROSS/BLUE SHIELD | Admitting: Physical Therapy

## 2017-03-02 DIAGNOSIS — M545 Low back pain, unspecified: Secondary | ICD-10-CM

## 2017-03-02 DIAGNOSIS — M546 Pain in thoracic spine: Secondary | ICD-10-CM

## 2017-03-02 DIAGNOSIS — M542 Cervicalgia: Secondary | ICD-10-CM

## 2017-03-02 DIAGNOSIS — R29898 Other symptoms and signs involving the musculoskeletal system: Secondary | ICD-10-CM

## 2017-03-02 NOTE — Therapy (Addendum)
[facility] [address]  [place], [place], [address] Phone: [phone]   Fax:  [phone]  Physical Therapy Treatment  Patient Details  Name: [name] MRN: [id number] Date of Birth: 1980/09/04 Referring Provider: Raelyn Number, PA  Encounter Date: 03/02/2017      PT End of Session - 03/02/17 0941    Visit Number 7   Number of Visits 12   Date for PT Re-Evaluation 03/21/17   PT Start Time 0936   PT Stop Time 1030  moist heat at end of session   PT Time Calculation (min) 54 min   Activity Tolerance Patient tolerated treatment well   Behavior During Therapy [facility] for tasks assessed/performed      Past Medical History:  Diagnosis Date  . Anemia   . Iron deficiency anemia 11/05/2014  . Menometrorrhagia 11/05/2014  . Migraine   . Viral meningitis     Past Surgical History:  Procedure Laterality Date  . CESAREAN SECTION N/A 06/15/2013   Procedure: CESAREAN SECTION;  Surgeon: Allyn Kenner, DO;  Location: [facility];  Service: Obstetrics;  Laterality: N/A;  . FETAL BLOOD TRANSFUSION  2014   With C-section    There were no vitals filed for this visit.      Subjective Assessment - 03/02/17 0940    Subjective Patient reporitng some mid back/scapula pain today - has been relased from MD    Diagnostic tests MRI vs CT - negative   Currently in Pain? Yes   Pain Score 8    Pain Location Scapula   Pain Orientation Right;Mid   Pain Descriptors / Indicators Aching;Sore;Discomfort   Pain Type Acute pain                         [facility] Adult PT Treatment/Exercise - 03/02/17 0942      Neck Exercises: Machines for Strengthening   UBE (Upper Arm Bike) L3 x 6 min (3/3)     Neck Exercises: Theraband   Rows 15 reps;Green   Horizontal ABduction 15 reps;Green     Lumbar Exercises: Stretches   Quadruped Mid Back Stretch Limitations childs pose - slight overpressure by PT at lumbar spine 2 x 30  sec     Lumbar Exercises: Quadruped   Madcat/Old Horse 10 reps   Madcat/Old Horse Limitations 5 sec hold     Modalities   Modalities Moist Heat     Moist Heat Therapy   Number Minutes Moist Heat 15 Minutes   Moist Heat Location Cervical;Lumbar Spine     Manual Therapy   Manual Therapy Joint mobilization;Soft tissue mobilization   Manual therapy comments patient prone   Joint Mobilization Grade II-III CPAs from appox C3-T1, L1-L5   Soft tissue mobilization STMt o R UT, R thoracic paraspinals, R rhomboid, R LS     Neck Exercises: Stretches   Upper Trapezius Stretch 3 reps;30 seconds   Upper Trapezius Stretch Limitations bilateral          Trigger Point Dry Needling - 03/02/17 1005    Consent Given? Yes   Muscles Treated Upper Body Upper trapezius;Rhomboids  R side only   Upper Trapezius Response Twitch reponse elicited;Palpable increased muscle length   Rhomboids Response Twitch response elicited;Palpable increased muscle length                PT Short Term Goals - 02/24/17 1044      PT SHORT TERM GOAL #1  Title patient to be independent with initial HEP (02/28/17)   Status Achieved     PT SHORT TERM GOAL #2   Title Patient to demonstrate AROM of both C-spine and L-spine to WNL without pain limiting (02/28/17)   Status Partially Met  7.20.18: pt. somewhat limited still with lumbar extension            PT Long Term Goals - 02/10/17 1032      PT LONG TERM GOAL #1   Title patient to be independent with advanced HEP (03/21/17)   Status On-going     PT LONG TERM GOAL #2   Title Patient to demonstrate good posture and body mechanics needed for all daily activities (03/21/17)   Status On-going     PT LONG TERM GOAL #3   Title Patient to report ability to return to leisure activities and lifting children without increase in pain (03/21/17)   Status On-going     PT LONG TERM GOAL #4   Title Patient to improve tissue quality of cervical and upper back  musculature as noted by increased tissue length and reduced trigger points to allow for reduced pain/headache symtpoms (03/21/17)   Status On-going               Plan - 03/02/17 1025    Clinical Impression Statement patient doing well today - has returned to work and feeling well. Discussion today on ergonomics at workstation to maximize function with reduced pain. DN today to R UT, R homboid area today with good trwitch response noted as well as improved tissue quality with palpation. Patient to continue to progress as tolerated.    PT Treatment/Interventions ADLs/Self Care Home Management;Cryotherapy;Electrical Stimulation;Moist Heat;Traction;Ultrasound;Neuromuscular re-education;Therapeutic exercise;Therapeutic activities;Functional mobility training;Patient/family education;Manual techniques;Passive range of motion;Vasopneumatic Device;Taping;Dry needling   PT Next Visit Plan Tolerance to scapular strengthening HEP; further DN to cervical spine prn; STM/TPR to upper back cervical musculature to decrease tone; lumbopelvic flexibility/ROM and strengthening as tolerates   Consulted and Agree with Plan of Care Patient      Patient will benefit from skilled therapeutic intervention in order to improve the following deficits and impairments:  Decreased activity tolerance, Decreased range of motion, Decreased mobility, Decreased strength, Pain, Impaired UE functional use  Visit Diagnosis: Cervicalgia  Pain in thoracic spine  Acute bilateral low back pain without sciatica  Other symptoms and signs involving the musculoskeletal system     Problem List Patient Active Problem List   Diagnosis Date Noted  . Iron deficiency anemia 11/05/2014  . Menometrorrhagia 11/05/2014  . Migraine with status migrainosus 10/14/2014  . 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 Meningitis 09/28/2013     Lanney Gins, PT, DPT 03/02/17 10:59 AM   [facility] [address]  [place], [place], [address] Phone: [phone]   Fax:  [phone]  Name: [name] MRN: [id number] Date of Birth: Jun 22, 1981

## 2017-03-06 ENCOUNTER — Ambulatory Visit: Payer: BLUE CROSS/BLUE SHIELD

## 2017-03-06 DIAGNOSIS — M546 Pain in thoracic spine: Secondary | ICD-10-CM

## 2017-03-06 DIAGNOSIS — M542 Cervicalgia: Secondary | ICD-10-CM | POA: Diagnosis not present

## 2017-03-06 DIAGNOSIS — R29898 Other symptoms and signs involving the musculoskeletal system: Secondary | ICD-10-CM

## 2017-03-06 DIAGNOSIS — M545 Low back pain, unspecified: Secondary | ICD-10-CM

## 2017-03-06 NOTE — Therapy (Addendum)
[facility] [address]  [place], [place], [address] Phone: [phone]   Fax:  [phone]  Physical Therapy Treatment  Patient Details  Name: [name] MRN: [id number] Date of Birth: 21-Sep-1980 Referring Provider: Raelyn Number, PA  Encounter Date: 03/06/2017      PT End of Session - 03/06/17 0943    Visit Number 8   Number of Visits 12   Date for PT Re-Evaluation 03/21/17   PT Start Time 0941  pt. arrived late    PT Stop Time 1030   PT Time Calculation (min) 49 min   Activity Tolerance Patient tolerated treatment well   Behavior During Therapy [facility] for tasks assessed/performed      Past Medical History:  Diagnosis Date  . Anemia   . Iron deficiency anemia 11/05/2014  . Menometrorrhagia 11/05/2014  . Migraine   . Viral meningitis     Past Surgical History:  Procedure Laterality Date  . CESAREAN SECTION N/A 06/15/2013   Procedure: CESAREAN SECTION;  Surgeon: Allyn Kenner, DO;  Location: [facility];  Service: Obstetrics;  Laterality: N/A;  . FETAL BLOOD TRANSFUSION  2014   With C-section    There were no vitals filed for this visit.      Subjective Assessment - 03/06/17 0944    Subjective Pt. reporting R upper shoulder has been hurting at work however some relief with stretching.     Currently in Pain? Yes   Pain Score 3    Pain Location Scapula   Pain Orientation Right;Upper   Pain Descriptors / Indicators Aching   Pain Type Acute pain   Pain Onset More than a month ago   Pain Frequency Intermittent   Aggravating Factors  lifting, sitting typing   Multiple Pain Sites No                         [facility] Adult PT Treatment/Exercise - 03/06/17 0950      Self-Care   Self-Care Other Self-Care Comments   Other Self-Care Comments  Self-ball massage with red med ball to R UT area over area of most tenderness for use at home to relief tension and pain     Neck Exercises:  Machines for Strengthening   UBE (Upper Arm Bike) L3 x 6 min (3/3)     Neck Exercises: Theraband   Rows 15 reps;Green  5" hold    Horizontal ABduction 15 reps;Green   Other Theraband Exercises B shoulder ER with yellow TB 3" x 15 reps     Moist Heat Therapy   Number Minutes Moist Heat 15 Minutes   Moist Heat Location Cervical;Lumbar Spine     Electrical Stimulation   Electrical Stimulation Location lumbar    Electrical Stimulation Action IFC   Electrical Stimulation Parameters intensity to pt. tolerance - 15'    Electrical Stimulation Goals Tone;Pain     Manual Therapy   Manual Therapy Myofascial release;Passive ROM;Soft tissue mobilization   Manual therapy comments seated and supine    Soft tissue mobilization B STM to R UT over area of most tenderness and B UT, cervical paraspinals, and levator scap.; palpable "muscular knot" in R upper trap   Myofascial Release R upper trap. TPR; pt. very ttp here   Passive ROM gentle therapist assisted B UT stretch x 1 min each way     Neck Exercises: Stretches   Upper Trapezius Stretch 3 reps;30 seconds   Upper  Trapezius Stretch Limitations R-sided only due to tightness here   Levator Stretch 3 reps;30 seconds  R-sided only due to tightness here                  PT Short Term Goals - 02/24/17 1044      PT SHORT TERM GOAL #1   Title patient to be independent with initial HEP (02/28/17)   Status Achieved     PT SHORT TERM GOAL #2   Title Patient to demonstrate AROM of both C-spine and L-spine to WNL without pain limiting (02/28/17)   Status Partially Met  7.20.18: pt. somewhat limited still with lumbar extension            PT Long Term Goals - 02/10/17 1032      PT LONG TERM GOAL #1   Title patient to be independent with advanced HEP (03/21/17)   Status On-going     PT LONG TERM GOAL #2   Title Patient to demonstrate good posture and body mechanics needed for all daily activities (03/21/17)   Status On-going     PT  LONG TERM GOAL #3   Title Patient to report ability to return to leisure activities and lifting children without increase in pain (03/21/17)   Status On-going     PT LONG TERM GOAL #4   Title Patient to improve tissue quality of cervical and upper back musculature as noted by increased tissue length and reduced trigger points to allow for reduced pain/headache symtpoms (03/21/17)   Status On-going               Plan - 03/06/17 0943    Clinical Impression Statement Pt. doing well today however noting some upper R shoulder pain with prolonged sitting at work.  Discussion with pt. regarding need for frequent change in positioning and stretching throughout work day to relieve tension and pain in upper trapezius muscles.  Some manual STM/TPR to R UT over area of most tenderness with some relief.  Pt. still with palpable muscular "knot" in this area and very ttp.  Tolerated progression in scapular strengthening activities well today and ended treatment with E-stim/moist heat to upper shoulder musculature to decrease post-exercise tone and pain.     PT Treatment/Interventions ADLs/Self Care Home Management;Cryotherapy;Electrical Stimulation;Moist Heat;Traction;Ultrasound;Neuromuscular re-education;Therapeutic exercise;Therapeutic activities;Functional mobility training;Patient/family education;Manual techniques;Passive range of motion;Vasopneumatic Device;Taping;Dry needling   PT Next Visit Plan Further DN to cervical spine prn; STM/TPR to upper back cervical musculature to decrease tone; lumbopelvic flexibility/ROM and strengthening as tolerates      Patient will benefit from skilled therapeutic intervention in order to improve the following deficits and impairments:  Decreased activity tolerance, Decreased range of motion, Decreased mobility, Decreased strength, Pain, Impaired UE functional use  Visit Diagnosis: Cervicalgia  Pain in thoracic spine  Acute bilateral low back pain without  sciatica  Other symptoms and signs involving the musculoskeletal system     Problem List Patient Active Problem List   Diagnosis Date Noted  . Iron deficiency anemia 11/05/2014  . Menometrorrhagia 11/05/2014  . Migraine with status migrainosus 10/14/2014  . 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 Meningitis 09/28/2013    Bess Harvest, PTA 03/06/17 11:48 AM   PHYSICAL THERAPY DISCHARGE SUMMARY  Visits from Start of Care: 8  Current functional level related to goals / functional outcomes: See above; cx last appointment due to migraine - seen in ED, did not return to PT   Remaining deficits: See above   Education / Equipment: HEP  Plan: Patient agrees to discharge.  Patient goals were partially met. Patient is being discharged due to not returning since the last visit.  ?????    Lanney Gins, PT, DPT 04/11/17 4:39 PM    [facility] [address]  [address] [place], [place], [address] Phone: [phone]   Fax:  [phone]  Name: [name] MRN: [id number] Date of Birth: [birth year]

## 2017-03-09 ENCOUNTER — Encounter (HOSPITAL_BASED_OUTPATIENT_CLINIC_OR_DEPARTMENT_OTHER): Payer: Self-pay | Admitting: *Deleted

## 2017-03-09 ENCOUNTER — Emergency Department (HOSPITAL_BASED_OUTPATIENT_CLINIC_OR_DEPARTMENT_OTHER)
Admission: EM | Admit: 2017-03-09 | Discharge: 2017-03-09 | Disposition: A | Discharge status: Home / Self Care | Payer: BLUE CROSS/BLUE SHIELD | Attending: Emergency Medicine | Admitting: Emergency Medicine

## 2017-03-09 ENCOUNTER — Ambulatory Visit: Payer: BLUE CROSS/BLUE SHIELD

## 2017-03-09 DIAGNOSIS — G43009 Migraine without aura, not intractable, without status migrainosus: Secondary | ICD-10-CM

## 2017-03-09 DIAGNOSIS — Z79899 Other long term (current) drug therapy: Secondary | ICD-10-CM | POA: Insufficient documentation

## 2017-03-09 DIAGNOSIS — G43909 Migraine, unspecified, not intractable, without status migrainosus: Secondary | ICD-10-CM | POA: Insufficient documentation

## 2017-03-09 MED ORDER — DIPHENHYDRAMINE HCL 50 MG/ML IJ SOLN
25.0000 mg | Freq: Once | INTRAMUSCULAR | Status: AC
  Administered 2017-03-09: 25 mg via INTRAVENOUS
  Filled 2017-03-09: qty 1

## 2017-03-09 MED ORDER — KETOROLAC TROMETHAMINE 30 MG/ML IJ SOLN
30.0000 mg | Freq: Once | INTRAMUSCULAR | Status: AC
  Administered 2017-03-09: 30 mg via INTRAVENOUS
  Filled 2017-03-09: qty 1

## 2017-03-09 MED ORDER — SODIUM CHLORIDE 0.9 % IV BOLUS (SEPSIS)
1000.0000 mL | Freq: Once | INTRAVENOUS | Status: AC
  Administered 2017-03-09: 1000 mL via INTRAVENOUS

## 2017-03-09 MED ORDER — PROCHLORPERAZINE EDISYLATE 5 MG/ML IJ SOLN
10.0000 mg | Freq: Once | INTRAMUSCULAR | Status: AC
  Administered 2017-03-09: 10 mg via INTRAVENOUS
  Filled 2017-03-09: qty 2

## 2017-03-09 NOTE — ED Triage Notes (Signed)
Pt reports ha x Monday to left side of her head, her usual migraine sx, denies any other c/o.

## 2017-03-09 NOTE — ED Provider Notes (Signed)
[facility] Provider Note   CSN: [id number] Arrival date & time: 03/09/17  0941     History   Chief Complaint Chief Complaint  Patient presents with  . Migraine    HPI [name] is a 36 y.o. female.  HPI  Pt with hx of migraines presenting with c/o left sided headache that feels similar to her prior migraines.  She states headache began several days ago.  Yesterday she developed some nausea and headache got worse.  She has sensitivity to light and sound.  No fever, no neck pain- has hx of meningitis in the past but this feels very different to her and just like her prior migraines.  There are no other associated systemic symptoms, there are no other alleviating or modifying factors.   Past Medical History:  Diagnosis Date  . Anemia   . Iron deficiency anemia 11/05/2014  . Menometrorrhagia 11/05/2014  . Migraine   . Viral meningitis     Patient Active Problem List   Diagnosis Date Noted  . Iron deficiency anemia 11/05/2014  . Menometrorrhagia 11/05/2014  . Migraine with status migrainosus 10/14/2014  . 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 Meningitis 09/28/2013    Past Surgical History:  Procedure Laterality Date  . CESAREAN SECTION N/A 06/15/2013   Procedure: CESAREAN SECTION;  Surgeon: Philip AspenSidney Callahan, DO;  Location: [facility];  Service: Obstetrics;  Laterality: N/A;  . FETAL BLOOD TRANSFUSION  2014   With C-section    OB History    Gravida Para Term Preterm AB Living   2 2 2     2    SAB TAB Ectopic Multiple Live Births           2       Home Medications    Prior to Admission medications   Medication Sig Start Date End Date Taking? Authorizing Provider  benzonatate (TESSALON) 100 MG capsule Take 1 capsule (100 mg total) by mouth every 8 (eight) hours. 06/11/16   Renne CriglerGeiple, Joshua, PA-C  diphenhydrAMINE (BENADRYL) 25 MG tablet Take 25 mg by mouth every 6 (six) hours as needed for allergies.     [provider]  eletriptan (RELPAX) 40 MG  tablet Take 40 mg by mouth as needed for migraine or headache. May repeat in 2 hours if headache persists or recurs.    [provider]  ibuprofen (ADVIL,MOTRIN) 200 MG tablet Take 800 mg by mouth every 6 (six) hours as needed for moderate pain.    [provider]  metoprolol succinate (TOPROL-XL) 50 MG 24 hr tablet Take 50 mg by mouth daily. Pt states "I do not take like I should" 08/18/14   [provider]  nortriptyline (PAMELOR) 10 MG capsule Take 2 capsules (20 mg total) by mouth at bedtime. 11/17/14   Anson FretAhern, Antonia B, MD  ondansetron (ZOFRAN ODT) 8 MG disintegrating tablet Take 1 tablet (8 mg total) by mouth every 8 (eight) hours as needed for nausea or vomiting. 09/24/14   Azalia Bilisampos, Kevin, MD  oxyCODONE-acetaminophen (PERCOCET/ROXICET) 5-325 MG per tablet Take 1-2 tablets by mouth every 6 (six) hours as needed for severe pain. 03/29/15   Benjiman CorePickering, Nathan, MD  prochlorperazine (COMPAZINE) 10 MG tablet Take 1 tablet (10 mg total) by mouth 2 (two) times daily as needed for nausea or vomiting. 03/29/15   Benjiman CorePickering, Nathan, MD  SUMAtriptan-naproxen (TREXIMET) 85-500 MG per tablet Take 1 tablet by mouth every 2 (two) hours as needed for migraine. PRN    [provider]  valACYclovir (VALTREX) 500 MG tablet Take 500 mg by mouth 2 (two) times daily.    [provider]    Family History Family History  Problem Relation Age of Onset  . Cirrhosis Father   . Alcoholism Father   . Breast cancer Maternal Grandmother        great-grandmother  . Breast cancer Cousin   . Diabetes Paternal Grandfather     Social History Social History  Substance Use Topics  . Smoking status: Never Smoker  . Smokeless tobacco: Never Used     Comment: NEVER USED TOBACCO  . Alcohol use 0.0 oz/week     Comment: Very rare (1/month)     Allergies   Citric acid; Latex; and Penicillins cross reactors   Review of Systems Review of Systems  ROS reviewed and all otherwise  negative except for mentioned in HPI   Physical Exam Updated Vital Signs BP 115/81 (BP Location: Left Arm)   Pulse 72   Temp 98.9 F (37.2 C) (Oral)   Resp 16   Ht 5\' 7"  (1.702 m)   Wt 78.5 kg (173 lb)   LMP 02/24/2017   SpO2 98%   BMI 27.10 kg/m  Vitals reviewed Physical Exam Physical Examination: General appearance - alert, well appearing, and in no distress Mental status - alert, oriented to person, place, and time Eyes - no conjunctival injection, no scleral icterus Mouth - mucous membranes moist, pharynx normal without lesions Neck - supple, no significant adenopathy Chest - clear to auscultation, no wheezes, rales or rhonchi, symmetric air entry Heart - normal rate, regular rhythm, normal S1, S2, no murmurs, rubs, clicks or gallops Neurological - alert, oriented, normal speech Extremities - peripheral pulses normal, no pedal edema, no clubbing or cyanosis Skin - normal coloration and turgor, no rashes  ED Treatments / Results  Labs (all labs ordered are listed, but only abnormal results are displayed) Labs Reviewed - No data to display  EKG  EKG Interpretation None       Radiology No results found.  Procedures Procedures (including critical care time)  Medications Ordered in ED Medications  sodium chloride 0.9 % bolus 1,000 mL (0 mLs Intravenous Stopped 03/09/17 1215)  prochlorperazine (COMPAZINE) injection 10 mg (10 mg Intravenous Given 03/09/17 1026)  diphenhydrAMINE (BENADRYL) injection 25 mg (25 mg Intravenous Given 03/09/17 1026)  ketorolac (TORADOL) 30 MG/ML injection 30 mg (30 mg Intravenous Given 03/09/17 1026)     Initial Impression / Assessment and Plan / ED Course  I have reviewed the triage vital signs and the nursing notes.  Pertinent labs & imaging results that were available during my care of the patient were reviewed by me and considered in my medical decision making (see chart for details).    12:22 PM pt is resting comfortably on  recheck  Pt presenting with c/o migraine headache similar to her prior migraines.  Normal neuro exam.  No fever, neck pain to suggest meningitis.  Pt feeling improved after migraine cocktail.  Discharged with strict return precautions.  Pt agreeable with plan.  Final Clinical Impressions(s) / ED Diagnoses   Final diagnoses:  Migraine without aura and without status migrainosus, not intractable    New Prescriptions Discharge Medication List as of 03/09/2017 12:23 PM       Mabe, Latanya MaudlinMartha L, MD 03/09/17 1547

## 2017-03-09 NOTE — ED Notes (Signed)
Pt smiling, texting on her phone and watching cartoons on tv, cont rate pain at 10/10. When this rn turns off television, pt states "No, turn it back on I was watching that."

## 2017-03-09 NOTE — Discharge Instructions (Signed)
Return to the ED with any concerns including weakness of arms or legs, changes in vision or speech, vomiting and not able to keep down liquids, decreased level of alertness/lethargy, or any other alarming symptoms °

## 2017-03-09 NOTE — ED Notes (Signed)
Pt tolerated sprite well. 

## 2017-08-18 DIAGNOSIS — Z20818 Contact with and (suspected) exposure to other bacterial communicable diseases: Secondary | ICD-10-CM | POA: Diagnosis not present

## 2017-08-18 DIAGNOSIS — J029 Acute pharyngitis, unspecified: Secondary | ICD-10-CM | POA: Diagnosis not present

## 2017-08-18 DIAGNOSIS — H6122 Impacted cerumen, left ear: Secondary | ICD-10-CM | POA: Diagnosis not present

## 2017-08-18 DIAGNOSIS — J069 Acute upper respiratory infection, unspecified: Secondary | ICD-10-CM | POA: Diagnosis not present

## 2018-01-30 DIAGNOSIS — Z01419 Encounter for gynecological examination (general) (routine) without abnormal findings: Secondary | ICD-10-CM | POA: Diagnosis not present

## 2018-01-30 DIAGNOSIS — Z01411 Encounter for gynecological examination (general) (routine) with abnormal findings: Secondary | ICD-10-CM | POA: Diagnosis not present

## 2018-01-30 DIAGNOSIS — Z3202 Encounter for pregnancy test, result negative: Secondary | ICD-10-CM | POA: Diagnosis not present

## 2018-01-30 DIAGNOSIS — Z6828 Body mass index (BMI) 28.0-28.9, adult: Secondary | ICD-10-CM | POA: Diagnosis not present

## 2018-01-30 DIAGNOSIS — Z113 Encounter for screening for infections with a predominantly sexual mode of transmission: Secondary | ICD-10-CM | POA: Diagnosis not present

## 2018-01-30 DIAGNOSIS — Z7251 High risk heterosexual behavior: Secondary | ICD-10-CM | POA: Diagnosis not present

## 2018-01-31 DIAGNOSIS — L700 Acne vulgaris: Secondary | ICD-10-CM | POA: Diagnosis not present

## 2018-04-21 DIAGNOSIS — J069 Acute upper respiratory infection, unspecified: Secondary | ICD-10-CM | POA: Diagnosis not present

## 2018-04-21 DIAGNOSIS — J019 Acute sinusitis, unspecified: Secondary | ICD-10-CM | POA: Diagnosis not present

## 2018-08-13 ENCOUNTER — Emergency Department (HOSPITAL_BASED_OUTPATIENT_CLINIC_OR_DEPARTMENT_OTHER)
Admission: EM | Admit: 2018-08-13 | Discharge: 2018-08-13 | Disposition: A | Discharge status: Home / Self Care | Payer: BLUE CROSS/BLUE SHIELD | Attending: Emergency Medicine | Admitting: Emergency Medicine

## 2018-08-13 ENCOUNTER — Other Ambulatory Visit: Payer: Self-pay

## 2018-08-13 ENCOUNTER — Encounter (HOSPITAL_BASED_OUTPATIENT_CLINIC_OR_DEPARTMENT_OTHER): Payer: Self-pay | Admitting: Adult Health

## 2018-08-13 DIAGNOSIS — G43109 Migraine with aura, not intractable, without status migrainosus: Secondary | ICD-10-CM | POA: Insufficient documentation

## 2018-08-13 DIAGNOSIS — G43009 Migraine without aura, not intractable, without status migrainosus: Secondary | ICD-10-CM

## 2018-08-13 DIAGNOSIS — Z79899 Other long term (current) drug therapy: Secondary | ICD-10-CM | POA: Insufficient documentation

## 2018-08-13 DIAGNOSIS — G43909 Migraine, unspecified, not intractable, without status migrainosus: Secondary | ICD-10-CM | POA: Diagnosis not present

## 2018-08-13 DIAGNOSIS — R51 Headache: Secondary | ICD-10-CM | POA: Diagnosis not present

## 2018-08-13 MED ORDER — DIPHENHYDRAMINE HCL 50 MG/ML IJ SOLN
12.5000 mg | Freq: Once | INTRAMUSCULAR | Status: AC
  Administered 2018-08-13: 12.5 mg via INTRAVENOUS
  Filled 2018-08-13: qty 1

## 2018-08-13 MED ORDER — SODIUM CHLORIDE 0.9 % IV BOLUS
1000.0000 mL | Freq: Once | INTRAVENOUS | Status: AC
  Administered 2018-08-13: 1000 mL via INTRAVENOUS

## 2018-08-13 MED ORDER — METOCLOPRAMIDE HCL 5 MG/ML IJ SOLN
10.0000 mg | Freq: Once | INTRAMUSCULAR | Status: AC
  Administered 2018-08-13: 10 mg via INTRAVENOUS
  Filled 2018-08-13: qty 2

## 2018-08-13 NOTE — ED Provider Notes (Signed)
[facility] EMERGENCY DEPARTMENT Provider Note   CSN: [id number] Arrival date & time: 08/13/18  1415   History   Chief Complaint Chief Complaint  Patient presents with  . Migraine    HPI Hanley BenDanielle Y Butzin is a 38 y.o. female with past medical history significant for migraine, chronic anemia, meningitis who presents for evaluation of headache.  Patient states she has had a headache located to the right temporal region as well as her right forehead x3 days.  States is began gradually.  States this feels similar to her previous migraines.  Admits to photophobia, nausea.  Has been taking Advil for migraine without relief of her symptoms.  Rates her pain a 9/10.  Pain does not radiate.  Denies fever, chills, emesis, vision changes, eye pain, neck stiffness, neck rigidity, slurred speech, unilateral weakness, chest pain, shortness of breath, abdominal pain, diarrhea or dysuria.  Denies sudden onset thunderclap headache.  Has not recently been sick.  History obtained from patient.  No interpreter was used.  HPI  Past Medical History:  Diagnosis Date  . Anemia   . Iron deficiency anemia 11/05/2014  . Menometrorrhagia 11/05/2014  . Migraine   . Viral meningitis     Patient Active Problem List   Diagnosis Date Noted  . Iron deficiency anemia 11/05/2014  . Menometrorrhagia 11/05/2014  . Migraine with status migrainosus 10/14/2014  . 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 Meningitis 09/28/2013    Past Surgical History:  Procedure Laterality Date  . CESAREAN SECTION N/A 06/15/2013   Procedure: CESAREAN SECTION;  Surgeon: Philip AspenSidney Callahan, DO;  Location: [facility];  Service: Obstetrics;  Laterality: N/A;  . FETAL BLOOD TRANSFUSION  2014   With C-section     OB History    Gravida  2   Para  2   Term  2   Preterm      AB      Living  2     SAB      TAB      Ectopic      Multiple      Live Births  2            Home Medications    Prior to Admission  medications   Medication Sig Start Date End Date Taking? Authorizing Provider  diphenhydrAMINE (BENADRYL) 25 MG tablet Take 25 mg by mouth every 6 (six) hours as needed for allergies.    Yes [provider]  benzonatate (TESSALON) 100 MG capsule Take 1 capsule (100 mg total) by mouth every 8 (eight) hours. 06/11/16   Renne CriglerGeiple, Joshua, PA-C  eletriptan (RELPAX) 40 MG tablet Take 40 mg by mouth as needed for migraine or headache. May repeat in 2 hours if headache persists or recurs.    [provider]  ibuprofen (ADVIL,MOTRIN) 200 MG tablet Take 800 mg by mouth every 6 (six) hours as needed for moderate pain.    [provider]  metoprolol succinate (TOPROL-XL) 50 MG 24 hr tablet Take 50 mg by mouth daily. Pt states "I do not take like I should" 08/18/14   [provider]  nortriptyline (PAMELOR) 10 MG capsule Take 2 capsules (20 mg total) by mouth at bedtime. 11/17/14   Anson FretAhern, Antonia B, MD  ondansetron (ZOFRAN ODT) 8 MG disintegrating tablet Take 1 tablet (8 mg total) by mouth every 8 (eight) hours as needed for nausea or vomiting. 09/24/14   Azalia Bilisampos, Kevin, MD  oxyCODONE-acetaminophen (PERCOCET/ROXICET) 5-325 MG per tablet Take 1-2 tablets by  mouth every 6 (six) hours as needed for severe pain. 03/29/15   Benjiman CorePickering, Nathan, MD  prochlorperazine (COMPAZINE) 10 MG tablet Take 1 tablet (10 mg total) by mouth 2 (two) times daily as needed for nausea or vomiting. 03/29/15   Benjiman CorePickering, Nathan, MD  SUMAtriptan-naproxen (TREXIMET) 85-500 MG per tablet Take 1 tablet by mouth every 2 (two) hours as needed for migraine. PRN    [provider]  valACYclovir (VALTREX) 500 MG tablet Take 500 mg by mouth 2 (two) times daily.    [provider]    Family History Family History  Problem Relation Age of Onset  . Cirrhosis Father   . Alcoholism Father   . Breast cancer Maternal Grandmother        great-grandmother  . Breast cancer Cousin   . Diabetes Paternal  Grandfather     Social History Social History   Tobacco Use  . Smoking status: Never Smoker  . Smokeless tobacco: Never Used  . Tobacco comment: NEVER USED TOBACCO  Substance Use Topics  . Alcohol use: Yes    Alcohol/week: 0.0 standard drinks    Comment: Very rare (1/month)  . Drug use: No     Allergies   Citric acid; Latex; and Penicillins cross reactors   Review of Systems Review of Systems  Constitutional: Negative.   HENT: Negative.   Eyes: Positive for photophobia.  Respiratory: Negative.   Cardiovascular: Negative.   Gastrointestinal: Positive for nausea. Negative for abdominal distention, abdominal pain, anal bleeding, blood in stool, constipation, diarrhea, rectal pain and vomiting.  Genitourinary: Negative.   Musculoskeletal: Negative.   Skin: Negative.   Neurological: Positive for headaches.  All other systems reviewed and are negative.    Physical Exam Updated Vital Signs BP 114/76 (BP Location: Left Arm)   Pulse 72   Temp 98.5 F (36.9 C) (Oral)   Resp 16   Ht 5\' 7"  (1.702 m)   Wt 83.9 kg   LMP 08/10/2018 (Exact Date)   SpO2 100%   BMI 28.98 kg/m   Physical Exam  Physical Exam  Constitutional: Pt is oriented to person, place, and time. Pt appears well-developed and well-nourished. No distress.  HENT:  Head: Normocephalic and atraumatic.  Mouth/Throat: Oropharynx is clear and moist.  Eyes: Conjunctivae and EOM are normal. Pupils are equal, round, and reactive to light. No scleral icterus.  No horizontal, vertical or rotational nystagmus  Neck: Normal range of motion. Neck supple.  Full active and passive ROM without pain No midline or paraspinal tenderness No nuchal rigidity or meningeal signs  Cardiovascular: Normal rate, regular rhythm and intact distal pulses.   Pulmonary/Chest: Effort normal and breath sounds normal. No respiratory distress. Pt has no wheezes. No rales.  Abdominal: Soft. Bowel sounds are normal. There is no tenderness.  There is no rebound and no guarding.  Musculoskeletal: Normal range of motion.  Lymphadenopathy:    No cervical adenopathy.  Neurological: Pt. is alert and oriented to person, place, and time. He has normal reflexes. No cranial nerve deficit.  Exhibits normal muscle tone. Coordination normal.  Mental Status:  Alert, oriented, thought content appropriate. Speech fluent without evidence of aphasia. Able to follow 2 step commands without difficulty.  Cranial Nerves:  II:  Peripheral visual fields grossly normal, pupils equal, round, reactive to light III,IV, VI: ptosis not present, extra-ocular motions intact bilaterally  V,VII: smile symmetric, facial light touch sensation equal VIII: hearing grossly normal bilaterally  IX,X: midline uvula rise  XI: bilateral shoulder shrug  equal and strong XII: midline tongue extension  Motor:  5/5 in upper and lower extremities bilaterally including strong and equal grip strength and dorsiflexion/plantar flexion Sensory: Pinprick and light touch normal in all extremities.  Deep Tendon Reflexes: 2+ and symmetric  Cerebellar: normal finger-to-nose with bilateral upper extremities Gait: normal gait and balance CV: distal pulses palpable throughout   Skin: Skin is warm and dry. No rash noted. Pt is not diaphoretic.  Psychiatric: Pt has a normal mood and affect. Behavior is normal. Judgment and thought content normal.  Nursing note and vitals reviewed. ED Treatments / Results  Labs (all labs ordered are listed, but only abnormal results are displayed) Labs Reviewed - No data to display  EKG None  Radiology No results found.  Procedures Procedures (including critical care time)  Medications Ordered in ED Medications  sodium chloride 0.9 % bolus 1,000 mL ( Intravenous Stopped 08/13/18 1716)  metoCLOPramide (REGLAN) injection 10 mg (10 mg Intravenous Given 08/13/18 1612)  diphenhydrAMINE (BENADRYL) injection 12.5 mg (12.5 mg Intravenous Given 08/13/18  1613)     Initial Impression / Assessment and Plan / ED Course  I have reviewed the triage vital signs and the nursing notes.  Pertinent labs & imaging results that were available during my care of the patient were reviewed by me and considered in my medical decision making (see chart for details).  38 year old female who appears otherwise well presents for evaluation of migraine headache.  Has history of migraine headaches.  Onset 2 days ago.  Has been taking Advil for migraine without relief of symptoms.  Pain 9/10.  Nonfocal neurologic exam without neurologic deficits.  Does have history of viral meningitis, however states "this feels like my typical headache."  No recent illnesses. Afebrile, nonseptic, non-ill-appearing.  No neck stiffness, neck rigidity or signs of meningismus.  Will give migraine cocktail and reevaluate.  States headache resolved. No current head pain. Requesting DC home. Presentation is like pts typical HA and non concerning for [facility], ICH, Meningitis, or temporal arteritis. Pt is afebrile with no focal neuro deficits, nuchal rigidity, or change in vision. Pt is to follow up with PCP to discuss prophylactic medication.  She is hemodynamic stable and appropriate for DC with this time.  I discussed strict return precautions.  Patient was understanding and is agreeable to follow-up.   Final Clinical Impressions(s) / ED Diagnoses   Final diagnoses:  Migraine without aura and without status migrainosus, not intractable    ED Discharge Orders    None       ,  A, PA-C 08/13/18 1840    Jacalyn Lefevre, MD 08/13/18 1845

## 2018-08-13 NOTE — ED Triage Notes (Signed)
Presents with Migraine since Thursday on the right side associated with nausea, photophobia and sound sensitivity. She took benadryl and Excedrin at 2:30 AM today last with no relief. This is a typical Migraine headache for her. She endorses getting them often.

## 2018-08-13 NOTE — Discharge Instructions (Addendum)
Evaluated today for headache.  Your symptoms resolved in the department.  Please up with PCP for reevaluation.

## 2018-08-13 NOTE — ED Notes (Signed)
Pt reports feeling much better and ready to go home. 

## 2018-08-16 DIAGNOSIS — Z0000 Encounter for general adult medical examination without abnormal findings: Secondary | ICD-10-CM | POA: Diagnosis not present

## 2018-08-23 DIAGNOSIS — G43919 Migraine, unspecified, intractable, without status migrainosus: Secondary | ICD-10-CM | POA: Diagnosis not present

## 2018-08-23 DIAGNOSIS — Z0000 Encounter for general adult medical examination without abnormal findings: Secondary | ICD-10-CM | POA: Diagnosis not present

## 2018-08-23 DIAGNOSIS — G030 Nonpyogenic meningitis: Secondary | ICD-10-CM | POA: Diagnosis not present

## 2018-08-23 DIAGNOSIS — M2550 Pain in unspecified joint: Secondary | ICD-10-CM | POA: Diagnosis not present

## 2018-08-27 DIAGNOSIS — M2550 Pain in unspecified joint: Secondary | ICD-10-CM | POA: Diagnosis not present

## 2018-08-27 DIAGNOSIS — D649 Anemia, unspecified: Secondary | ICD-10-CM | POA: Diagnosis not present

## 2018-08-27 DIAGNOSIS — G43919 Migraine, unspecified, intractable, without status migrainosus: Secondary | ICD-10-CM | POA: Diagnosis not present

## 2018-09-20 DIAGNOSIS — J0100 Acute maxillary sinusitis, unspecified: Secondary | ICD-10-CM | POA: Diagnosis not present

## 2018-09-20 DIAGNOSIS — J3089 Other allergic rhinitis: Secondary | ICD-10-CM | POA: Diagnosis not present

## 2018-09-20 DIAGNOSIS — G43919 Migraine, unspecified, intractable, without status migrainosus: Secondary | ICD-10-CM | POA: Diagnosis not present

## 2018-09-20 DIAGNOSIS — Z77120 Contact with and (suspected) exposure to mold (toxic): Secondary | ICD-10-CM | POA: Diagnosis not present

## 2018-11-19 ENCOUNTER — Encounter: Payer: Self-pay | Admitting: Allergy

## 2018-11-19 ENCOUNTER — Ambulatory Visit (INDEPENDENT_AMBULATORY_CARE_PROVIDER_SITE_OTHER): Payer: BLUE CROSS/BLUE SHIELD | Admitting: Allergy

## 2018-11-19 ENCOUNTER — Other Ambulatory Visit: Payer: Self-pay

## 2018-11-19 VITALS — BP 110/82 | HR 87 | Temp 97.8°F | Resp 18 | Ht 67.0 in | Wt 185.0 lb

## 2018-11-19 DIAGNOSIS — J3089 Other allergic rhinitis: Secondary | ICD-10-CM

## 2018-11-19 DIAGNOSIS — H1010 Acute atopic conjunctivitis, unspecified eye: Secondary | ICD-10-CM

## 2018-11-19 DIAGNOSIS — Z880 Allergy status to penicillin: Secondary | ICD-10-CM | POA: Diagnosis not present

## 2018-11-19 DIAGNOSIS — R21 Rash and other nonspecific skin eruption: Secondary | ICD-10-CM | POA: Diagnosis not present

## 2018-11-19 MED ORDER — FLUTICASONE PROPIONATE 50 MCG/ACT NA SUSP: NASAL | 5 refills | Status: DC

## 2018-11-19 MED ORDER — OLOPATADINE HCL 0.7 % OP SOLN: 1.0000 [drp] | OPHTHALMIC | 5 refills | Status: DC

## 2018-11-19 MED ORDER — AZELASTINE HCL 0.1 % NA SOLN: NASAL | 5 refills | Status: DC

## 2018-11-19 NOTE — Assessment & Plan Note (Signed)
Increased rhino conjunctivitis symptoms for the past 5 years but worse the last 2 years. Patient concerned about mold allergies as there was mold exposure in the last 2 apartments she lived in. No recent ENT evaluation. Tried Flonase, Claritin, zyrtec, Xyzal with no benefit. Only benadryl seems to help.   Unable to skin test today due to recent antihistamine intake.  Get bloodwork and will make additional recommendations based on results.    May take allegra 180mg  1-2 times a day as long it does not cause drowsiness. Sample given.   Start Flonase 1-2 sprays daily.   Nasal saline spray (i.e., Simply Saline) or nasal saline lavage (i.e., NeilMed) is recommended as needed and prior to medicated nasal sprays.  May use azelastine nasal spray 1-2 sprays twice a day as needed for runny nose.  May use Pazeo 1 drop in each eye as needed daily for itchy/watery eyes. Do not use over contacts. Wait about 10-15 minutes before placing contacts in.

## 2018-11-19 NOTE — Assessment & Plan Note (Addendum)
No pictures of the rash available for review and currently having minimal issues.  Patient was seen by dermatology for this in the past.  Discussed with patient that based on clinical history not sure what this rash was caused by.  Take pictures when this reappears.  Discussed proper skin care measures.

## 2018-11-19 NOTE — Progress Notes (Signed)
New Patient Note  RE: [name] MRN: [id number] DOB: 07/25/1981 Date of Office Visit: 11/19/2018  Referring provider: Georgianne Fick, MD Primary care provider: Georgianne Fick, MD  Chief Complaint: Allergic Rhinitis   History of Present Illness: I had the pleasure of seeing [name] for initial evaluation at the [facility] on 11/19/2018. She is a 38 y.o. female, who is referred here by Georgianne Fick, MD for the evaluation of allergic rhinitis.   Rhinitis: Patient was exposed to mold at the last 2 apartments she lived in for the past 7 years. This was apparently remediated.  She noticed worsening allergies - sneezing, itchy eyes, headaches, sinus infections - increased mucous productions, PND, rash on the legs/arms for the past 1 year. Describes the rash as raised bumps but not pruritic.  No reflux symptoms. She was seen by dermatology for the rash and possibly diagnosed with folliculitis. She tried some type of antibiotic pill and cream with no benefit. The rash on the arms are gone but has some leftover marks on the legs.   Symptoms have been going on for 5 years but worse the past 2 years. The symptoms are present all year around with worsening in spring. Other triggers include exposure to ? mold. Anosmia: no. Headache: yes. She has used benadryl with good benefit. She tried Flonase, Claritin, zyrtec, xyzal with no improvement in symptoms. Sinus infections: yes 5+ in the past year. Previous work up includes: none. Previous ENT evaluation: not recently.  Assessment and Plan: [name] is a 38 y.o. female with: Other allergic rhinitis Increased rhino conjunctivitis symptoms for the past 5 years but worse the last 2 years. Patient concerned about mold allergies as there was mold exposure in the last 2 apartments she lived in. No recent ENT evaluation. Tried Flonase, Claritin, zyrtec, Xyzal with no benefit. Only benadryl seems to help.   Unable  to skin test today due to recent antihistamine intake.  Get bloodwork and will make additional recommendations based on results.    May take allegra  1-2 times a day as long it does not cause drowsiness. Sample given.   Start Flonase 1-2 sprays daily.   Nasal saline spray (i.e., Simply Saline) or nasal saline lavage (i.e., NeilMed) is recommended as needed and prior to medicated nasal sprays.  May use azelastine nasal spray 1-2 sprays twice a day as needed for runny nose.  May use Pazeo 1 drop in each eye as needed daily for itchy/watery eyes. Do not use over contacts. Wait about 10-15 minutes before placing contacts in.  Seasonal allergic conjunctivitis See assessment and plan as above.  Rash and other nonspecific skin eruption No pictures of the rash available for review and currently having minimal issues.  Patient was seen by dermatology for this in the past.  Discussed with patient that based on clinical history not sure what this rash was caused by.  Take pictures when this reappears.  Discussed proper skin care measures.  Penicillin allergy History of reaction to penicillin as a child in the form of rash/hives.  Continue avoidance for now and consider allergy testing in future.  Return in about 2 months (around 01/19/2019).  Meds ordered this encounter  Medications  . fluticasone (FLONASE) 50 MCG/ACT nasal spray    Sig: 1-2 spray daily.    Dispense:  16 g    Refill:  5  . azelastine (ASTELIN) 0.1 % nasal spray    Sig: Use 1-2 sprays twice a  day as needed for runny nose.    Dispense:  30 mL    Refill:  5  . Olopatadine HCl (PAZEO) 0.7 % SOLN    Sig: Place 1 drop into both eyes 1 day or 1 dose. Daily as needed for itchy/watery eyes.    Dispense:  1 Bottle    Refill:  5    Lab Orders     Allergen Profile, Mold     Allergens w/Total IgE Area 2 Other allergy screening: Asthma: no Rhino conjunctivitis: yes Food allergy: no Medication allergy: yes   Penicillin - rash/hives Latex - ? rash  Hymenoptera allergy: no Urticaria: no Eczema:no History of recurrent infections suggestive of immunodeficency: no  Diagnostics: Skin Testing: Deferred due to recent antihistamines use.  Past Medical History: Patient Active Problem List   Diagnosis Date Noted  . Other allergic rhinitis 11/19/2018  . Seasonal allergic conjunctivitis 11/19/2018  . Rash and other nonspecific skin eruption 11/19/2018  . 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 Iron deficiency anemia 11/05/2014  . Menometrorrhagia 11/05/2014  . Migraine with status migrainosus 10/14/2014  . 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 Meningitis 09/28/2013   Past Medical History:  Diagnosis Date  . Anemia   . Iron deficiency anemia 11/05/2014  . Menometrorrhagia 11/05/2014  . Migraine   . Viral meningitis    Past Surgical History: Past Surgical History:  Procedure Laterality Date  . CESAREAN SECTION N/A 06/15/2013   Procedure: CESAREAN SECTION;  Surgeon: Philip Aspen, DO;  Location: [facility];  Service: Obstetrics;  Laterality: N/A;  . FETAL BLOOD TRANSFUSION  2014   With C-section   Medication List:  Current Outpatient Medications  Medication Sig Dispense Refill  . diphenhydrAMINE (BENADRYL) 25 MG tablet Take 25 mg by mouth every 6 (six) hours as needed for allergies.     Marland Kitchen ibuprofen (ADVIL,MOTRIN) 200 MG tablet Take 800 mg by mouth every 6 (six) hours as needed for moderate pain.    . nortriptyline (PAMELOR) 10 MG capsule Take 2 capsules (20 mg total) by mouth at bedtime. 60 capsule 6  . valACYclovir (VALTREX) 500 MG tablet Take 500 mg by mouth 2 (two) times daily.    Marland Kitchen azelastine (ASTELIN) 0.1 % nasal spray Use 1-2 sprays twice a day as needed for runny nose. 30 mL 5  . fluticasone (FLONASE) 50 MCG/ACT nasal spray 1-2 spray daily. 16 g 5  . Olopatadine HCl (PAZEO) 0.7 % SOLN Place 1 drop into both eyes 1 day or 1 dose. Daily as needed for itchy/watery eyes. 1 Bottle 5   No current  facility-administered medications for this visit.    Allergies: Allergies  Allergen Reactions  . Citric Acid     Burns inside of mouth  . Latex Rash  . Penicillins Cross Reactors Hives and Rash   Social History: Social History   Socioeconomic History  . Marital status: Married    Spouse name: Darren  . Number of children: 2  . Years of education: 64  . Highest education level: Not on file  Occupational History  . Occupation: Doctor, hospital  Social Needs  . Financial resource strain: Not on file  . Food insecurity:    Worry: Not on file    Inability: Not on file  . Transportation needs:    Medical: Not on file    Non-medical: Not on file  Tobacco Use  . Smoking status: Never Smoker  . Smokeless tobacco: Never Used  . Tobacco comment: NEVER USED TOBACCO  Substance and Sexual  Activity  . Alcohol use: Yes    Alcohol/week: 0.0 standard drinks    Comment: Very rare (1/month)  . Drug use: No  . Sexual activity: Yes    Birth control/protection: None  Lifestyle  . Physical activity:    Days per week: Not on file    Minutes per session: Not on file  . Stress: Not on file  Relationships  . Social connections:    Talks on phone: Not on file    Gets together: Not on file    Attends religious service: Not on file    Active member of club or organization: Not on file    Attends meetings of clubs or organizations: Not on file    Relationship status: Not on file  Other Topics Concern  . Not on file  Social History Narrative   Lives at home husband and 2 kids.   Right handed.   Caffeine use: 1/2 liter/day and coffee, tea every day   Lives in a 36 year old apartment. Smoking: denies Occupation: Audiological scientist HistorySurveyor, minerals in the house: yes Carpet in the family room: yes Carpet in the bedroom: yes Heating: electric Cooling: central Pet: no  Family History: Family History  Problem Relation Age of Onset  .  Allergic rhinitis Mother   . Cirrhosis Father   . Alcoholism Father   . Breast cancer Maternal Grandmother        great-grandmother  . Breast cancer Cousin   . Diabetes Paternal Grandfather   . Allergic rhinitis Maternal Aunt    Review of Systems  Constitutional: Negative for appetite change, chills, fever and unexpected weight change.  HENT: Positive for congestion, postnasal drip, rhinorrhea and sneezing.   Eyes: Positive for itching.  Respiratory: Negative for cough, chest tightness, shortness of breath and wheezing.   Cardiovascular: Negative for chest pain.  Gastrointestinal: Negative for abdominal pain.  Genitourinary: Negative for difficulty urinating.  Skin: Positive for rash.  Neurological: Positive for headaches.   Objective: BP 110/82 (BP Location: Left Arm, Patient Position: Sitting, Cuff Size: Large)   Pulse 87   Temp 97.8 F (36.6 C) (Tympanic)   Resp 18   Ht  (1.702 m)   Wt 185 lb (83.9 kg)   BMI 28.98 kg/m  Body mass index is 28.98 kg/m. Physical Exam  Constitutional: She is oriented to person, place, and time. She appears well-developed and well-nourished.  HENT:  Head: Normocephalic and atraumatic.  Right Ear: External ear normal.  Left Ear: External ear normal.  Nose: Nose normal.  Mouth/Throat: Oropharynx is clear and moist.  Eyes: Conjunctivae and EOM are normal.  Neck: Neck supple.  Cardiovascular: Normal rate, regular rhythm and normal heart sounds. Exam reveals no gallop and no friction rub.  No murmur heard. Pulmonary/Chest: Effort normal and breath sounds normal. She has no wheezes. She has no rales.  Abdominal: Soft.  Neurological: She is alert and oriented to person, place, and time.  Skin: Skin is warm. No rash noted.  No rash on upper extremities. On left anterior shin area there is a small patch of hyperpigmentation.   Psychiatric: She has a normal mood and affect. Her behavior is normal.  Nursing note and vitals reviewed.  The  plan was reviewed with the patient/family, and all questions/concerned were addressed.  It was my pleasure to see [name] today and participate in her care. Please feel free to contact me with any questions or concerns.  Sincerely,  Wyline Mood,  DO Allergy & Immunology  [facility]  office: [phone] [facility] office: [phone]

## 2018-11-19 NOTE — Assessment & Plan Note (Signed)
History of reaction to penicillin as a child in the form of rash/hives.  Continue avoidance for now and consider allergy testing in future.

## 2018-11-19 NOTE — Patient Instructions (Addendum)
Get bloodwork and will make additional recommendations based on results.   May take allegra 180mg  1-2 times a day as long it does not cause drowsiness.  Start Flonase 1-2 sprays daily.   Nasal saline spray (i.e., Simply Saline) or nasal saline lavage (i.e., NeilMed) is recommended as needed and prior to medicated nasal sprays. May use azelastine nasal spray 1-2 sprays twice a day as needed for runny nose. May use Pazeo 1 drop in each eye as needed daily for itchy/watery eyes. Do not use over contacts. Wait about 10-15 minutes before placing contacts in.  See below for the skin care recommendations.   Follow up in 2 months   Skin care recommendations  Bath time: . Always use lukewarm water. AVOID very hot or cold water. Marland Kitchen Keep bathing time to 5-10 minutes. . Do NOT use bubble bath. . Use a mild soap and use just enough to wash the dirty areas. . Do NOT scrub skin vigorously.  . After bathing, pat dry your skin with a towel. Do NOT rub or scrub the skin.  Moisturizers and prescriptions:  . ALWAYS apply moisturizers immediately after bathing (within 3 minutes). This helps to lock-in moisture. . Use the moisturizer several times a day over the whole body. Peri [place] summer moisturizers include: Aveeno, CeraVe, Cetaphil. Peri [place] winter moisturizers include: Aquaphor, Vaseline, Cerave, Cetaphil, Eucerin, Vanicream. . When using moisturizers along with medications, the moisturizer should be applied about one hour after applying the medication to prevent diluting effect of the medication or moisturize around where you applied the medications. When not using medications, the moisturizer can be continued twice daily as maintenance.  Laundry and clothing: . Avoid laundry products with added color or perfumes. . Use unscented hypo-allergenic laundry products such as Tide free, Cheer free & gentle, and All free and clear.  . If the skin still seems dry or sensitive, you can try double-rinsing the  clothes. . Avoid tight or scratchy clothing such as wool. . Do not use fabric softeners or dyer sheets.

## 2018-11-19 NOTE — Assessment & Plan Note (Signed)
.   See assessment and plan as above. 

## 2018-11-21 LAB — ALLERGEN PROFILE, MOLD
Aureobasidi Pullulans IgE: <0.1 kU/L
Candida Albicans IgE: <0.1 kU/L
M009-IgE Fusarium proliferatum: <0.1 kU/L
M014-IgE Epicoccum purpur: <0.1 kU/L
Mucor Racemosus IgE: 0.19 kU/L — AB
Phoma Betae IgE: <0.1 kU/L
Setomelanomma Rostrat: <0.1 kU/L
Stemphylium Herbarum IgE: <0.1 kU/L

## 2018-11-21 LAB — ALLERGENS W/TOTAL IGE AREA 2
Alternaria Alternata IgE: <0.1 kU/L
Aspergillus Fumigatus IgE: <0.1 kU/L
Bermuda Grass IgE: 29.1 kU/L — AB
Cat Dander IgE: <0.1 kU/L
Cedar, Mountain IgE: 0.26 kU/L — AB
Cladosporium Herbarum IgE: <0.1 kU/L
Cockroach, German IgE: 0.12 kU/L — AB
Common Silver Birch IgE: 0.33 kU/L — AB
Cottonwood IgE: 0.36 kU/L — AB
D Farinae IgE: <0.1 kU/L
D Pteronyssinus IgE: <0.1 kU/L
Dog Dander IgE: 0.12 kU/L — AB
Elm, American IgE: 0.51 kU/L — AB
IgE (Immunoglobulin E), Serum: 94 IU/mL (ref 6–495)
Johnson Grass IgE: 24.3 kU/L — AB
Maple/Box Elder IgE: 0.51 kU/L — AB
Mouse Urine IgE: <0.1 kU/L
Oak, White IgE: 0.55 kU/L — AB
Pecan, Hickory IgE: 0.39 kU/L — AB
Penicillium Chrysogen IgE: <0.1 kU/L
Pigweed, Rough IgE: 0.43 kU/L — AB
Ragweed, Short IgE: 2.99 kU/L — AB
Sheep Sorrel IgE Qn: 0.59 kU/L — AB
Timothy Grass IgE: 32.4 kU/L — AB
White Mulberry IgE: 0.43 kU/L — AB

## 2018-11-23 MED ORDER — FEXOFENADINE HCL 180 MG PO TABS: 180.0000 mg | ORAL_TABLET | Freq: Two times a day (BID) | ORAL | 5 refills | Status: AC

## 2018-11-23 NOTE — Addendum Note (Signed)
Addended by: Teressa Senter on: 11/23/2018 03:47 PM   Modules accepted: Orders

## 2018-11-27 ENCOUNTER — Telehealth: Payer: Self-pay | Admitting: Neurology

## 2018-11-27 NOTE — Telephone Encounter (Signed)
Noted thank you

## 2018-11-27 NOTE — Telephone Encounter (Signed)
I called the patient several times and left messages with no response. I called today for the 4th time and I left the patient a message making her aware that her apt for tomorrow 04/22 was being cancelled until we hear back from her (ok per DPR). DW

## 2018-11-28 ENCOUNTER — Institutional Professional Consult (permissible substitution): Payer: BLUE CROSS/BLUE SHIELD | Admitting: Neurology

## 2019-04-04 DIAGNOSIS — Z0000 Encounter for general adult medical examination without abnormal findings: Secondary | ICD-10-CM | POA: Diagnosis not present

## 2019-04-04 DIAGNOSIS — Z113 Encounter for screening for infections with a predominantly sexual mode of transmission: Secondary | ICD-10-CM | POA: Diagnosis not present

## 2019-04-04 DIAGNOSIS — Z01419 Encounter for gynecological examination (general) (routine) without abnormal findings: Secondary | ICD-10-CM | POA: Diagnosis not present

## 2019-04-04 DIAGNOSIS — Z6828 Body mass index (BMI) 28.0-28.9, adult: Secondary | ICD-10-CM | POA: Diagnosis not present

## 2019-06-17 ENCOUNTER — Encounter (HOSPITAL_COMMUNITY): Payer: Self-pay | Admitting: Emergency Medicine

## 2019-06-17 ENCOUNTER — Emergency Department (HOSPITAL_COMMUNITY): Payer: BC Managed Care – PPO

## 2019-06-17 ENCOUNTER — Other Ambulatory Visit: Payer: Self-pay

## 2019-06-17 DIAGNOSIS — G032 Benign recurrent meningitis [Mollaret]: Secondary | ICD-10-CM | POA: Diagnosis not present

## 2019-06-17 DIAGNOSIS — R519 Headache, unspecified: Secondary | ICD-10-CM | POA: Diagnosis not present

## 2019-06-17 LAB — URINALYSIS, ROUTINE W REFLEX MICROSCOPIC
Bacteria, UA: NONE SEEN
Bilirubin Urine: NEGATIVE
Glucose, UA: NEGATIVE mg/dL
Ketones, ur: NEGATIVE mg/dL
Leukocytes,Ua: NEGATIVE
Nitrite: NEGATIVE
Protein, ur: NEGATIVE mg/dL
Specific Gravity, Urine: 1.017 (ref 1.005–1.030)
pH: 5 (ref 5.0–8.0)

## 2019-06-17 LAB — COMPREHENSIVE METABOLIC PANEL
ALT: 16 U/L (ref 0–44)
AST: 22 U/L (ref 15–41)
Albumin: 3.8 g/dL (ref 3.5–5.0)
Alkaline Phosphatase: 59 U/L (ref 38–126)
Anion gap: 5 (ref 5–15)
BUN: 9 mg/dL (ref 6–20)
CO2: 25 mmol/L (ref 22–32)
Calcium: 8.6 mg/dL — ABNORMAL LOW (ref 8.9–10.3)
Chloride: 109 mmol/L (ref 98–111)
Creatinine, Ser: 0.72 mg/dL (ref 0.44–1.00)
GFR calc Af Amer: >60 mL/min (ref >60)
GFR calc non Af Amer: >60 mL/min (ref >60)
Glucose, Bld: 95 mg/dL (ref 70–99)
Potassium: 3.3 mmol/L — ABNORMAL LOW (ref 3.5–5.1)
Sodium: 139 mmol/L (ref 135–145)
Total Bilirubin: 0.6 mg/dL (ref 0.3–1.2)
Total Protein: 7.4 g/dL (ref 6.5–8.1)

## 2019-06-17 LAB — CBC WITH DIFFERENTIAL/PLATELET
Abs Immature Granulocytes: 0.01 10*3/uL (ref 0.00–0.07)
Basophils Absolute: 0 10*3/uL (ref 0.0–0.1)
Basophils Relative: 1 %
Eosinophils Absolute: 0.1 10*3/uL (ref 0.0–0.5)
Eosinophils Relative: 3 %
HCT: 38.3 % (ref 36.0–46.0)
Hemoglobin: 11.8 g/dL — ABNORMAL LOW (ref 12.0–15.0)
Immature Granulocytes: 0 %
Lymphocytes Relative: 46 %
Lymphs Abs: 1.9 10*3/uL (ref 0.7–4.0)
MCH: 30.3 pg (ref 26.0–34.0)
MCHC: 30.8 g/dL (ref 30.0–36.0)
MCV: 98.2 fL (ref 80.0–100.0)
Monocytes Absolute: 0.6 10*3/uL (ref 0.1–1.0)
Monocytes Relative: 14 %
Neutro Abs: 1.5 10*3/uL — ABNORMAL LOW (ref 1.7–7.7)
Neutrophils Relative %: 36 %
Platelets: 226 10*3/uL (ref 150–400)
RBC: 3.9 MIL/uL (ref 3.87–5.11)
RDW: 14.1 % (ref 11.5–15.5)
WBC: 4.1 10*3/uL (ref 4.0–10.5)
nRBC: 0 % (ref 0.0–0.2)

## 2019-06-17 LAB — I-STAT BETA HCG BLOOD, ED (MC, WL, AP ONLY): I-stat hCG, quantitative: <5 m[IU]/mL (ref <5)

## 2019-06-17 LAB — LACTIC ACID, PLASMA: Lactic Acid, Venous: 0.8 mmol/L (ref 0.5–1.9)

## 2019-06-17 MED ORDER — SODIUM CHLORIDE 0.9% FLUSH
3.0000 mL | Freq: Once | INTRAVENOUS | Status: AC
  Administered 2019-06-18: 3 mL via INTRAVENOUS

## 2019-06-17 NOTE — ED Triage Notes (Signed)
Patient here from home with complaints of headache and neck pain that started last night. 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

## 2019-06-17 NOTE — ED Notes (Signed)
PT UP TO WINDOW AND UPDATED ON BED STATUS. MADE AWARE OF PLAN OF CARE. PT VERBALIZED UNDERSTANDING

## 2019-06-18 ENCOUNTER — Emergency Department (HOSPITAL_COMMUNITY)
Admission: EM | Admit: 2019-06-18 | Discharge: 2019-06-18 | Disposition: A | Discharge status: Home / Self Care | Payer: BC Managed Care – PPO | Attending: Emergency Medicine | Admitting: Emergency Medicine

## 2019-06-18 DIAGNOSIS — G032 Benign recurrent meningitis [Mollaret]: Secondary | ICD-10-CM

## 2019-06-18 MED ORDER — VALACYCLOVIR HCL 500 MG PO TABS: 1000.0000 mg | ORAL_TABLET | Freq: Three times a day (TID) | ORAL | 0 refills | Status: AC

## 2019-06-18 MED ORDER — FENTANYL CITRATE (PF) 100 MCG/2ML IJ SOLN
50.0000 ug | Freq: Once | INTRAMUSCULAR | Status: AC
  Administered 2019-06-18: 50 ug via INTRAVENOUS
  Filled 2019-06-18: qty 2

## 2019-06-18 MED ORDER — METOCLOPRAMIDE HCL 5 MG/ML IJ SOLN
10.0000 mg | Freq: Once | INTRAMUSCULAR | Status: AC
  Administered 2019-06-18: 10 mg via INTRAVENOUS
  Filled 2019-06-18: qty 2

## 2019-06-18 MED ORDER — VALACYCLOVIR HCL 500 MG PO TABS
1000.0000 mg | ORAL_TABLET | Freq: Once | ORAL | Status: AC
  Administered 2019-06-18: 1000 mg via ORAL
  Filled 2019-06-18: qty 2

## 2019-06-18 MED ORDER — DIPHENHYDRAMINE HCL 50 MG/ML IJ SOLN
12.5000 mg | Freq: Once | INTRAMUSCULAR | Status: AC
  Administered 2019-06-18: 12.5 mg via INTRAVENOUS
  Filled 2019-06-18: qty 1

## 2019-06-18 MED ORDER — KETOROLAC TROMETHAMINE 30 MG/ML IJ SOLN
30.0000 mg | Freq: Once | INTRAMUSCULAR | Status: AC
  Administered 2019-06-18: 30 mg via INTRAVENOUS
  Filled 2019-06-18: qty 1

## 2019-06-18 NOTE — ED Provider Notes (Signed)
[facility]-EMERGENCY DEPT Provider Note   CSN: [id number] Arrival date & time: 06/17/19  1600     History   Chief Complaint Chief Complaint  Patient presents with  . Headache  . Neck Pain    HPI [name] is a 38 y.o. female.     Patient to ED with complaint of headache and neck pain x 2 days. No fever, nausea or vomiting. 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 She is taking Valtrex 500 mg BID and reports she is compliant but that the generic formulation has been changed at her drug store and she was wondering if this was not contributing to symptoms. No cough, congestion, sore throat, fever.  The history is provided by the patient. No language interpreter was used.  Headache Associated symptoms: back pain, neck pain and photophobia   Associated symptoms: no fever and no nausea   Neck Pain Associated symptoms: headaches and photophobia   Associated symptoms: no fever     Past Medical History:  Diagnosis Date  . Anemia   . Iron deficiency anemia 11/05/2014  . Menometrorrhagia 11/05/2014  . Migraine   . Viral meningitis     Patient Active Problem List   Diagnosis Date Noted  . Other allergic rhinitis 11/19/2018  . Seasonal allergic conjunctivitis 11/19/2018  . Rash and other nonspecific skin eruption 11/19/2018  . 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 Iron deficiency anemia 11/05/2014  . Menometrorrhagia 11/05/2014  . Migraine with status migrainosus 10/14/2014  . 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 Meningitis 09/28/2013    Past Surgical History:  Procedure Laterality Date  . CESAREAN SECTION N/A 06/15/2013   Procedure: CESAREAN SECTION;  Surgeon: Philip AspenSidney Callahan, DO;  Location: [facility];  Service: Obstetrics;  Laterality: N/A;  . FETAL BLOOD TRANSFUSION  2014   With C-section     OB History    Gravida  2   Para  2   Term  2   Preterm      AB      Living  2     SAB      TAB      Ectopic      Multiple      Live Births  2            Home Medications    Prior to Admission medications   Medication Sig Start Date End Date Taking? Authorizing Provider  azelastine (ASTELIN) 0.1 % nasal spray Use 1-2 sprays twice a day as needed for runny nose. 11/19/18   Ellamae SiaKim, Yoon M, DO  diphenhydrAMINE (BENADRYL) 25 MG tablet Take 25 mg by mouth every 6 (six) hours as needed for allergies.     [provider]  fexofenadine (ALLEGRA ALLERGY) 180 MG tablet Take 1 tablet (180 mg total) by mouth 2 (two) times a day. 11/23/18   Ellamae SiaKim, Yoon M, DO  fluticasone (FLONASE) 50 MCG/ACT nasal spray 1-2 spray daily. 11/19/18   Ellamae SiaKim, Yoon M, DO  ibuprofen (ADVIL,MOTRIN) 200 MG tablet Take 800 mg by mouth every 6 (six) hours as needed for moderate pain.    [provider]  nortriptyline (PAMELOR) 10 MG capsule Take 2 capsules (20 mg total) by mouth at bedtime. 11/17/14   Anson FretAhern, Antonia B, MD  Olopatadine HCl (PAZEO) 0.7 % SOLN Place 1 drop into both eyes 1 day or 1 dose. Daily as needed for itchy/watery eyes. 11/19/18   Ellamae SiaKim, Yoon M, DO  valACYclovir (VALTREX) 500 MG  tablet Take 500 mg by mouth 2 (two) times daily.    [provider]    Family History Family History  Problem Relation Age of Onset  . Allergic rhinitis Mother   . Cirrhosis Father   . Alcoholism Father   . Breast cancer Maternal Grandmother        great-grandmother  . Breast cancer Cousin   . Diabetes Paternal Grandfather   . Allergic rhinitis Maternal Aunt     Social History Social History   Tobacco Use  . Smoking status: Never Smoker  . Smokeless tobacco: Never Used  . Tobacco comment: NEVER USED TOBACCO  Substance Use Topics  . Alcohol use: Yes    Alcohol/week: 0.0 standard drinks    Comment: Very rare (1/month)  . Drug use: No     Allergies   Citric acid, Latex, and Penicillins cross reactors   Review of Systems Review of Systems  Constitutional: Negative for chills and fever.  HENT: Negative.    Eyes: Positive for photophobia. Negative for visual disturbance.  Respiratory: Negative.   Cardiovascular: Negative.   Gastrointestinal: Negative.  Negative for nausea.  Musculoskeletal: Positive for back pain and neck pain.  Skin: Negative.   Neurological: Positive for headaches.     Physical Exam Updated Vital Signs BP 126/84   Pulse 70   Temp 98.6 F (37 C) (Oral)   Resp 14   LMP 06/15/2019 (Approximate)   SpO2 99%   Physical Exam Vitals signs and nursing note reviewed.  Constitutional:      Appearance: She is well-developed.  HENT:     Head: Normocephalic.  Neck:     Musculoskeletal: Normal range of motion and neck supple.  Cardiovascular:     Rate and Rhythm: Normal rate and regular rhythm.  Pulmonary:     Effort: Pulmonary effort is normal.     Breath sounds: Normal breath sounds.  Abdominal:     General: Bowel sounds are normal.     Palpations: Abdomen is soft.     Tenderness: There is no abdominal tenderness. There is no guarding or rebound.  Musculoskeletal: Normal range of motion.     Comments: Midline cervical and thoracic tenderness without swelling.   Skin:    General: Skin is warm and dry.  Neurological:     Mental Status: She is alert.     GCS: GCS eye subscore is 4. GCS verbal subscore is 5. GCS motor subscore is 6.     Sensory: No sensory deficit.     Coordination: Coordination normal.     Comments: CN's 3-12 grossly intact. Speech is clear and focused. No facial asymmetry. No lateralizing weakness. Reflexes are equal. No deficits of coordination. Ambulatory without imbalance.        ED Treatments / Results  Labs (all labs ordered are listed, but only abnormal results are displayed) Labs Reviewed  COMPREHENSIVE METABOLIC PANEL - Abnormal; Notable for the following components:      Result Value   Potassium 3.3 (*)    Calcium 8.6 (*)    All other components within normal limits  CBC WITH DIFFERENTIAL/PLATELET - Abnormal; Notable for the  following components:   Hemoglobin 11.8 (*)    Neutro Abs 1.5 (*)    All other components within normal limits  URINALYSIS, ROUTINE W REFLEX MICROSCOPIC - Abnormal; Notable for the following components:   Hgb urine dipstick MODERATE (*)    All other components within normal limits  LACTIC ACID, PLASMA  LACTIC ACID, PLASMA  I-STAT BETA HCG BLOOD, ED ([facility], [facility], [facility] ONLY)   Results for orders placed or performed during the hospital encounter of 06/18/19  Lactic acid, plasma  Result Value Ref Range   Lactic Acid, Venous 0.8 0.5 - 1.9 mmol/L  Comprehensive metabolic panel  Result Value Ref Range   Sodium 139 135 - 145 mmol/L   Potassium 3.3 (L) 3.5 - 5.1 mmol/L   Chloride 109 98 - 111 mmol/L   CO2 25 22 - 32 mmol/L   Glucose, Bld 95 70 - 99 mg/dL   BUN 9 6 - 20 mg/dL   Creatinine, Ser 0.72 0.44 - 1.00 mg/dL   Calcium 8.6 (L) 8.9 - 10.3 mg/dL   Total Protein 7.4 6.5 - 8.1 g/dL   Albumin 3.8 3.5 - 5.0 g/dL   AST 22 15 - 41 U/L   ALT 16 0 - 44 U/L   Alkaline Phosphatase 59 38 - 126 U/L   Total Bilirubin 0.6 0.3 - 1.2 mg/dL   GFR calc non Af Amer >60 >60 mL/min   GFR calc Af Amer >60 >60 mL/min   Anion gap 5 5 - 15  CBC with Differential  Result Value Ref Range   WBC 4.1 4.0 - 10.5 K/uL   RBC 3.90 3.87 - 5.11 MIL/uL   Hemoglobin 11.8 (L) 12.0 - 15.0 g/dL   HCT 38.3 36.0 - 46.0 %   MCV 98.2 80.0 - 100.0 fL   MCH 30.3 26.0 - 34.0 pg   MCHC 30.8 30.0 - 36.0 g/dL   RDW 14.1 11.5 - 15.5 %   Platelets 226 150 - 400 K/uL   nRBC 0.0 0.0 - 0.2 %   Neutrophils Relative % 36 %   Neutro Abs 1.5 (L) 1.7 - 7.7 K/uL   Lymphocytes Relative 46 %   Lymphs Abs 1.9 0.7 - 4.0 K/uL   Monocytes Relative 14 %   Monocytes Absolute 0.6 0.1 - 1.0 K/uL   Eosinophils Relative 3 %   Eosinophils Absolute 0.1 0.0 - 0.5 K/uL   Basophils Relative 1 %   Basophils Absolute 0.0 0.0 - 0.1 K/uL   WBC Morphology MORPHOLOGY UNREMARKABLE    RBC Morphology MORPHOLOGY UNREMARKABLE    Immature Granulocytes 0 %    Abs Immature Granulocytes 0.01 0.00 - 0.07 K/uL  Urinalysis, Routine w reflex microscopic  Result Value Ref Range   Color, Urine YELLOW YELLOW   APPearance CLEAR CLEAR   Specific Gravity, Urine 1.017 1.005 - 1.030   pH 5.0 5.0 - 8.0   Glucose, UA NEGATIVE NEGATIVE mg/dL   Hgb urine dipstick MODERATE (A) NEGATIVE   Bilirubin Urine NEGATIVE NEGATIVE   Ketones, ur NEGATIVE NEGATIVE mg/dL   Protein, ur NEGATIVE NEGATIVE mg/dL   Nitrite NEGATIVE NEGATIVE   Leukocytes,Ua NEGATIVE NEGATIVE   RBC / HPF 0-5 0 - 5 RBC/hpf   WBC, UA 0-5 0 - 5 WBC/hpf   Bacteria, UA NONE SEEN NONE SEEN   Squamous Epithelial / LPF 0-5 0 - 5   Mucus PRESENT    Hyaline Casts, UA PRESENT   I-Stat beta hCG blood, ED  Result Value Ref Range   I-stat hCG, quantitative <5.0 <5 mIU/mL   Comment 3             EKG None  Radiology Dg Chest 2 View  Result Date: 06/17/2019 CLINICAL DATA:  Headache and neck pain. EXAM: CHEST - 2 VIEW COMPARISON:  09/24/2014 FINDINGS: The heart size and mediastinal contours are within normal limits.  Both lungs are clear. The visualized skeletal structures are unremarkable. IMPRESSION: Normal examination. Electronically Signed   By: Beckie Salts M.D.   On: 06/17/2019 17:33    Procedures Procedures (including critical care time)  Medications Ordered in ED Medications  sodium chloride flush (NS) 0.9 % injection 3 mL (has no administration in time range)     Initial Impression / Assessment and Plan / ED Course  I have reviewed the triage vital signs and the nursing notes.  Pertinent labs & imaging results that were available during my care of the patient were reviewed by me and considered in my medical decision making (see chart for details).        Patient to ED with headache, neck and back pain similar to symptoms experienced with aseptic meningitis in the past.   She has no neurologic deficits on exam. VSS, afebrile. IV medications provided for headache with some reduction  in pain.   Discussed with neuro-hospitalist Amada Jupiter) who advises increase to her Valtrex from 500 mg BID to 1 gm TID x 10 days. Dosing begun in ED. Fentanyl given for further headache relief.   On recheck, the patient is feeling much better. Will discharge home with increased Valtrex dosing. She is encouraged to see her neurologist for recheck as soon as possible. Return precautions discussed.   Final Clinical Impressions(s) / ED Diagnoses   Final diagnoses:  None   1. Mollaret's meningitis  ED Discharge Orders    None       Elpidio Anis, PA-C 06/18/19 0418    Dione Booze, MD 06/18/19 [phone]

## 2019-06-18 NOTE — Discharge Instructions (Signed)
Call your neurologist for an appointment for recheck and further treatment.   Return to the emergency department with any severe headache, high fever or new concern.  Increase your Valtrex to 1000 mg three times daily as prescribed for 10 days, then return to your regular dose of 500 mg twice daily.

## 2019-06-19 ENCOUNTER — Telehealth: Payer: Self-pay | Admitting: Neurology

## 2019-06-19 NOTE — Telephone Encounter (Signed)
This patient last saw Dr. Jaynee Eagles back in 2016, but the ED has referred her back to [place] for Mollaret's meningitis. I see in Dr. Cathren Laine notes, this was an issue back then as well. I went ahead and scheduled her for the next available, but the pt does have some questions she'd like answered before then. She requested the nurse give her a call back at [phone].  Thank you

## 2019-06-20 NOTE — Telephone Encounter (Signed)
I returned the pt's call. She said she is going to see PCP tomorrow for follow up. Pt will keep her 12/14 appt with Dr. Jaynee Eagles and if needed she will call back to see about getting a sooner appointment. She verbalized appreciation for the call.

## 2019-06-21 DIAGNOSIS — G43919 Migraine, unspecified, intractable, without status migrainosus: Secondary | ICD-10-CM | POA: Diagnosis not present

## 2019-06-21 DIAGNOSIS — G030 Nonpyogenic meningitis: Secondary | ICD-10-CM | POA: Diagnosis not present

## 2019-07-22 ENCOUNTER — Encounter: Payer: Self-pay | Admitting: Neurology

## 2019-07-22 ENCOUNTER — Ambulatory Visit (INDEPENDENT_AMBULATORY_CARE_PROVIDER_SITE_OTHER): Payer: BC Managed Care – PPO | Admitting: Neurology

## 2019-07-22 ENCOUNTER — Other Ambulatory Visit: Payer: Self-pay

## 2019-07-22 VITALS — BP 115/80 | HR 74 | Temp 97.5°F | Ht 67.0 in | Wt 187.0 lb

## 2019-07-22 DIAGNOSIS — G43711 Chronic migraine without aura, intractable, with status migrainosus: Secondary | ICD-10-CM | POA: Insufficient documentation

## 2019-07-22 DIAGNOSIS — R519 Headache, unspecified: Secondary | ICD-10-CM

## 2019-07-22 DIAGNOSIS — G441 Vascular headache, not elsewhere classified: Secondary | ICD-10-CM

## 2019-07-22 DIAGNOSIS — R6883 Chills (without fever): Secondary | ICD-10-CM

## 2019-07-22 DIAGNOSIS — G039 Meningitis, unspecified: Secondary | ICD-10-CM | POA: Diagnosis not present

## 2019-07-22 DIAGNOSIS — M436 Torticollis: Secondary | ICD-10-CM | POA: Diagnosis not present

## 2019-07-22 MED ORDER — AJOVY 225 MG/1.5ML ~~LOC~~ SOAJ: 225.0000 mg | SUBCUTANEOUS | 11 refills | Status: DC

## 2019-07-22 MED ORDER — RIZATRIPTAN BENZOATE 10 MG PO TBDP: 10.0000 mg | ORAL_TABLET | ORAL | 11 refills | Status: DC | PRN

## 2019-07-22 MED ORDER — UBRELVY 100 MG PO TABS: 100.0000 mg | ORAL_TABLET | ORAL | 11 refills | Status: DC | PRN

## 2019-07-22 MED ORDER — METHYLPREDNISOLONE 4 MG PO TBPK: ORAL_TABLET | ORAL | 1 refills | Status: DC

## 2019-07-22 NOTE — Progress Notes (Signed)
[facility]    Provider:  Dr Jaynee Eagles Referring Provider: Merrilee Seashore, MD Primary Care Physician:  Merrilee Seashore, MD  CC:  Recurrent meningitis and migraines  HPI 07/22/2019 [name] is a 38 y.o. female here as a referral from Dr. Ashby Dawes for recurent meningitis and migraines. She has a long history of migraines.  She had a very bad episode and went to the emergency room, she went to the ED, they did not perform an LP, stiff neck, headache, nausea, couldn't move her neck, she had symptoms of meningitis. She had chills, possibly fevers, did not get an MRI either.  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 She has daily headaches. She also has migraines with nausea, vomiting, unilateral, pulsating/pounding/throbbing/light and sound sensitivity, some sharp and shooting pains, no regular aura (occasional aura light and circles), no medication overuse,  Migraines last 24-72 hours and are moderately severe to severe, 10-15 migraine days a month, ongoing for over a year. No vision changes, no numbness or tingling, she has light/smell.sound sensitivity, a dark room helps, benadryl helps a little. She is not having anymore children.   Tried: flexeril, maxalt, topamax, nortriptyline, metoprolol  Personally reviewed MRI brain images: Enhancement of the sulci, empty sella, needs repeat imaging  Cbc/cmp unremarkable  Reviewed ED notes: was seen for headache and neck pain x 2 days, nausea, vomitig,exam non focal,  No neurologic deficits, IV meds provided for headache with reduction in pain, it was discussed with neurohospitalist who changed Valtrex dosing.  HPI 10/14/2014:  [name] is a 38 y.o. female here as a referral from Dr. Ashby Dawes for recurent meningitis and migraines.   She has had viral meningitis 5x since 2003, headache, stiff neck, feeling sick, goes away in 5  days approximately. Now taking anti-viral daily. She is on the Valtrex every day twice daily 500mg  twice daily. urring. 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 She has seen multiple infectious disease doctors. She has resultant migraines. Her last episode of meningitis was February of last year. She has resultant headaches. She has headaches that last for weeks at a time. She lives with the pain but sometimes she goes and gets the migraine cocktail. At home she takes excedrin migraine if really needed, she tries not to take it every day. The headache is on one side behind the ear, pulsating throbbing pain, +nausea, +light sensitivity, has blurry vision changes, worsening in severity and frequency, can be 10/10 and misses work. She is getting auras. She uses maxalt for acute management. She gets them 5 days a week. She has tried topamax. Never tried depakote. She is not taking her metoprolol daily, the nurse at the [place] office prescribed it and so did her doctor. She tried it for a week. Migraines ha been occurring for years. She is not sleeping well at night. She has two children, she tries to go to bed by 10 but she gets up at 5:30 to 6am in the morning. She doesn't sleep like she used to. She drinks a lot of caffeine. Coffee, tea, pepsi, all day. Not breast feeding. No plans for more children currently, using BC.   Reviewed notes, labs and imaging from outside physicians, which showed:  CSF 09/24/14 HSV neg WBC 1 (190 one year ago) Protein 15 (51 a yar ago) Glucose nml  Ct 09/2013 showed No acute intracranial abnormalities including mass lesion or mass effect,  hydrocephalus, extra-axial fluid collection, midline shift, hemorrhage, or acute infarction, large ischemic events (personally reviewed images)  Notes from hospitalization in 09/2013 states patient admitted for headache, back pain, neck stiffness and severe headache with csf suggestive of aseptic meningitis, febrile to 102 and LP with 190 WBCs lymphs  no organismas. In February this yea she was seen in the ED with head, neck and back pain, fevers. LP did not show WBCs so appears patient was sent home with pcp follow up.  She was seen in the ED in 2013 with migraines, 10/10 pain right side of the head with photo/phonophobia and nausea. Was given migraine cocktail with IV fluids and felt better.  Review of Systems: Patient complains of symptoms per HPI as well as the following symptoms: headache. Pertinent negatives per HPI. All others negative.   Social History   Socioeconomic History  . Marital status: Married    Spouse name: Darren  . Number of children: 2  . Years of education: 55  . Highest education level: Not on file  Occupational History  . Occupation: Doctor, hospital  Tobacco Use  . Smoking status: Never Smoker  . Smokeless tobacco: Never Used  . Tobacco comment: NEVER USED TOBACCO  Substance and Sexual Activity  . Alcohol use: Yes    Alcohol/week: 0.0 standard drinks    Comment: Very rare  . Drug use: No  . Sexual activity: Yes    Birth control/protection: Pill  Other Topics Concern  . Not on file  Social History Narrative   Lives at home husband and 2 kids.   Right handed.   Caffeine use: sometimes a cup of a soda/day, may or may not be caffeinated. Coffee: 2-3 times per week   Social Determinants of Health   Financial Resource Strain:   . Difficulty of Paying Living Expenses: Not on file  Food Insecurity:   . Worried About Programme researcher, broadcasting/film/video in the Last Year: Not on file  . Ran Out of Food in the Last Year: Not on file  Transportation Needs:   . Lack of Transportation (Medical): Not on file  . Lack of Transportation (Non-Medical): Not on file  Physical Activity:   . Days of Exercise per Week: Not on file  . Minutes of Exercise per Session: Not on file  Stress:   . Feeling of Stress : Not on file  Social Connections:   . Frequency of Communication with Friends and Family: Not on file  . Frequency of  Social Gatherings with Friends and Family: Not on file  . Attends Religious Services: Not on file  . Active Member of Clubs or Organizations: Not on file  . Attends Banker Meetings: Not on file  . Marital Status: Not on file  Intimate Partner Violence:   . Fear of Current or Ex-Partner: Not on file  . Emotionally Abused: Not on file  . Physically Abused: Not on file  . Sexually Abused: Not on file    Family History  Problem Relation Age of Onset  . Allergic rhinitis Mother   . Cirrhosis Father   . Alcoholism Father   . Breast cancer Maternal Grandmother        great-grandmother  . Breast cancer Cousin   . Diabetes Paternal Grandfather   . Allergic rhinitis Maternal Aunt   . Migraines Neg Hx     Past Medical History:  Diagnosis Date  . Anemia   . Iron deficiency anemia 11/05/2014  . Menometrorrhagia 11/05/2014  .  Migraine   . Viral meningitis     Past Surgical History:  Procedure Laterality Date  . CESAREAN SECTION N/A 06/15/2013   Procedure: CESAREAN SECTION;  Surgeon: Philip Aspen, DO;  Location: [facility];  Service: Obstetrics;  Laterality: N/A;  . FETAL BLOOD TRANSFUSION  2014   With C-section    Current Outpatient Medications  Medication Sig Dispense Refill  . azelastine (ASTELIN) 0.1 % nasal spray Use 1-2 sprays twice a day as needed for runny nose. 30 mL 5  . diphenhydrAMINE (BENADRYL) 25 MG tablet Take 25 mg by mouth every 6 (six) hours as needed for allergies.     . fexofenadine (ALLEGRA ALLERGY) 180 MG tablet Take 1 tablet (180 mg total) by mouth 2 (two) times a day. 60 tablet 5  . fluticasone (FLONASE) 50 MCG/ACT nasal spray 1-2 spray daily. 16 g 5  . HYDROcodone-acetaminophen (NORCO/VICODIN) 5-325 MG tablet Take 1 tablet by mouth 3 (three) times daily as needed.    Marland Kitchen ibuprofen (ADVIL,MOTRIN) 200 MG tablet Take 800 mg by mouth every 6 (six) hours as needed for moderate pain.    . meloxicam (MOBIC) 15 MG tablet Take 15 mg by mouth daily as needed.      . SPRINTEC 28 0.25-35 MG-MCG tablet Take 1 tablet by mouth daily.    . valACYclovir (VALTREX) 500 MG tablet Take 1 tablet by mouth 2 (two) times daily.    . Fremanezumab-vfrm (AJOVY) 225 MG/1.5ML SOAJ Inject 225 mg into the skin every 30 (thirty) days. 1 pen 11  . methylPREDNISolone (MEDROL DOSEPAK) 4 MG TBPK tablet Take pills every morning all together with food for 6 days. 21 tablet 1  . rizatriptan (MAXALT-MLT) 10 MG disintegrating tablet Take 1 tablet (10 mg total) by mouth as needed for migraine. May repeat in 2 hours if needed 9 tablet 11  . Ubrogepant (UBRELVY) 100 MG TABS Take 100 mg by mouth every 2 (two) hours as needed. Maximum  a day. 10 tablet 11   No current facility-administered medications for this visit.    Allergies as of 07/22/2019 - Review Complete 07/22/2019  Allergen Reaction Noted  . Citric acid  05/23/2011  . Latex Rash 06/14/2013  . Penicillins cross reactors Hives and Rash 05/23/2011    Vitals: BP 115/80 (BP Location: Right Arm, Patient Position: Sitting)   Pulse 74   Temp (!) 97.5 F (36.4 C) Comment: taken at front  Ht  (1.702 m)   Wt 187 lb (84.8 kg)   Breastfeeding No   BMI 29.29 kg/m  Last Weight:  Wt Readings from Last 1 Encounters:  07/22/19 187 lb (84.8 kg)   Last Height:   Ht Readings from Last 1 Encounters:  07/22/19  (1.702 m)   Physical exam: Exam: Gen: NAD, conversant, well nourised, obese, well groomed                     CV: RRR, no MRG. No Carotid Bruits. No peripheral edema, warm, nontender Eyes: Conjunctivae clear without exudates or hemorrhage  Neuro: Detailed Neurologic Exam  Speech:    Speech is normal; fluent and spontaneous with normal comprehension.  Cognition:    The patient is oriented to person, place, and time;     recent and remote memory intact;     language fluent;     normal attention, concentration,     fund of knowledge Cranial Nerves:    The pupils are equal, round, and reactive to  light. The  fundi are normal and spontaneous venous pulsations are present. Visual fields are full to finger confrontation. Extraocular movements are intact. Trigeminal sensation is intact and the muscles of mastication are normal. The face is symmetric. The palate elevates in the midline. Hearing intact. Voice is normal. Shoulder shrug is normal. The tongue has normal motion without fasciculations.   Coordination:    Normal finger to nose and heel to shin. Normal rapid alternating movements.   Gait:    Heel-toe and tandem gait are normal.   Motor Observation:    No asymmetry, no atrophy, and no involuntary movements noted. Tone:    Normal muscle tone.    Posture:    Posture is normal. normal erect    Strength:    Strength is V/V in the upper and lower limbs.      Sensation: intact to LT     Reflex Exam:  DTR's:    Deep tendon reflexes in the upper and lower extremities are normal bilaterally.   Toes:    The toes are downgoing bilaterally.   Clonus:    Clonus is absent.       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  Continue daily valtrex. She has tried and failed multiple medicationss: Tried: flexeril, maxalt, topamax, nortriptyline, metoprolol  - She had a very bad episode and went to the emergency room, she went to the ED, they did not perform an LP, stiff neck, headache, nausea, couldn't move her neck, she had symptoms of meningitis. She had chills, possibly fevers, did not get an MRI either. 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   - Preventative: Ajovy  - Acute: Maxalt and Ubrelvy  - Bridge: Medrol dosepak  Discussed: To prevent or relieve headaches, try the following: Cool Compress. Lie down and place a cool compress on your head.  Avoid headache triggers. If certain foods or odors seem to have  triggered your migraines in the past, avoid them. A headache diary might help you identify triggers.  Include physical activity in your daily routine. Try a daily walk or other moderate aerobic exercise.  Manage stress. Find healthy ways to cope with the stressors, such as delegating tasks on your to-do list.  Practice relaxation techniques. Try deep breathing, yoga, massage and visualization.  Eat regularly. Eating regularly scheduled meals and maintaining a healthy diet might help prevent headaches. Also, drink plenty of fluids.  Follow a regular sleep schedule. Sleep deprivation might contribute to headaches Consider biofeedback. With this mind-body technique, you learn to control certain bodily functions -- such as muscle tension, heart rate and blood pressure -- to prevent headaches or reduce headache pain.    Proceed to emergency room if you experience new or worsening symptoms or symptoms do not resolve, if you have new neurologic symptoms or if headache is severe, or for any concerning symptom.   Provided education and documentation from American headache Society toolbox including articles on: chronic migraine medication overuse headache, chronic migraines, prevention of migraines, behavioral and other nonpharmacologic treatments for headache.  Orders Placed This Encounter  Procedures  . MR BRAIN W WO CONTRAST   Meds ordered this encounter  Medications  . Fremanezumab-vfrm (AJOVY) 225 MG/1.5ML SOAJ    Sig: Inject 225 mg into the skin every 30 (thirty) days.    Dispense:  1 pen    Refill:  11  Patient has copay card; she can have medication for $5 regardless of insurance approval or copay amount.  . rizatriptan (MAXALT-MLT) 10 MG disintegrating tablet    Sig: Take 1 tablet (10 mg total) by mouth as needed for migraine. May repeat in 2 hours if needed    Dispense:  9 tablet    Refill:  11  . Ubrogepant (UBRELVY) 100 MG TABS    Sig: Take 100 mg by mouth every 2 (two) hours as  needed. Maximum  a day.    Dispense:  10 tablet    Refill:  11    Patient has copay card; she can have medication for $5 regardless of insurance approval or copay amount.  . methylPREDNISolone (MEDROL DOSEPAK) 4 MG TBPK tablet    Sig: Take pills every morning all together with food for 6 days.    Dispense:  21 tablet    Refill:  1      Naomie Dean, MD  [facility] [address] [place], [place] [address]  Phone [phone] Fax [phone]

## 2019-07-22 NOTE — Patient Instructions (Addendum)
MRi brain w/wo contrast Ubrelvy:Please take one tablet at the onset of your headache. If it does not improve the symptoms please take one additional tablet. Do not take more then 2 tablets in 24hrs. Do not take use more then 2 to 3 times in a week.  Maxalt: Please take one tablet at the onset of your headache. If it does not improve the symptoms please take one additional tablet. Do not take more then 2 tablets in 24hrs. Do not take use more then 2 to 3 times in a week.  Ajovy: monthly  Medrol dospeak: 6 days  Ubrogepant: Patient drug information  Access [employer] Online here.  Copyright [phone] [employer] All rights reserved.  (For additional information see "Ubrogepant: Drug information") Brand Names: [place]  Bernita RaisinUbrelvy What is this drug used for?  . It is used to treat migraine headaches. What do I need to tell my doctor BEFORE I take this drug?  . If you are allergic to this drug; any part of this drug; or any other drugs, foods, or substances. Tell your doctor about the allergy and what signs you had.  . If you have kidney disease.  . If you take any drugs (prescription or OTC, natural products, vitamins) that must not be taken with this drug, like certain drugs that are used for HIV, infections, or seizures. There are many drugs that must not be taken with this drug.  This is not a list of all drugs or health problems that interact with this drug.  Tell your doctor and pharmacist about all of your drugs (prescription or OTC, natural products, vitamins) and health problems. You must check to make sure that it is safe for you to take this drug with all of your drugs and health problems. Do not start, stop, or change the dose of any drug without checking with your doctor. What are some things I need to know or do while I take this drug?  . Tell all of your health care providers that you take this drug. This includes your doctors, nurses, pharmacists, and dentists.  . If you drink grapefruit  juice or eat grapefruit often, talk with your doctor.  . Tell your doctor if you are pregnant, plan on getting pregnant, or are breast-feeding. You will need to talk about the benefits and risks to you and the baby. What are some side effects that I need to call my doctor about right away?  WARNING/CAUTION: Even though it may be rare, some people may have very bad and sometimes deadly side effects when taking a drug. Tell your doctor or get medical help right away if you have any of the following signs or symptoms that may be related to a very bad side effect:  . Signs of an allergic reaction, like rash; hives; itching; red, swollen, blistered, or peeling skin with or without fever; wheezing; tightness in the chest or throat; trouble breathing, swallowing, or talking; unusual hoarseness; or swelling of the mouth, face, lips, tongue, or throat. What are some other side effects of this drug?  All drugs may cause side effects. However, many people have no side effects or only have minor side effects. Call your doctor or get medical help if any of these side effects or any other side effects bother you or do not go away:  . Upset stomach.  . Feeling sleepy.  These are not all of the side effects that may occur. If you have questions about side effects, call your  doctor. Call your doctor for medical advice about side effects.  You may report side effects to your national health agency. How is this drug best taken?  Use this drug as ordered by your doctor. Read all information given to you. Follow all instructions closely.  . Take with or without food.  . Take as early as you can after the attack has started.  . If needed, another dose may be taken as your doctor has told you. Be sure you know how many hours to wait before taking another dose. What do I do if I miss a dose?  Marland Kitchen This drug is taken on an as needed basis. Do not take more often than told by the doctor. How do I store and/or throw out this  drug?  . Store at room temperature in a dry place. Do not store in a bathroom.  Marland Kitchen Keep all drugs in a safe place. Keep all drugs out of the reach of children and pets.  . Throw away unused or expired drugs. Do not flush down a toilet or pour down a drain unless you are told to do so. Check with your pharmacist if you have questions about the best way to throw out drugs. There may be drug take-back programs in your area. General drug facts  . If your symptoms or health problems do not get better or if they become worse, call your doctor.  . Do not share your drugs with others and do not take anyone else's drugs.  . Some drugs may have another patient information leaflet. If you have any questions about this drug, please talk with your doctor, nurse, pharmacist, or other health care provider.  . If you think there has been an overdose, call your poison control center or get medical care right away. Be ready to tell or show what was taken, how much, and when it happened.   Methylprednisolone tablets What is this medicine? METHYLPREDNISOLONE (meth ill pred NISS oh lone) is a corticosteroid. It is commonly used to treat inflammation of the skin, joints, lungs, and other organs. Common conditions treated include asthma, allergies, and arthritis. It is also used for other conditions, such as blood disorders and diseases of the adrenal glands. This medicine may be used for other purposes; ask your health care provider or pharmacist if you have questions. COMMON BRAND NAME(S): Medrol, Medrol Dosepak What should I tell my health care provider before I take this medicine? They need to know if you have any of these conditions:  Cushing's syndrome  eye disease, vision problems  diabetes  glaucoma  heart disease  high blood pressure  infection (especially a virus infection such as chickenpox, cold sores, or herpes)  liver disease  mental illness  myasthenia gravis  osteoporosis  recently  received or scheduled to receive a vaccine  seizures  stomach or intestine problems  thyroid disease  an unusual or allergic reaction to lactose, methylprednisolone, other medicines, foods, dyes, or preservatives  pregnant or trying to get pregnant  breast-feeding How should I use this medicine? Take this medicine by mouth with a glass of water. Follow the directions on the prescription label. Take this medicine with food. If you are taking this medicine once a day, take it in the morning. Do not take it more often than directed. Do not suddenly stop taking your medicine because you may develop a severe reaction. Your doctor will tell you how much medicine to take. If your doctor wants you to  stop the medicine, the dose may be slowly lowered over time to avoid any side effects. Talk to your pediatrician regarding the use of this medicine in children. Special care may be needed. Overdosage: If you think you have taken too much of this medicine contact a poison control center or emergency room at once. NOTE: This medicine is only for you. Do not share this medicine with others. What if I miss a dose? If you miss a dose, take it as soon as you can. If it is almost time for your next dose, talk to your doctor or health care professional. You may need to miss a dose or take an extra dose. Do not take double or extra doses without advice. What may interact with this medicine? Do not take this medicine with any of the following medications:  alefacept  echinacea  live virus vaccines  metyrapone  mifepristone This medicine may also interact with the following medications:  amphotericin B  aspirin and aspirin-like medicines  certain antibiotics like erythromycin, clarithromycin, troleandomycin  certain medicines for diabetes  certain medicines for fungal infections like ketoconazole  certain medicines for seizures like carbamazepine, phenobarbital, phenytoin  certain medicines  that treat or prevent blood clots like warfarin  cholestyramine  cyclosporine  digoxin  diuretics  female hormones, like estrogens and birth control pills  isoniazid  NSAIDs, medicines for pain inflammation, like ibuprofen or naproxen  other medicines for myasthenia gravis  rifampin  vaccines This list may not describe all possible interactions. Give your health care provider a list of all the medicines, herbs, non-prescription drugs, or dietary supplements you use. Also tell them if you smoke, drink alcohol, or use illegal drugs. Some items may interact with your medicine. What should I watch for while using this medicine? Tell your doctor or healthcare professional if your symptoms do not start to get better or if they get worse. Do not stop taking except on your doctor's advice. You may develop a severe reaction. Your doctor will tell you how much medicine to take. This medicine may increase your risk of getting an infection. Tell your doctor or health care professional if you are around anyone with measles or chickenpox, or if you develop sores or blisters that do not heal properly. This medicine may increase blood sugar levels. Ask your healthcare provider if changes in diet or medicines are needed if you have diabetes. Tell your doctor or health care professional right away if you have any change in your eyesight. Using this medicine for a long time may increase your risk of low bone mass. Talk to your doctor about bone health. What side effects may I notice from receiving this medicine? Side effects that you should report to your doctor or health care professional as soon as possible:  allergic reactions like skin rash, itching or hives, swelling of the face, lips, or tongue  bloody or tarry stools  hallucination, loss of contact with reality  muscle cramps  muscle pain  palpitations  signs and symptoms of high blood sugar such as being more thirsty or hungry or  having to urinate more than normal. You may also feel very tired or have blurry vision.  signs and symptoms of infection like fever or chills; cough; sore throat; pain or trouble passing urine Side effects that usually do not require medical attention (report to your doctor or health care professional if they continue or are bothersome):  changes in emotions or mood  constipation  diarrhea  excessive  hair growth on the face or body  headache  nausea, vomiting  trouble sleeping  weight gain This list may not describe all possible side effects. Call your doctor for medical advice about side effects. You may report side effects to FDA at 1-800-FDA-1088. Where should I keep my medicine? Keep out of the reach of children. Store at room temperature between 20 and 25 degrees C (68 and 77 degrees F). Throw away any unused medicine after the expiration date. NOTE: This sheet is a summary. It may not cover all possible information. If you have questions about this medicine, talk to your doctor, pharmacist, or health care provider.  2020 Elsevier/Gold Standard (2018-04-26 09:19:36) Rolanda Lundborg injection What is this medicine? FREMANEZUMAB (fre ma NEZ ue mab) is used to prevent migraine headaches. This medicine may be used for other purposes; ask your health care provider or pharmacist if you have questions. COMMON BRAND NAME(S): AJOVY What should I tell my health care provider before I take this medicine? They need to know if you have any of these conditions:  an unusual or allergic reaction to fremanezumab, other medicines, foods, dyes, or preservatives  pregnant or trying to get pregnant  breast-feeding How should I use this medicine? This medicine is for injection under the skin. You will be taught how to prepare and give this medicine. Use exactly as directed. Take your medicine at regular intervals. Do not take your medicine more often than directed. It is important that you put  your used needles and syringes in a special sharps container. Do not put them in a trash can. If you do not have a sharps container, call your pharmacist or healthcare provider to get one. Talk to your pediatrician regarding the use of this medicine in children. Special care may be needed. Overdosage: If you think you have taken too much of this medicine contact a poison control center or emergency room at once. NOTE: This medicine is only for you. Do not share this medicine with others. What if I miss a dose? If you miss a dose, take it as soon as you can. If it is almost time for your next dose, take only that dose. Do not take double or extra doses. What may interact with this medicine? Interactions are not expected. This list may not describe all possible interactions. Give your health care provider a list of all the medicines, herbs, non-prescription drugs, or dietary supplements you use. Also tell them if you smoke, drink alcohol, or use illegal drugs. Some items may interact with your medicine. What should I watch for while using this medicine? Tell your doctor or healthcare professional if your symptoms do not start to get better or if they get worse. What side effects may I notice from receiving this medicine? Side effects that you should report to your doctor or health care professional as soon as possible:  allergic reactions like skin rash, itching or hives, swelling of the face, lips, or tongue Side effects that usually do not require medical attention (report these to your doctor or health care professional if they continue or are bothersome):  pain, redness, or irritation at site where injected This list may not describe all possible side effects. Call your doctor for medical advice about side effects. You may report side effects to FDA at 1-800-FDA-1088. Where should I keep my medicine? Keep out of the reach of children. You will be instructed on how to store this medicine. Throw  away any unused medicine after  the expiration date on the label. NOTE: This sheet is a summary. It may not cover all possible information. If you have questions about this medicine, talk to your doctor, pharmacist, or health care provider.  2020 Elsevier/Gold Standard (2017-04-24 17:22:56) Rizatriptan disintegrating tablets What is this medicine? RIZATRIPTAN (rye za TRIP tan) is used to treat migraines with or without aura. An aura is a strange feeling or visual disturbance that warns you of an attack. It is not used to prevent migraines. This medicine may be used for other purposes; ask your health care provider or pharmacist if you have questions. COMMON BRAND NAME(S): Maxalt-MLT What should I tell my health care provider before I take this medicine? They need to know if you have any of these conditions:  cigarette smoker  circulation problems in fingers and toes  diabetes  heart disease  high blood pressure  high cholesterol  history of irregular heartbeat  history of stroke  kidney disease  liver disease  stomach or intestine problems  an unusual or allergic reaction to rizatriptan, other medicines, foods, dyes, or preservatives  pregnant or trying to get pregnant  breast-feeding How should I use this medicine? Take this medicine by mouth. Follow the directions on the prescription label. Leave the tablet in the sealed blister pack until you are ready to take it. With dry hands, open the blister and gently remove the tablet. If the tablet breaks or crumbles, throw it away and take a new tablet out of the blister pack. Place the tablet in the mouth and allow it to dissolve, and then swallow. Do not cut, crush, or chew this medicine. You do not need water to take this medicine. Do not take it more often than directed. Talk to your pediatrician regarding the use of this medicine in children. While this drug may be prescribed for children as young as 6 years for selected  conditions, precautions do apply. Overdosage: If you think you have taken too much of this medicine contact a poison control center or emergency room at once. NOTE: This medicine is only for you. Do not share this medicine with others. What if I miss a dose? This does not apply. This medicine is not for regular use. What may interact with this medicine? Do not take this medicine with any of the following medicines:  certain medicines for migraine headache like almotriptan, eletriptan, frovatriptan, naratriptan, rizatriptan, sumatriptan, zolmitriptan  ergot alkaloids like dihydroergotamine, ergonovine, ergotamine, methylergonovine  MAOIs like Carbex, Eldepryl, Marplan, Nardil, and Parnate This medicine may also interact with the following medications:  certain medicines for depression, anxiety, or psychotic disorders  propranolol This list may not describe all possible interactions. Give your health care provider a list of all the medicines, herbs, non-prescription drugs, or dietary supplements you use. Also tell them if you smoke, drink alcohol, or use illegal drugs. Some items may interact with your medicine. What should I watch for while using this medicine? Visit your healthcare professional for regular checks on your progress. Tell your healthcare professional if your symptoms do not start to get better or if they get worse. You may get drowsy or dizzy. Do not drive, use machinery, or do anything that needs mental alertness until you know how this medicine affects you. Do not stand up or sit up quickly, especially if you are an older patient. This reduces the risk of dizzy or fainting spells. Alcohol may interfere with the effect of this medicine. Your mouth may get dry. Chewing sugarless  gum or sucking hard candy and drinking plenty of water may help. Contact your healthcare professional if the problem does not go away or is severe. If you take migraine medicines for 10 or more days a  month, your migraines may get worse. Keep a diary of headache days and medicine use. Contact your healthcare professional if your migraine attacks occur more frequently. What side effects may I notice from receiving this medicine? Side effects that you should report to your doctor or health care professional as soon as possible:  allergic reactions like skin rash, itching or hives, swelling of the face, lips, or tongue  chest pain or chest tightness  signs and symptoms of a dangerous change in heartbeat or heart rhythm like chest pain; dizziness; fast, irregular heartbeat; palpitations; feeling faint or lightheaded; falls; breathing problems  signs and symptoms of a stroke like changes in vision; confusion; trouble speaking or understanding; severe headaches; sudden numbness or weakness of the face, arm or leg; trouble walking; dizziness; loss of balance or coordination  signs and symptoms of serotonin syndrome like irritable; confusion; diarrhea; fast or irregular heartbeat; muscle twitching; stiff muscles; trouble walking; sweating; high fever; seizures; chills; vomiting Side effects that usually do not require medical attention (report to your doctor or health care professional if they continue or are bothersome):  diarrhea  dizziness  drowsiness  dry mouth  headache  nausea, vomiting  pain, tingling, numbness in the hands or feet  stomach pain This list may not describe all possible side effects. Call your doctor for medical advice about side effects. You may report side effects to FDA at 1-800-FDA-1088. Where should I keep my medicine? Keep out of the reach of children. Store at room temperature between 15 and 30 degrees C (59 and 86 degrees F). Protect from light and moisture. Throw away any unused medicine after the expiration date. NOTE: This sheet is a summary. It may not cover all possible information. If you have questions about this medicine, talk to your doctor,  pharmacist, or health care provider.  2020 Elsevier/Gold Standard (2018-02-06 14:58:08)

## 2019-07-24 ENCOUNTER — Telehealth: Payer: Self-pay

## 2019-07-24 NOTE — Telephone Encounter (Signed)
Ajovy PA done on cover my meds. Your information has been submitted to [place]. [employer] [place] will review the request and fax you a determination directly, typically within 3 business days of your submission once all necessary information is received. If [employer] [place] has not responded in 3 business days or if you have any questions about your submission, contact [place] at [phone]

## 2019-07-29 NOTE — Telephone Encounter (Signed)
[name] was denied on July 26, 2019 by pts insurance.

## 2019-07-29 NOTE — Telephone Encounter (Signed)
Pt was sent mychart of denial of Ajovy and to activate access card for copayment or come by the office to pick one up.

## 2019-07-30 NOTE — Telephone Encounter (Addendum)
We received the denial letter from [name]. Trial of Aimovig and Emgality required. If we should choose to appeal, fax within 180 days to [phone]. Pt can use the savings card though.   I spoke with [place]. She stated the pt picked up the Ajovy on the 20th.

## 2019-08-06 ENCOUNTER — Other Ambulatory Visit: Payer: Self-pay

## 2019-08-06 ENCOUNTER — Ambulatory Visit: Payer: BC Managed Care – PPO

## 2019-08-06 DIAGNOSIS — G441 Vascular headache, not elsewhere classified: Secondary | ICD-10-CM | POA: Diagnosis not present

## 2019-08-06 DIAGNOSIS — R6883 Chills (without fever): Secondary | ICD-10-CM

## 2019-08-06 DIAGNOSIS — R519 Headache, unspecified: Secondary | ICD-10-CM

## 2019-08-06 DIAGNOSIS — G039 Meningitis, unspecified: Secondary | ICD-10-CM

## 2019-08-06 DIAGNOSIS — M436 Torticollis: Secondary | ICD-10-CM

## 2019-08-06 MED ORDER — GADOBENATE DIMEGLUMINE 529 MG/ML IV SOLN
15.0000 mL | Freq: Once | INTRAVENOUS | Status: AC | PRN
  Administered 2019-08-06: 15 mL via INTRAVENOUS

## 2019-09-04 DIAGNOSIS — G43711 Chronic migraine without aura, intractable, with status migrainosus: Secondary | ICD-10-CM

## 2019-09-05 ENCOUNTER — Telehealth: Payer: Self-pay | Admitting: *Deleted

## 2019-09-05 MED ORDER — UBRELVY 100 MG PO TABS: 100.0000 mg | ORAL_TABLET | ORAL | 11 refills | Status: DC | PRN

## 2019-09-05 NOTE — Telephone Encounter (Signed)
Completed Ubrelvy 100 mg PA on Cover My Meds. Key: [id number]. Awaiting determination from [employer].

## 2019-09-05 NOTE — Telephone Encounter (Signed)
Ok to send the [place] prescription to the pharmacy per Dr. Lucia Gaskins.

## 2019-09-05 NOTE — Addendum Note (Signed)
Addended by: Bertram Savin on: 09/05/2019 09:32 AM   Modules accepted: Orders

## 2019-09-09 ENCOUNTER — Encounter: Payer: Self-pay | Admitting: *Deleted

## 2019-09-09 NOTE — Telephone Encounter (Addendum)
Per CMM, Ubrelvy denied. No reason given. Appeal opened by [facility]. We received a letter. Insurance denied because of requirement that pt try/fail 2 triptans first. If we should choose to appeal, fax to [phone].  Pt should be able to use the savings card. Mychart message sent to pt.

## 2019-10-27 DIAGNOSIS — Z20828 Contact with and (suspected) exposure to other viral communicable diseases: Secondary | ICD-10-CM | POA: Diagnosis not present

## 2019-11-05 ENCOUNTER — Emergency Department (INDEPENDENT_AMBULATORY_CARE_PROVIDER_SITE_OTHER)
Admission: EM | Admit: 2019-11-05 | Discharge: 2019-11-05 | Disposition: A | Discharge status: Home / Self Care | Payer: BC Managed Care – PPO | Source: Home / Self Care

## 2019-11-05 ENCOUNTER — Other Ambulatory Visit: Payer: Self-pay

## 2019-11-05 ENCOUNTER — Emergency Department (INDEPENDENT_AMBULATORY_CARE_PROVIDER_SITE_OTHER): Payer: BC Managed Care – PPO

## 2019-11-05 ENCOUNTER — Encounter: Payer: Self-pay | Admitting: *Deleted

## 2019-11-05 DIAGNOSIS — U071 COVID-19: Secondary | ICD-10-CM | POA: Diagnosis not present

## 2019-11-05 DIAGNOSIS — R0602 Shortness of breath: Secondary | ICD-10-CM | POA: Diagnosis not present

## 2019-11-05 DIAGNOSIS — J1282 Pneumonia due to coronavirus disease 2019: Secondary | ICD-10-CM

## 2019-11-05 DIAGNOSIS — J9801 Acute bronchospasm: Secondary | ICD-10-CM

## 2019-11-05 DIAGNOSIS — R05 Cough: Secondary | ICD-10-CM

## 2019-11-05 MED ORDER — BENZONATATE 100 MG PO CAPS: 100.0000 mg | ORAL_CAPSULE | Freq: Three times a day (TID) | ORAL | 0 refills | Status: DC

## 2019-11-05 MED ORDER — PREDNISONE 20 MG PO TABS: ORAL_TABLET | ORAL | 0 refills | Status: DC

## 2019-11-05 MED ORDER — DOXYCYCLINE HYCLATE 100 MG PO CAPS: 100.0000 mg | ORAL_CAPSULE | Freq: Two times a day (BID) | ORAL | 0 refills | Status: DC

## 2019-11-05 MED ORDER — ALBUTEROL SULFATE HFA 108 (90 BASE) MCG/ACT IN AERS: 1.0000 | INHALATION_SPRAY | Freq: Four times a day (QID) | RESPIRATORY_TRACT | 0 refills | Status: AC | PRN

## 2019-11-05 MED ORDER — METHYLPREDNISOLONE SODIUM SUCC 40 MG IJ SOLR
80.0000 mg | Freq: Once | INTRAMUSCULAR | Status: AC
  Administered 2019-11-05: 80 mg via INTRAMUSCULAR

## 2019-11-05 NOTE — Discharge Instructions (Signed)
  Please take antibiotics as prescribed and be sure to complete entire course even if you start to feel better to ensure infection does not come back.  You were given a shot of solumedrol (a steroid) today to help with inflammation in your lungs to help you breath better and to help with your cough.  You have been prescribed 5 days of prednisone, an oral steroid.  You may start this medication tomorrow with breakfast.     Please follow up with your family doctor in a virtual visit if symptoms not improving. They may be able to direct you to a Covid specific clinic for treatment.  Your PCP should also be able to help with FMLA if needed.  Be sure to get at least 8 hours of sleep at night, preferably more when sick and stay well hydrated with a minimum of eight 8oz glasses of water a day.  Call 911 or go to the hospital if symptoms worsening- chest pain, trouble breathing, dizziness, passing out, or other new concerning symptoms develop.

## 2019-11-05 NOTE — ED Provider Notes (Signed)
[facility]    CSN: [id number] Arrival date & time: 11/05/19  0841      History   Chief Complaint Chief Complaint  Patient presents with  . Cough  . Shortness of Breath    HPI [name] is a 39 y.o. female.   HPI  [name] is a 39 y.o. female presenting to UC with c/o gradually worsening cough, congestion, chest tightness and SOB. She was dx with Covid-19 at a [facility] 8 days ago.  She has had 2 days of SOB and rattling in her chest.  Denies fever, chills, n/v/d. No hx of asthma. No chest pain. No dizziness.   Past Medical History:  Diagnosis Date  . Anemia   . Iron deficiency anemia 11/05/2014  . Menometrorrhagia 11/05/2014  . Migraine   . Viral meningitis     Patient Active Problem List   Diagnosis Date Noted  . Chronic migraine without aura, with intractable migraine, so stated, with status migrainosus 07/22/2019  . Other allergic rhinitis 11/19/2018  . Seasonal allergic conjunctivitis 11/19/2018  . Rash and other nonspecific skin eruption 11/19/2018  . 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 Iron deficiency anemia 11/05/2014  . Menometrorrhagia 11/05/2014  . Migraine with status migrainosus 10/14/2014  . 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 Meningitis 09/28/2013    Past Surgical History:  Procedure Laterality Date  . CESAREAN SECTION N/A 06/15/2013   Procedure: CESAREAN SECTION;  Surgeon: Philip Aspen, DO;  Location: [facility];  Service: Obstetrics;  Laterality: N/A;  . FETAL BLOOD TRANSFUSION  2014   With C-section    OB History    Gravida  2   Para  2   Term  2   Preterm      AB      Living  2     SAB      TAB      Ectopic      Multiple      Live Births  2            Home Medications    Prior to Admission medications   Medication Sig Start Date End Date Taking? Authorizing Provider  prednisoLONE 5 MG TABS tablet Take by mouth. Prn migraines   Yes [provider]  albuterol (VENTOLIN HFA) 108 (90  Base) MCG/ACT inhaler Inhale 1-2 puffs into the lungs every 6 (six) hours as needed for wheezing or shortness of breath. 11/05/19   Lurene Shadow, PA-C  azelastine (ASTELIN) 0.1 % nasal spray Use 1-2 sprays twice a day as needed for runny nose. 11/19/18   Ellamae Sia, DO  benzonatate (TESSALON) 100 MG capsule Take 1-2 capsules (100-200 mg total) by mouth every 8 (eight) hours. 11/05/19   Lurene Shadow, PA-C  diphenhydrAMINE (BENADRYL) 25 MG tablet Take 25 mg by mouth every 6 (six) hours as needed for allergies.     [provider]  doxycycline (VIBRAMYCIN) 100 MG capsule Take 1 capsule (100 mg total) by mouth 2 (two) times daily. One po bid x 7 days 11/05/19   Lurene Shadow, PA-C  fexofenadine [facility] ALLERGY) 180 MG tablet Take 1 tablet (180 mg total) by mouth 2 (two) times a day. 11/23/18   Ellamae Sia, DO  fluticasone (FLONASE) 50 MCG/ACT nasal spray 1-2 spray daily. 11/19/18   Ellamae Sia, DO  Fremanezumab-vfrm (AJOVY) 225 MG/1.5ML SOAJ Inject 225 mg into the skin every 30 (thirty) days. 07/22/19   [name], MD  ibuprofen (  ADVIL,MOTRIN) 200 MG tablet Take 800 mg by mouth every 6 (six) hours as needed for moderate pain.    [provider]  meloxicam (MOBIC) 15 MG tablet Take 15 mg by mouth daily as needed.  07/16/19   [provider]  predniSONE (DELTASONE) 20 MG tablet 3 tabs po day one, then 2 po daily x 4 days 11/05/19   Noe Gens, PA-C  rizatriptan (MAXALT-MLT) 10 MG disintegrating tablet Take 1 tablet (10 mg total) by mouth as needed for migraine. May repeat in 2 hours if needed 07/22/19   Melvenia Beam, MD  SPRINTEC 28 0.25-35 MG-MCG tablet Take 1 tablet by mouth daily. 07/10/19   [provider]  Ubrogepant (UBRELVY) 100 MG TABS Take 100 mg by mouth every 2 (two) hours as needed. Maximum 200mg  a day. 09/05/19   Melvenia Beam, MD  valACYclovir (VALTREX) 500 MG tablet Take 1 tablet by mouth 2 (two) times daily. 07/12/19   [provider]     Family History Family History  Problem Relation Age of Onset  . Allergic rhinitis Mother   . Cirrhosis Father   . Alcoholism Father   . Breast cancer Maternal Grandmother        great-grandmother  . Breast cancer Cousin   . Diabetes Paternal Grandfather   . Allergic rhinitis Maternal Aunt   . Migraines Neg Hx     Social History Social History   Tobacco Use  . Smoking status: Never Smoker  . Smokeless tobacco: Never Used  . Tobacco comment: NEVER USED TOBACCO  Substance Use Topics  . Alcohol use: Yes    Alcohol/week: 0.0 standard drinks    Comment: Very rare  . Drug use: No     Allergies   Citric acid, Latex, and Penicillins cross reactors   Review of Systems Review of Systems  Constitutional: Positive for fatigue. Negative for chills and fever.  HENT: Positive for congestion. Negative for ear pain, sore throat, trouble swallowing and voice change.   Respiratory: Positive for cough, chest tightness and shortness of breath.   Cardiovascular: Negative for chest pain and palpitations.  Gastrointestinal: Negative for abdominal pain, diarrhea, nausea and vomiting.  Musculoskeletal: Positive for arthralgias, back pain and myalgias.       Body aches  Skin: Negative for rash.  Neurological: Positive for headaches. Negative for dizziness and light-headedness.  All other systems reviewed and are negative.    Physical Exam Triage Vital Signs ED Triage Vitals  Enc Vitals Group     BP 11/05/19 0901 119/85     Pulse Rate 11/05/19 0901 88     Resp 11/05/19 0901 18     Temp 11/05/19 0901 98 F (36.7 C)     Temp Source 11/05/19 0901 Oral     SpO2 11/05/19 0901 98 %     Weight 11/05/19 0857 183 lb (83 kg)     Height 11/05/19 0857 5\' 7"  (1.702 m)     Head Circumference --      Peak Flow --      Pain Score 11/05/19 0856 0     Pain Loc --      Pain Edu? --      Excl. in [place]? --    No data found.  Updated Vital Signs BP 119/85 (BP Location: Left Arm)   Pulse 88    Temp 98 F (36.7 C) (Oral)   Resp 18   Ht 5\' 7"  (1.702 m)   Wt 183 lb (  83 kg)   LMP 11/05/2019   SpO2 98%   BMI 28.66 kg/m   Visual Acuity Right Eye Distance:   Left Eye Distance:   Bilateral Distance:    Right Eye Near:   Left Eye Near:    Bilateral Near:     Physical Exam Vitals and nursing note reviewed.  Constitutional:      General: She is not in acute distress.    Appearance: She is well-developed. She is ill-appearing ( appears mildly fatigued). She is not toxic-appearing or diaphoretic.  HENT:     Head: Normocephalic and atraumatic.     Right Ear: Tympanic membrane and ear canal normal.     Left Ear: Tympanic membrane and ear canal normal.     Nose: Nose normal.     Right Sinus: No maxillary sinus tenderness or frontal sinus tenderness.     Left Sinus: No maxillary sinus tenderness or frontal sinus tenderness.     Mouth/Throat:     Lips: Pink.     Mouth: Mucous membranes are moist.     Pharynx: Oropharynx is clear. Uvula midline.  Cardiovascular:     Rate and Rhythm: Normal rate and regular rhythm.  Pulmonary:     Effort: Pulmonary effort is normal.     Breath sounds: Examination of the right-lower field reveals decreased breath sounds. Examination of the left-lower field reveals decreased breath sounds and wheezing. Decreased breath sounds and wheezing present. No rhonchi or rales.     Comments: Able to speak in full sentences but mildly winded between sentences at times. Musculoskeletal:        General: Normal range of motion.     Cervical back: Normal range of motion and neck supple.  Lymphadenopathy:     Cervical: No cervical adenopathy.  Skin:    General: Skin is warm and dry.  Neurological:     Mental Status: She is alert and oriented to person, place, and time.  Psychiatric:        Behavior: Behavior normal.      UC Treatments / Results  Labs (all labs ordered are listed, but only abnormal results are displayed) Labs Reviewed - No data to  display  EKG   Radiology DG Chest 2 View  Result Date: 11/05/2019 CLINICAL DATA:  Increasing shortness of breath and cough. Diagnosed with COVID-19 infection 8 days ago. EXAM: CHEST - 2 VIEW COMPARISON:  Radiographs 06/17/2019. FINDINGS: There are lower lung volumes. The heart size and mediastinal contours are stable without evidence of adenopathy. There are new airspace opacities in both lower lobes which are suspicious for pneumonia. Pattern is nonspecific, and not typical of viral infection. No edema, pleural effusion or pneumothorax. The bones appear unchanged. IMPRESSION: New bilateral lower lobe airspace opacities consistent with pneumonia. Electronically Signed   By: Carey Bullocks M.D.   On: 11/05/2019 10:17    Procedures Procedures (including critical care time)  Medications Ordered in UC Medications  methylPREDNISolone sodium succinate (SOLU-MEDROL) 40 mg/mL injection 80 mg (80 mg Intramuscular Given 11/05/19 1023)    Initial Impression / Assessment and Plan / UC Course  I have reviewed the triage vital signs and the nursing notes.  Pertinent labs & imaging results that were available during my care of the patient were reviewed by me and considered in my medical decision making (see chart for details).    Discussed imaging with pt Will tx for secondary bacterial infection from Covid-19 virus. Work note provided for the next 2 days off.  Encouraged f/u with PCP if needed. Discussed symptoms that warrant emergent care in the ED. AVS provided  Final Clinical Impressions(s) / UC Diagnoses   Final diagnoses:  Pneumonia due to COVID-19 virus  Bronchospasm     Discharge Instructions      Please take antibiotics as prescribed and be sure to complete entire course even if you start to feel better to ensure infection does not come back.  You were given a shot of solumedrol (a steroid) today to help with inflammation in your lungs to help you breath better and to help with  your cough.  You have been prescribed 5 days of prednisone, an oral steroid.  You may start this medication tomorrow with breakfast.     Please follow up with your family doctor in a virtual visit if symptoms not improving. They may be able to direct you to a Covid specific clinic for treatment.  Your PCP should also be able to help with FMLA if needed.  Be sure to get at least 8 hours of sleep at night, preferably more when sick and stay well hydrated with a minimum of eight 8oz glasses of water a day.  Call 911 or go to the hospital if symptoms worsening- chest pain, trouble breathing, dizziness, passing out, or other new concerning symptoms develop.    ED Prescriptions    Medication Sig Dispense Auth. Provider   doxycycline (VIBRAMYCIN) 100 MG capsule Take 1 capsule (100 mg total) by mouth 2 (two) times daily. One po bid x 7 days 14 capsule Josephina Melcher O, PA-C   predniSONE (DELTASONE) 20 MG tablet 3 tabs po day one, then 2 po daily x 4 days 11 tablet Edell Mesenbrink O, PA-C   albuterol (VENTOLIN HFA) 108 (90 Base) MCG/ACT inhaler Inhale 1-2 puffs into the lungs every 6 (six) hours as needed for wheezing or shortness of breath. 8 g Zharia Conrow O, PA-C   benzonatate (TESSALON) 100 MG capsule Take 1-2 capsules (100-200 mg total) by mouth every 8 (eight) hours. 21 capsule Lurene Shadow, [place]     PDMP not reviewed this encounter.   Lurene Shadow, [place] 11/05/19 1108

## 2019-11-05 NOTE — ED Triage Notes (Signed)
Pt reports COVID positive test 8 days ago. 2 days of SOB and rattling in her chest.

## 2019-11-20 ENCOUNTER — Ambulatory Visit: Payer: BC Managed Care – PPO | Admitting: Neurology

## 2020-01-22 DIAGNOSIS — R002 Palpitations: Secondary | ICD-10-CM | POA: Diagnosis not present

## 2020-01-22 DIAGNOSIS — L659 Nonscarring hair loss, unspecified: Secondary | ICD-10-CM | POA: Diagnosis not present

## 2020-01-22 DIAGNOSIS — R42 Dizziness and giddiness: Secondary | ICD-10-CM | POA: Diagnosis not present

## 2020-01-22 DIAGNOSIS — R5383 Other fatigue: Secondary | ICD-10-CM | POA: Diagnosis not present

## 2020-03-04 ENCOUNTER — Ambulatory Visit: Payer: Self-pay | Admitting: Cardiology

## 2020-03-05 ENCOUNTER — Ambulatory Visit: Payer: Self-pay | Admitting: Cardiology

## 2020-05-05 DIAGNOSIS — Z20828 Contact with and (suspected) exposure to other viral communicable diseases: Secondary | ICD-10-CM | POA: Diagnosis not present

## 2020-05-06 DIAGNOSIS — M9902 Segmental and somatic dysfunction of thoracic region: Secondary | ICD-10-CM | POA: Diagnosis not present

## 2020-05-06 DIAGNOSIS — M546 Pain in thoracic spine: Secondary | ICD-10-CM | POA: Diagnosis not present

## 2020-05-06 DIAGNOSIS — M609 Myositis, unspecified: Secondary | ICD-10-CM | POA: Diagnosis not present

## 2020-05-28 DIAGNOSIS — Z01419 Encounter for gynecological examination (general) (routine) without abnormal findings: Secondary | ICD-10-CM | POA: Diagnosis not present

## 2020-05-28 DIAGNOSIS — L659 Nonscarring hair loss, unspecified: Secondary | ICD-10-CM | POA: Diagnosis not present

## 2020-05-28 DIAGNOSIS — Z1159 Encounter for screening for other viral diseases: Secondary | ICD-10-CM | POA: Diagnosis not present

## 2020-05-28 DIAGNOSIS — Z113 Encounter for screening for infections with a predominantly sexual mode of transmission: Secondary | ICD-10-CM | POA: Diagnosis not present

## 2020-05-28 DIAGNOSIS — Z6828 Body mass index (BMI) 28.0-28.9, adult: Secondary | ICD-10-CM | POA: Diagnosis not present

## 2020-06-09 DIAGNOSIS — G030 Nonpyogenic meningitis: Secondary | ICD-10-CM | POA: Diagnosis not present

## 2020-08-11 ENCOUNTER — Other Ambulatory Visit: Payer: Self-pay | Admitting: Neurology

## 2020-08-11 ENCOUNTER — Telehealth: Payer: Self-pay

## 2020-08-11 DIAGNOSIS — G43711 Chronic migraine without aura, intractable, with status migrainosus: Secondary | ICD-10-CM

## 2020-08-11 MED ORDER — METHYLPREDNISOLONE 4 MG PO TBPK: ORAL_TABLET | ORAL | 1 refills | Status: DC

## 2020-08-11 NOTE — Telephone Encounter (Signed)
That's fine, I called in a prescription for her. She has to follow up in the office for anything else. thanks

## 2020-08-11 NOTE — Telephone Encounter (Signed)
Pt left a VM this morning asking for a refill on prednisone. She has had a migraine for 4 days. She cannot come in for an infusion. [facility] in [place].

## 2020-09-04 ENCOUNTER — Other Ambulatory Visit: Payer: Self-pay | Admitting: Neurology

## 2020-09-04 DIAGNOSIS — G43711 Chronic migraine without aura, intractable, with status migrainosus: Secondary | ICD-10-CM

## 2020-09-16 ENCOUNTER — Other Ambulatory Visit: Payer: Self-pay | Admitting: Neurology

## 2020-09-16 DIAGNOSIS — G43711 Chronic migraine without aura, intractable, with status migrainosus: Secondary | ICD-10-CM

## 2020-11-30 DIAGNOSIS — R946 Abnormal results of thyroid function studies: Secondary | ICD-10-CM | POA: Diagnosis not present

## 2020-11-30 DIAGNOSIS — E559 Vitamin D deficiency, unspecified: Secondary | ICD-10-CM | POA: Diagnosis not present

## 2020-11-30 DIAGNOSIS — E611 Iron deficiency: Secondary | ICD-10-CM | POA: Diagnosis not present

## 2020-11-30 DIAGNOSIS — D649 Anemia, unspecified: Secondary | ICD-10-CM | POA: Diagnosis not present

## 2020-12-30 IMAGING — DX DG CHEST 2V
2 series · 2 of 2 positions shown · non-contrast
Comparison: Radiographs 06/17/2019.

CLINICAL DATA: Increasing shortness of breath and cough. Diagnosed
with [id number] infection 8 days ago.

EXAM:
CHEST - 2 VIEW

[chest pa]
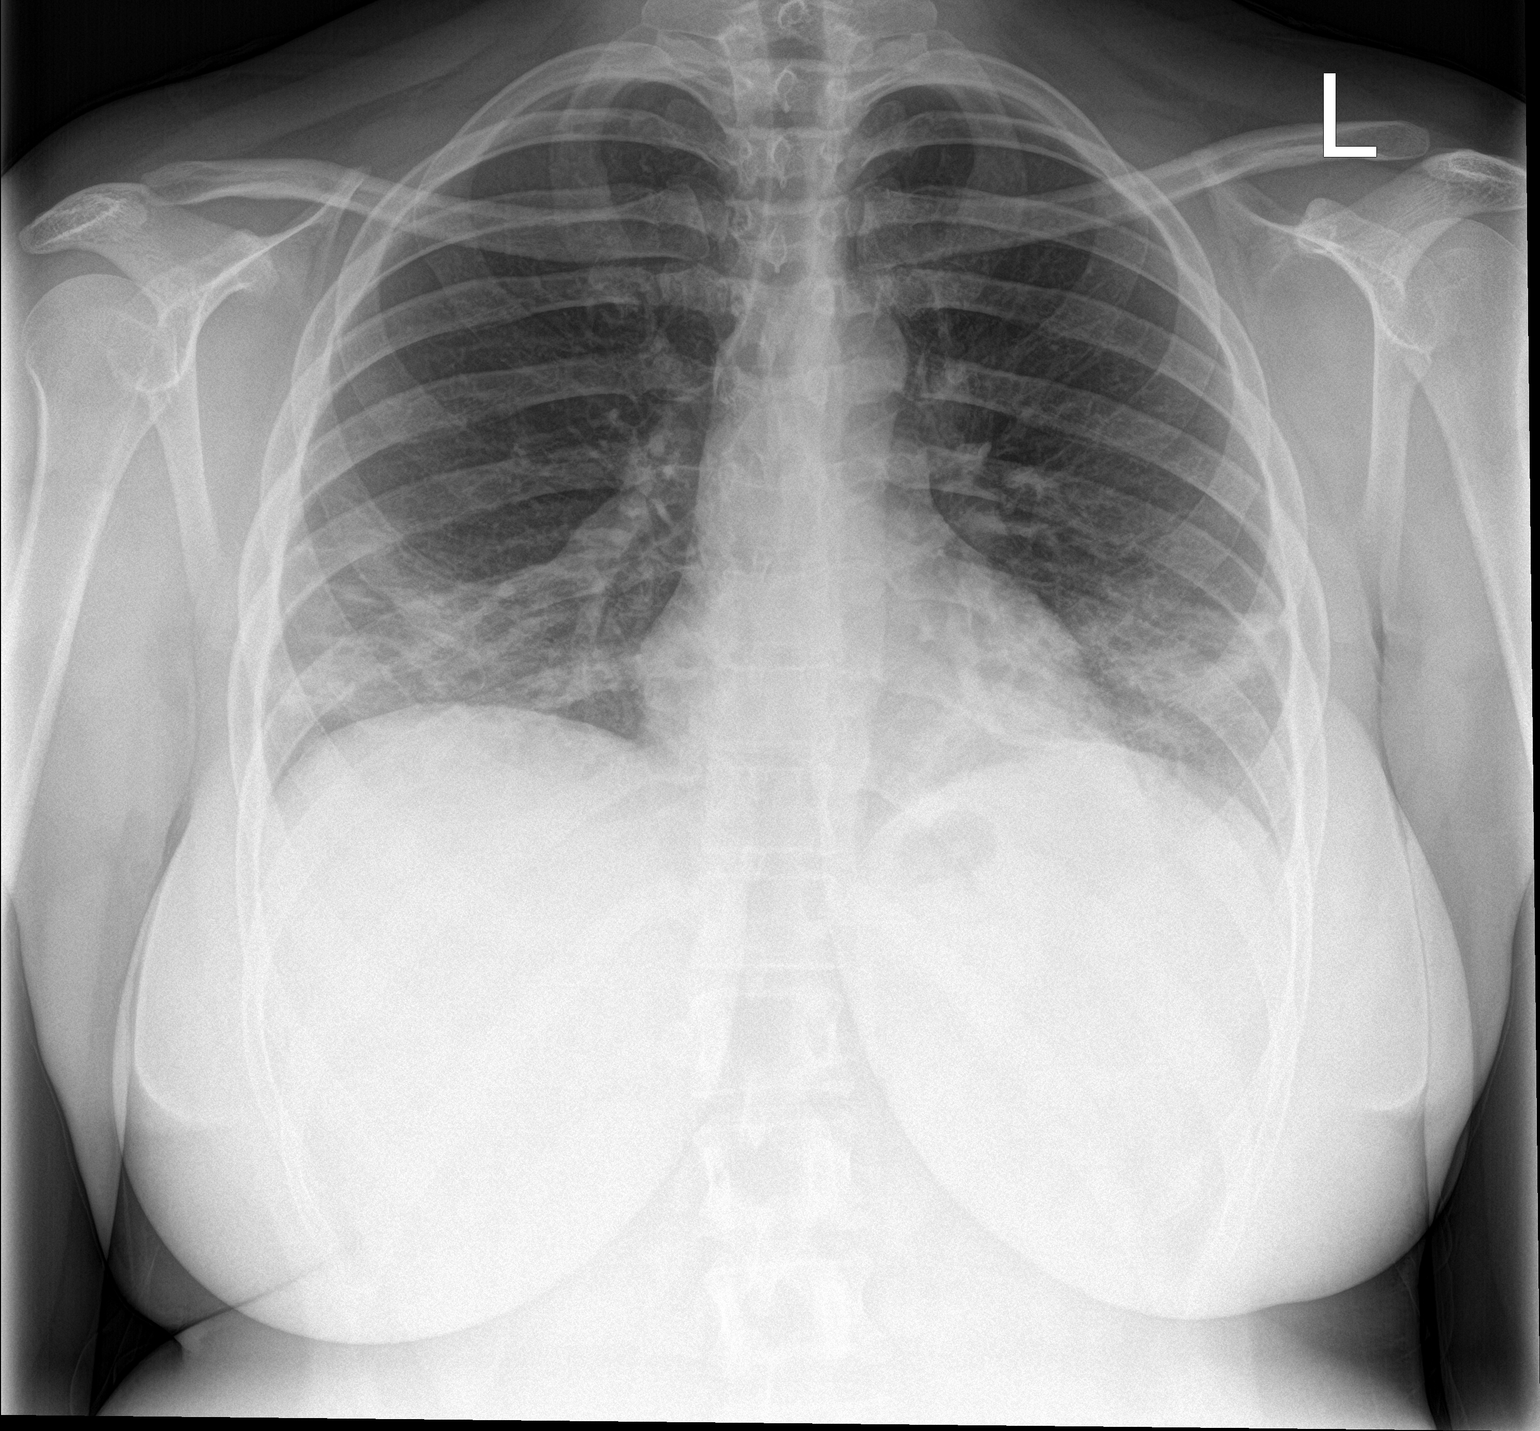

[chest lat]
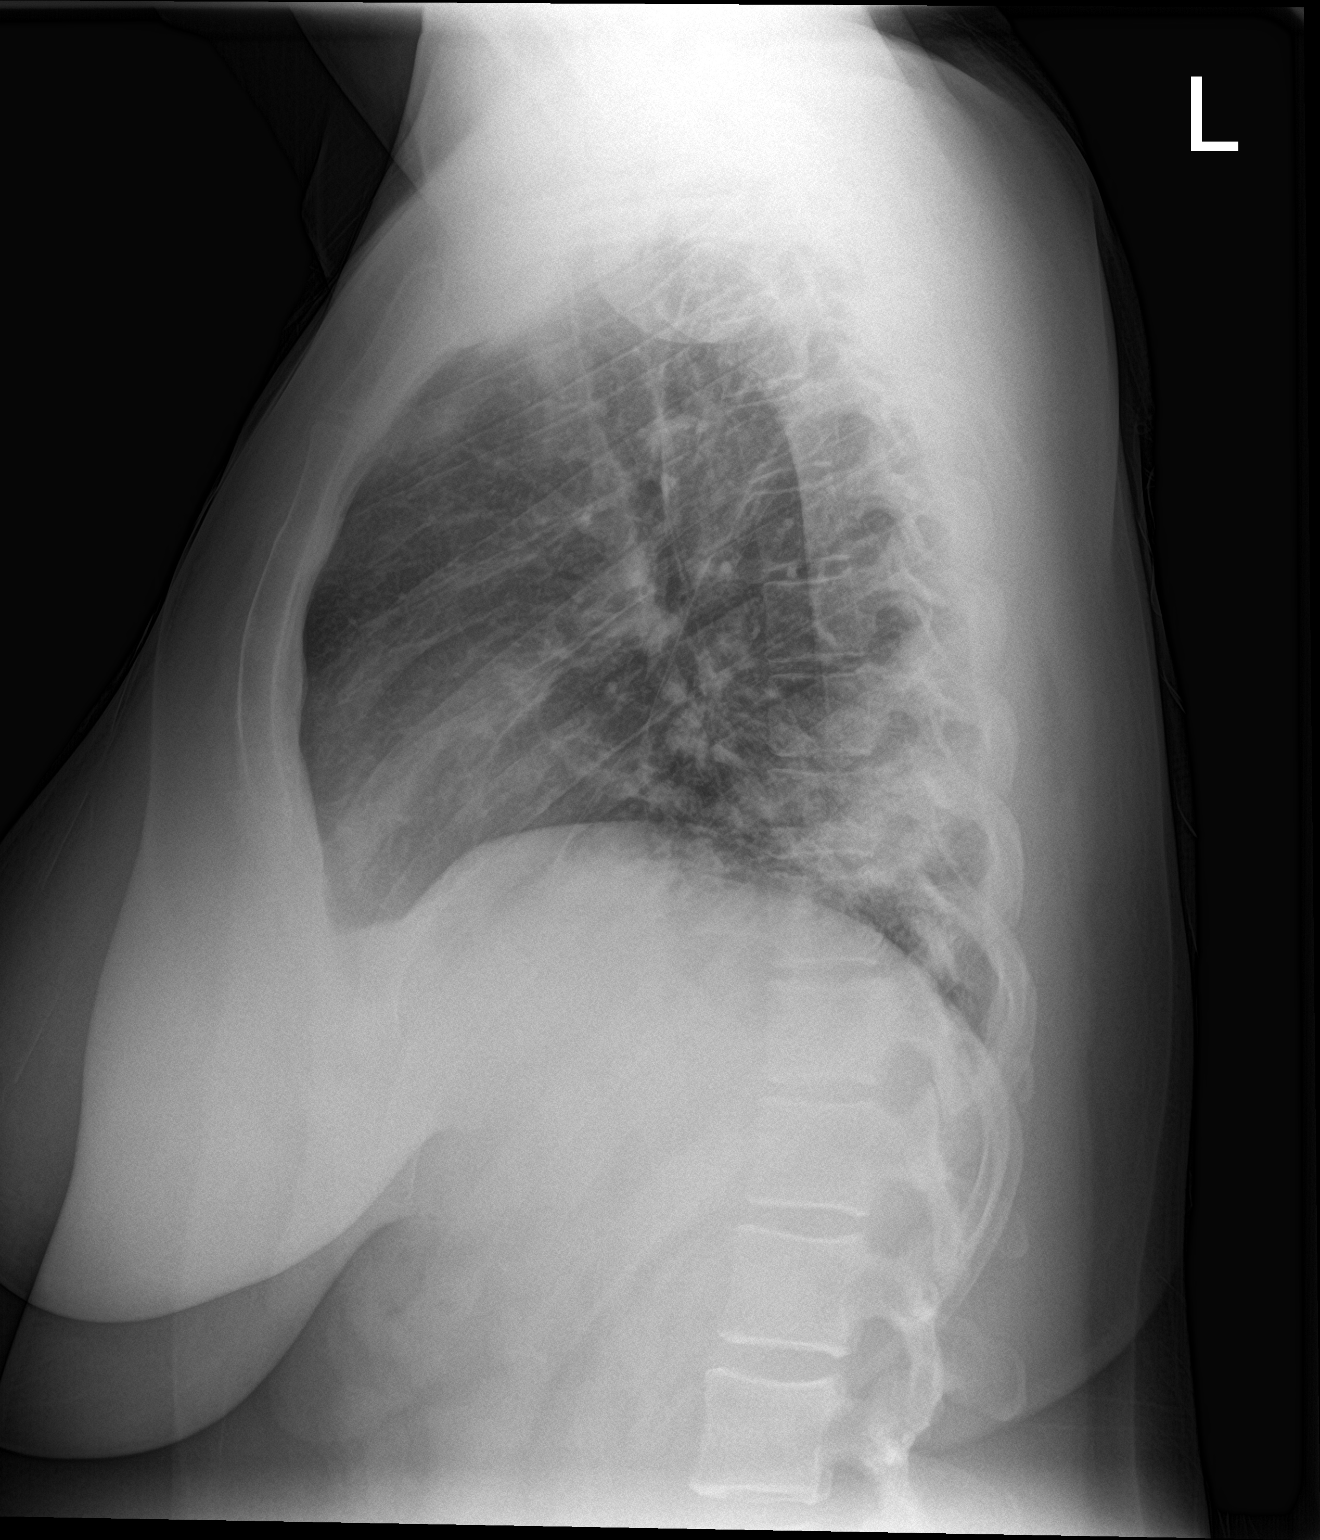

[2 of 2 positions shown; findings below may reference images not displayed]

FINDINGS: There are lower lung volumes. The heart size and mediastinal
contours are stable without evidence of adenopathy. There are new
airspace opacities in both lower lobes which are suspicious for
pneumonia. Pattern is nonspecific, and not typical of viral
infection. No edema, pleural effusion or pneumothorax. The bones
appear unchanged.
IMPRESSION: New bilateral lower lobe airspace opacities consistent with
pneumonia.

## 2021-03-11 DIAGNOSIS — N898 Other specified noninflammatory disorders of vagina: Secondary | ICD-10-CM | POA: Diagnosis not present

## 2021-03-11 DIAGNOSIS — R8290 Unspecified abnormal findings in urine: Secondary | ICD-10-CM | POA: Diagnosis not present

## 2021-03-11 DIAGNOSIS — R82998 Other abnormal findings in urine: Secondary | ICD-10-CM | POA: Diagnosis not present

## 2021-03-25 DIAGNOSIS — G5603 Carpal tunnel syndrome, bilateral upper limbs: Secondary | ICD-10-CM | POA: Diagnosis not present

## 2021-06-17 DIAGNOSIS — Z6828 Body mass index (BMI) 28.0-28.9, adult: Secondary | ICD-10-CM | POA: Diagnosis not present

## 2021-06-17 DIAGNOSIS — Z01419 Encounter for gynecological examination (general) (routine) without abnormal findings: Secondary | ICD-10-CM | POA: Diagnosis not present

## 2021-07-12 ENCOUNTER — Emergency Department (INDEPENDENT_AMBULATORY_CARE_PROVIDER_SITE_OTHER)
Admission: EM | Admit: 2021-07-12 | Discharge: 2021-07-12 | Disposition: A | Discharge status: Home / Self Care | Payer: BC Managed Care – PPO | Source: Home / Self Care | Attending: Family Medicine | Admitting: Family Medicine

## 2021-07-12 ENCOUNTER — Encounter: Payer: Self-pay | Admitting: Emergency Medicine

## 2021-07-12 ENCOUNTER — Other Ambulatory Visit: Payer: Self-pay

## 2021-07-12 DIAGNOSIS — J111 Influenza due to unidentified influenza virus with other respiratory manifestations: Secondary | ICD-10-CM | POA: Diagnosis not present

## 2021-07-12 LAB — POCT INFLUENZA A/B
Influenza A, POC: NEGATIVE
Influenza B, POC: POSITIVE — AB

## 2021-07-12 LAB — POC SARS CORONAVIRUS 2 AG -  ED: SARS Coronavirus 2 Ag: NEGATIVE

## 2021-07-12 NOTE — ED Provider Notes (Signed)
[facility]    CSN: [id number] Arrival date & time: 07/12/21  0854      History   Chief Complaint Chief Complaint  Patient presents with   Generalized Body Aches    HPI [name] is a 40 y.o. female.   HPI Patient has had flu symptoms and body aches for 6 days.  She is coughing up a lot of green mucus.  She feels very tired.  Very achy.  Very little sore throat, head congestion, sinus drainage.  Unknown exposure to flu or COVID.  Patient is not vaccinated for flu or COVID.  No one else at home is sick Past Medical History:  Diagnosis Date   Anemia    Iron deficiency anemia 11/05/2014   Menometrorrhagia 11/05/2014   Migraine    Viral meningitis     Patient Active Problem List   Diagnosis Date Noted   Chronic migraine without aura, with intractable migraine, so stated, with status migrainosus 07/22/2019   Other allergic rhinitis 11/19/2018   Seasonal allergic conjunctivitis 11/19/2018   Rash and other nonspecific skin eruption 11/19/2018   Penicillin allergy 11/19/2018   Iron deficiency anemia 11/05/2014   Menometrorrhagia 11/05/2014   Migraine with status migrainosus 10/14/2014   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 Authorizing Provider  albuterol (VENTOLIN HFA) 108 (90 Base) MCG/ACT inhaler Inhale 1-2 puffs into the lungs every 6 (six) hours as needed for wheezing or shortness of breath. 11/05/19    Lurene Shadow, PA-C  fexofenadine (ALLEGRA ALLERGY) 180 MG tablet Take 1 tablet (180 mg total) by mouth 2 (two) times a day. 11/23/18   Ellamae Sia, DO  ibuprofen (ADVIL,MOTRIN) 200 MG tablet Take 800 mg by mouth every 6 (six) hours as needed for moderate pain.    [provider]  meloxicam (MOBIC) 15 MG tablet Take 15 mg by mouth daily as needed.  07/16/19   [provider]  rizatriptan (MAXALT-MLT) 10 MG disintegrating tablet DISSOLVE 1 TABLET BY MOUTH AS NEEDED FOR MIGRAINE. MAY REPEAT IN 2 HOURS IF NEEDED 08/11/20   [name], MD  SPRINTEC 28 0.25-35 MG-MCG tablet Take 1 tablet by mouth daily. 07/10/19   [provider]  valACYclovir (VALTREX) 500 MG tablet Take 1 tablet by mouth 2 (two) times daily. 07/12/19   [provider]    Family History Family History  Problem Relation Age of Onset   Allergic rhinitis Mother    Cirrhosis Father    Alcoholism Father    Breast cancer Maternal Grandmother        great-grandmother   Breast cancer Cousin  Diabetes Paternal Grandfather    Allergic rhinitis Maternal Aunt    Migraines Neg Hx     Social History Social History   Tobacco Use   Smoking status: Never   Smokeless tobacco: Never   Tobacco comments:    NEVER USED TOBACCO  Vaping Use   Vaping Use: Never used  Substance Use Topics   Alcohol use: Yes    Alcohol/week: 0.0 standard drinks    Comment: Very rare   Drug use: No     Allergies   Citric acid, Latex, and Penicillins cross reactors   Review of Systems Review of Systems See HPI  Physical Exam Triage Vital Signs ED Triage Vitals  Enc Vitals Group     BP 07/12/21 0907 (!) 137/94     Pulse Rate 07/12/21 0907 76     Resp 07/12/21 0907 18     Temp 07/12/21 0907 98.9 F (37.2 C)     Temp Source 07/12/21 0907 Oral     SpO2 07/12/21 0907 92 %     Weight 07/12/21 0909 183 lb (83 kg)     Height --      Head Circumference --      Peak Flow --      Pain Score 07/12/21 0909 8      Pain Loc --      Pain Edu? --      Excl. in [place]? --    No data found.  Updated Vital Signs BP (!) 137/94 (BP Location: Right Arm)   Pulse 76   Temp 98.9 F (37.2 C) (Oral)   Resp 18   Wt 83 kg   LMP 06/14/2021 (Approximate)   SpO2 92%   BMI 28.66 kg/m      Physical Exam Constitutional:      General: She is not in acute distress.    Appearance: She is well-developed. She is ill-appearing.     Comments: Appears tired.  HENT:     Head: Normocephalic and atraumatic.     Right Ear: Tympanic membrane normal.     Left Ear: Tympanic membrane and ear canal normal.     Nose: Nose normal. No rhinorrhea.     Mouth/Throat:     Pharynx: No posterior oropharyngeal erythema.  Eyes:     Conjunctiva/sclera: Conjunctivae normal.     Pupils: Pupils are equal, round, and reactive to light.  Cardiovascular:     Rate and Rhythm: Normal rate and regular rhythm.     Heart sounds: Normal heart sounds.  Pulmonary:     Effort: Pulmonary effort is normal. No respiratory distress.     Breath sounds: Rhonchi present.     Comments: Few scattered rhonchi no rales Abdominal:     General: There is no distension.     Palpations: Abdomen is soft.  Musculoskeletal:        General: Normal range of motion.     Cervical back: Normal range of motion.  Lymphadenopathy:     Cervical: Cervical adenopathy present.  Skin:    General: Skin is warm and dry.     Findings: No rash.  Neurological:     General: No focal deficit present.     Mental Status: She is alert.  Psychiatric:        Mood and Affect: Mood normal.        Behavior: Behavior normal.     UC Treatments / Results  Labs (all labs ordered are listed, but only abnormal results are displayed) Labs  Reviewed  POCT INFLUENZA A/B - Abnormal; Notable for the following components:      Result Value   Influenza B, POC Positive (*)    All other components within normal limits  POC SARS CORONAVIRUS 2 AG -  ED - Normal     EKG   Radiology No results found.  Procedures Procedures (including critical care time)  Medications Ordered in UC Medications - No data to display  Initial Impression / Assessment and Plan / UC Course  I have reviewed the triage vital signs and the nursing notes.  Pertinent labs & imaging results that were available during my care of the patient were reviewed by me and considered in my medical decision making (see chart for details).     Influenza B test is positive.  Discussed additional conservative care at home, relieving symptoms.  Antibiotics not indicated Final Clinical Impressions(s) / UC Diagnoses   Final diagnoses:  Influenza     Discharge Instructions      You had influenza This is a virus.  Antibiotics will not help to get better sooner Continue to drink lots of fluids and get plenty of rest You may return to activity when your symptoms improve and you have no fever for 24 hours Call for problems     ED Prescriptions   None    PDMP not reviewed this encounter.   Raylene Everts, MD 07/12/21 [phone]

## 2021-07-12 NOTE — ED Triage Notes (Signed)
Pt c/o generalized body aches and pain x6 days. States she is coughing up a lot of green mucous. Taking tylenol.

## 2021-07-12 NOTE — Discharge Instructions (Signed)
You had influenza This is a virus.  Antibiotics will not help to get better sooner Continue to drink lots of fluids and get plenty of rest You may return to activity when your symptoms improve and you have no fever for 24 hours Call for problems

## 2021-07-14 ENCOUNTER — Telehealth: Payer: Self-pay | Admitting: *Deleted

## 2021-07-14 NOTE — Telephone Encounter (Signed)
Pt called requesting her work to be extended. She still reports fever and body aches from her flu dx 07/12/21. Per Wess Botts PA okay to extend to note to Monday since pt is off on the weekend. Note left upfront. Pt's husband, Amedeo Gory,  to pick up note.

## 2021-08-16 ENCOUNTER — Encounter: Payer: Self-pay | Admitting: Neurology

## 2021-08-16 ENCOUNTER — Telehealth: Payer: Self-pay | Admitting: *Deleted

## 2021-08-16 ENCOUNTER — Telehealth: Payer: Self-pay | Admitting: Neurology

## 2021-08-16 ENCOUNTER — Telehealth (INDEPENDENT_AMBULATORY_CARE_PROVIDER_SITE_OTHER): Payer: BC Managed Care – PPO | Admitting: Neurology

## 2021-08-16 DIAGNOSIS — G43109 Migraine with aura, not intractable, without status migrainosus: Secondary | ICD-10-CM

## 2021-08-16 DIAGNOSIS — G43711 Chronic migraine without aura, intractable, with status migrainosus: Secondary | ICD-10-CM | POA: Diagnosis not present

## 2021-08-16 MED ORDER — RIZATRIPTAN BENZOATE 10 MG PO TBDP: ORAL_TABLET | ORAL | 11 refills | Status: DC

## 2021-08-16 MED ORDER — AJOVY 225 MG/1.5ML ~~LOC~~ SOAJ: 225.0000 mg | SUBCUTANEOUS | 0 refills | Status: DC

## 2021-08-16 MED ORDER — ONDANSETRON 4 MG PO TBDP
4.0000 mg | ORAL_TABLET | Freq: Three times a day (TID) | ORAL | 11 refills | Status: DC | PRN
Start: 2021-08-16 — End: 2022-08-22

## 2021-08-16 MED ORDER — UBRELVY 100 MG PO TABS: 100.0000 mg | ORAL_TABLET | ORAL | 11 refills | Status: DC | PRN

## 2021-08-16 MED ORDER — AJOVY 225 MG/1.5ML ~~LOC~~ SOAJ: 225.0000 mg | SUBCUTANEOUS | 11 refills | Status: DC

## 2021-08-16 NOTE — Progress Notes (Signed)
[facility]    Provider:  Dr Lucia Gaskins Referring Provider: Georgianne Fick, MD Primary Care Physician:  Georgianne Fick, MD  CC:  Recurrent meningitis and migraines  Virtual Visit via Video Note  I connected with [name] on 08/16/21 at  2:00 PM EST by a video enabled telemedicine application and verified that I am speaking with the correct person using two identifiers.  Location: Patient: home Provider: pffice   I discussed the limitations of evaluation and management by telemedicine and the availability of in person appointments. The patient expressed understanding and agreed to proceed.   Follow Up Instructions:    I discussed the assessment and treatment plan with the patient. The patient was provided an opportunity to ask questions and all were answered. The patient agreed with the plan and demonstrated an understanding of the instructions.   The patient was advised to call back or seek an in-person evaluation if the symptoms worsen or if the condition fails to improve as anticipated.  I provided 20 minutes of non-face-to-face time during this encounter.   [name], MD   She was doing great on the Ajovy.  Unfortunately she had not been seen for 2 years and she needed a new appointment.  She has chronic migraines without aura.  She has episodic migraines with aura.    Her chronic migraines with aura have been ongoing for years, especially in the last 4 to 6 months, she has at least 15 migraine days a month that are moderate to severe, pulsating pounding throbbing, unilateral, nausea, light and sound sensitivity, did great on Ajovy, will prescribe it for her again.  She also has episodic migraines without aura, only about 4 a month, this would qualify for episodic migraines without aura and maybe we can get her Bernita Raisin for this.  Continue plan, we discussed other options, she was perfectly happy on these prescriptions however she had  not been seen in 2 years and we had to see her, I told her to go up and get a new co-pay card, we will leave her 2 samples at the front just in case we have to do a PA.  HPI 07/22/2019 [name] is a 41 y.o. female here as a referral from Dr. Nicholos Johns for recurent meningitis and migraines. She has a long history of migraines.  She had a very bad episode and went to the emergency room, she went to the ED, they did not perform an LP, stiff neck, headache, nausea, couldn't move her neck, she had symptoms of meningitis. She had chills, possibly fevers, did not get an MRI either.  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 She has daily headaches. She also has migraines with nausea, vomiting, unilateral, pulsating/pounding/throbbing/light and sound sensitivity, some sharp and shooting pains, no regular aura (occasional aura light and circles), no medication overuse,  Migraines last 24-72 hours and are moderately severe to severe, 10-15 migraine days a month, ongoing for over a year. No vision changes, no numbness or tingling, she has light/smell.sound sensitivity, a dark room helps, benadryl helps a little. She is not having anymore children.   Tried: flexeril, maxalt, topamax, nortriptyline, metoprolol, imitrex  Personally reviewed MRI brain images: Enhancement of the sulci, empty sella, needs repeat imaging  Cbc/cmp unremarkable  Reviewed ED notes: was seen for headache and neck pain x 2 days, nausea, vomitig,exam non focal,  No neurologic deficits, IV  meds provided for headache with reduction in pain, it was discussed with neurohospitalist who changed Valtrex dosing.  HPI 10/14/2014:  [name] is a 41 y.o. female here as a referral from Dr. Nicholos Johns for recurent meningitis and migraines.   She has had viral meningitis 5x since 2003, headache, stiff neck, feeling sick, goes away in 5 days approximately.  Now taking anti-viral daily. She is on the Valtrex every day twice daily 500mg  twice daily. urring. 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 She has seen multiple infectious disease doctors. She has resultant migraines. Her last episode of meningitis was February of last year. She has resultant headaches. She has headaches that last for weeks at a time. She lives with the pain but sometimes she goes and gets the migraine cocktail. At home she takes excedrin migraine if really needed, she tries not to take it every day. The headache is on one side behind the ear, pulsating throbbing pain, +nausea, +light sensitivity, has blurry vision changes, worsening in severity and frequency, can be 10/10 and misses work. She is getting auras. She uses maxalt for acute management. She gets them 5 days a week. She has tried topamax. Never tried depakote. She is not taking her metoprolol daily, the nurse at the doctor's office prescribed it and so did her doctor. She tried it for a week. Migraines ha been occurring for years. She is not sleeping well at night. She has two children, she tries to go to bed by 10 but she gets up at 5:30 to 6am in the morning. She doesn't sleep like she used to. She drinks a lot of caffeine. Coffee, tea, pepsi, all day. Not breast feeding. No plans for more children currently, using BC.   Reviewed notes, labs and imaging from outside physicians, which showed:  CSF 09/24/14 HSV neg WBC 1 (190 one year ago) Protein 15 (51 a yar ago) Glucose nml  Ct 09/2013 showed No acute intracranial abnormalities including mass lesion or mass effect, hydrocephalus, extra-axial fluid collection, midline shift, hemorrhage, or acute infarction, large ischemic events (personally reviewed images)  Notes from hospitalization in 09/2013 states patient admitted for headache, back pain, neck stiffness and severe headache with csf suggestive of aseptic meningitis, febrile to 102 and LP with 190 WBCs lymphs no organismas. In  February this yea she was seen in the ED with head, neck and back pain, fevers. LP did not show WBCs so appears patient was sent home with pcp follow up.  She was seen in the ED in 2013 with migraines, 10/10 pain right side of the head with photo/phonophobia and nausea. Was given migraine cocktail with IV fluids and felt better.  Review of Systems: Patient complains of symptoms per HPI as well as the following symptoms: migraine . Pertinent negatives and positives per HPI. All others negative   Social History   Socioeconomic History   Marital status: Married    Spouse name: Darren   Number of children: 2   Years of education: 12   Highest education level: Not on file  Occupational History   Occupation: 2014  Tobacco Use   Smoking status: Never   Smokeless tobacco: Never   Tobacco comments:    NEVER USED TOBACCO  Vaping Use   Vaping Use: Never used  Substance and Sexual Activity   Alcohol use: Yes    Alcohol/week: 0.0 standard drinks    Comment: Very rare   Drug use: No   Sexual activity: Yes  Birth control/protection: Pill  Other Topics Concern   Not on file  Social History Narrative   Lives at home husband and 2 kids.   Right handed.   Caffeine use: sometimes a cup of a soda/day, may or may not be caffeinated. Coffee: 2-3 times per week   Social Determinants of Health   Financial Resource Strain: Not on file  Food Insecurity: Not on file  Transportation Needs: Not on file  Physical Activity: Not on file  Stress: Not on file  Social Connections: Not on file  Intimate Partner Violence: Not on file    Family History  Problem Relation Age of Onset   Allergic rhinitis Mother    Cirrhosis Father    Alcoholism Father    Breast cancer Maternal Grandmother        great-grandmother   Breast cancer Cousin    Diabetes Paternal Grandfather    Allergic rhinitis Maternal Aunt    Migraines Neg Hx     Past Medical History:  Diagnosis Date   Anemia     Iron deficiency anemia 11/05/2014   Menometrorrhagia 11/05/2014   Migraine    Viral meningitis     Past Surgical History:  Procedure Laterality Date   CESAREAN SECTION N/A 06/15/2013   Procedure: CESAREAN SECTION;  Surgeon: Philip AspenSidney Callahan, DO;  Location: [facility];  Service: Obstetrics;  Laterality: N/A;   FETAL BLOOD TRANSFUSION  2014   With C-section    Current Outpatient Medications  Medication Sig Dispense Refill   Fremanezumab-vfrm (AJOVY) 225 MG/1.5ML SOAJ Inject 225 mg into the skin every 30 (thirty) days. 1.5 mL 11   Fremanezumab-vfrm (AJOVY) 225 MG/1.5ML SOAJ Inject 225 mg into the skin every 30 (thirty) days. 3 mL 0   ondansetron (ZOFRAN-ODT) 4 MG disintegrating tablet Take 1-2 tablets (4-8 mg total) by mouth every 8 (eight) hours as needed. 30 tablet 11   Ubrogepant (UBRELVY) 100 MG TABS Take 100 mg by mouth every 2 (two) hours as needed. Maximum 200mg  a day. 16 tablet 11   albuterol (VENTOLIN HFA) 108 (90 Base) MCG/ACT inhaler Inhale 1-2 puffs into the lungs every 6 (six) hours as needed for wheezing or shortness of breath. 8 g 0   fexofenadine (ALLEGRA ALLERGY) 180 MG tablet Take 1 tablet (180 mg total) by mouth 2 (two) times a day. 60 tablet 5   ibuprofen (ADVIL,MOTRIN) 200 MG tablet Take 800 mg by mouth every 6 (six) hours as needed for moderate pain.     meloxicam (MOBIC) 15 MG tablet Take 15 mg by mouth daily as needed.      rizatriptan (MAXALT-MLT) 10 MG disintegrating tablet DISSOLVE 1 TABLET BY MOUTH AS NEEDED FOR MIGRAINE. MAY REPEAT IN 2 HOURS IF NEEDED 9 tablet 11   SPRINTEC 28 0.25-35 MG-MCG tablet Take 1 tablet by mouth daily.     valACYclovir (VALTREX) 500 MG tablet Take 1 tablet by mouth 2 (two) times daily.     No current facility-administered medications for this visit.    Allergies as of 08/16/2021 - Review Complete 07/12/2021  Allergen Reaction Noted   Citric acid  05/23/2011   Latex Rash 06/14/2013   Penicillins cross reactors Hives and Rash 05/23/2011     Vitals: There were no vitals taken for this visit. Last Weight:  Wt Readings from Last 1 Encounters:  07/12/21 183 lb (83 kg)   Last Height:   Ht Readings from Last 1 Encounters:  11/05/19 5\' 7"  (1.702 m)    Physical exam: Exam: Gen:  NAD, conversant      CV:  Denies palpitations or chest pain or SOB. VS: Breathing at a normal rate. Weight appears within normal limits. Not febrile. Eyes: Conjunctivae clear without exudates or hemorrhage  Neuro: Detailed Neurologic Exam  Speech:    Speech is normal; fluent and spontaneous with normal comprehension.  Cognition:    The patient is oriented to person, place, and time;     recent and remote memory intact;     language fluent;     normal attention, concentration,     fund of knowledge Cranial Nerves:    The pupils are equal, round, and reactive to light. Cannot perform fundoscopic exam. Visual fields are full to finger confrontation. Extraocular movements are intact.  The face is symmetric with normal sensation. The palate elevates in the midline. Hearing intact. Voice is normal. Shoulder shrug is normal. The tongue has normal motion without fasciculations.   Coordination:    Normal finger to nose  Gait:    Normal native gait  Motor Observation:   no involuntary movements noted. Tone:    Appears normal  Posture:    Posture is normal. normal erect    Strength:    Strength is anti-gravity and symmetric in the upper and lower limbs.      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  Continue daily valtrex. She has tried and failed multiple medicationss: Tried: flexeril, maxalt, topamax, nortriptyline, metoprolol  She was doing great on the Ajovy.  Unfortunately she had not been seen for 2 years and she needed a new appointment.  She has chronic migraines without aura.  She has episodic migraines with  aura.    Her chronic migraines with aura have been ongoing for years, especially in the last 4 to 6 months, she has at least 15 migraine days a month that are moderate to severe, pulsating pounding throbbing, unilateral, nausea, light and sound sensitivity, did great on Ajovy, will prescribe it for her again.  She also has episodic migraines without aura, only about 4 a month, this would qualify for episodic migraines without aura and maybe we can get her Bernita Raisin for this.  No orders of the defined types were placed in this encounter.  Meds ordered this encounter  Medications   Fremanezumab-vfrm (AJOVY) 225 MG/1.5ML SOAJ    Sig: Inject 225 mg into the skin every 30 (thirty) days.    Dispense:  1.5 mL    Refill:  11    Chronic migraine without aura   Ubrogepant (UBRELVY) 100 MG TABS    Sig: Take 100 mg by mouth every 2 (two) hours as needed. Maximum 200mg  a day.    Dispense:  16 tablet    Refill:  11    Failed maxalt, imitrex, episodic migraine with aura 4-14 a month.   rizatriptan (MAXALT-MLT) 10 MG disintegrating tablet    Sig: DISSOLVE 1 TABLET BY MOUTH AS NEEDED FOR MIGRAINE. MAY REPEAT IN 2 HOURS IF NEEDED    Dispense:  9 tablet    Refill:  11    appointment needed for further refills.   ondansetron (ZOFRAN-ODT) 4 MG disintegrating tablet    Sig: Take 1-2 tablets (4-8 mg total) by mouth every 8 (eight) hours as needed.    Dispense:  30 tablet    Refill:  11   Fremanezumab-vfrm (AJOVY) 225 MG/1.5ML  SOAJ    Sig: Inject 225 mg into the skin every 30 (thirty) days.    Dispense:  3 mL    Refill:  0      Anita DeanAntonia Phillippe Orlick, MD  [facility] [address] [place], [place] [id number]  Phone [phone] Fax [phone]

## 2021-08-16 NOTE — Telephone Encounter (Signed)
Please call patient and schedule a year follow up video dr Lucia Gaskins

## 2021-08-16 NOTE — Telephone Encounter (Signed)
Per Dr. Lucia Gaskins give patients 2 samples of  Ajovy. Pt will pick up. Samples pulled and  ready for pick up  in refrigerator .

## 2021-08-17 ENCOUNTER — Telehealth: Payer: Self-pay | Admitting: *Deleted

## 2021-08-17 ENCOUNTER — Encounter: Payer: Self-pay | Admitting: Neurology

## 2021-08-17 NOTE — Telephone Encounter (Signed)
I spoke with the pharmacy and obtained pharmacy benefit information for the PA.  BIN: [id number], GROUP: [id number], ID: [id number].  Bernita Raisin PA completed on Cover My Meds. Key: [id number]. Awaiting determination from [employer]. Requested urgent review.

## 2021-08-17 NOTE — Telephone Encounter (Signed)
Approved today Effective from 08/17/2021 through 11/08/2021.

## 2021-08-17 NOTE — Telephone Encounter (Signed)
Completed Ajovy PA on Cover My Meds. [id number]. Awaiting determination from [employer].

## 2021-08-23 NOTE — Telephone Encounter (Signed)
Patient has not picked up samples yet.  I called the pharmacy and they are going to order the Ajovy and it should be ready tomorrow.  Patient will be notified when it is ready for pickup.

## 2021-08-30 DIAGNOSIS — M2550 Pain in unspecified joint: Secondary | ICD-10-CM | POA: Diagnosis not present

## 2021-08-30 NOTE — Addendum Note (Signed)
Addended by: Gildardo Griffes on: 08/30/2021 03:15 PM   Modules accepted: Orders

## 2021-08-30 NOTE — Telephone Encounter (Signed)
Sample order canceled since samples were not picked up. The pharmacy was to have filled these for the pt 1/16-1/17/23.

## 2021-09-09 DIAGNOSIS — G4700 Insomnia, unspecified: Secondary | ICD-10-CM | POA: Diagnosis not present

## 2021-09-09 DIAGNOSIS — M791 Myalgia, unspecified site: Secondary | ICD-10-CM | POA: Diagnosis not present

## 2021-09-09 DIAGNOSIS — M25561 Pain in right knee: Secondary | ICD-10-CM | POA: Diagnosis not present

## 2021-09-09 DIAGNOSIS — M79642 Pain in left hand: Secondary | ICD-10-CM | POA: Diagnosis not present

## 2021-09-09 DIAGNOSIS — M1990 Unspecified osteoarthritis, unspecified site: Secondary | ICD-10-CM | POA: Diagnosis not present

## 2021-09-09 DIAGNOSIS — M25562 Pain in left knee: Secondary | ICD-10-CM | POA: Diagnosis not present

## 2021-09-09 DIAGNOSIS — R5383 Other fatigue: Secondary | ICD-10-CM | POA: Diagnosis not present

## 2021-09-09 DIAGNOSIS — M79641 Pain in right hand: Secondary | ICD-10-CM | POA: Diagnosis not present

## 2021-09-16 DIAGNOSIS — M791 Myalgia, unspecified site: Secondary | ICD-10-CM | POA: Diagnosis not present

## 2021-09-16 DIAGNOSIS — R5383 Other fatigue: Secondary | ICD-10-CM | POA: Diagnosis not present

## 2021-09-16 DIAGNOSIS — M329 Systemic lupus erythematosus, unspecified: Secondary | ICD-10-CM | POA: Diagnosis not present

## 2021-09-16 DIAGNOSIS — M1990 Unspecified osteoarthritis, unspecified site: Secondary | ICD-10-CM | POA: Diagnosis not present

## 2021-10-14 DIAGNOSIS — M791 Myalgia, unspecified site: Secondary | ICD-10-CM | POA: Diagnosis not present

## 2021-10-14 DIAGNOSIS — M329 Systemic lupus erythematosus, unspecified: Secondary | ICD-10-CM | POA: Diagnosis not present

## 2021-10-14 DIAGNOSIS — R5383 Other fatigue: Secondary | ICD-10-CM | POA: Diagnosis not present

## 2021-10-14 DIAGNOSIS — M1990 Unspecified osteoarthritis, unspecified site: Secondary | ICD-10-CM | POA: Diagnosis not present

## 2022-01-31 DIAGNOSIS — M791 Myalgia, unspecified site: Secondary | ICD-10-CM | POA: Diagnosis not present

## 2022-01-31 DIAGNOSIS — M329 Systemic lupus erythematosus, unspecified: Secondary | ICD-10-CM | POA: Diagnosis not present

## 2022-01-31 DIAGNOSIS — R5383 Other fatigue: Secondary | ICD-10-CM | POA: Diagnosis not present

## 2022-01-31 DIAGNOSIS — M1990 Unspecified osteoarthritis, unspecified site: Secondary | ICD-10-CM | POA: Diagnosis not present

## 2022-02-10 DIAGNOSIS — R071 Chest pain on breathing: Secondary | ICD-10-CM | POA: Diagnosis not present

## 2022-02-15 DIAGNOSIS — G039 Meningitis, unspecified: Secondary | ICD-10-CM | POA: Diagnosis not present

## 2022-02-15 DIAGNOSIS — R918 Other nonspecific abnormal finding of lung field: Secondary | ICD-10-CM | POA: Diagnosis not present

## 2022-02-15 DIAGNOSIS — R519 Headache, unspecified: Secondary | ICD-10-CM | POA: Diagnosis not present

## 2022-02-15 DIAGNOSIS — R509 Fever, unspecified: Secondary | ICD-10-CM | POA: Diagnosis not present

## 2022-02-15 DIAGNOSIS — K760 Fatty (change of) liver, not elsewhere classified: Secondary | ICD-10-CM | POA: Diagnosis not present

## 2022-02-15 DIAGNOSIS — R839 Unspecified abnormal finding in cerebrospinal fluid: Secondary | ICD-10-CM | POA: Diagnosis not present

## 2022-02-15 DIAGNOSIS — J9811 Atelectasis: Secondary | ICD-10-CM | POA: Diagnosis not present

## 2022-02-15 DIAGNOSIS — Z3202 Encounter for pregnancy test, result negative: Secondary | ICD-10-CM | POA: Diagnosis not present

## 2022-02-15 DIAGNOSIS — G43909 Migraine, unspecified, not intractable, without status migrainosus: Secondary | ICD-10-CM | POA: Diagnosis not present

## 2022-02-15 DIAGNOSIS — E876 Hypokalemia: Secondary | ICD-10-CM | POA: Diagnosis not present

## 2022-02-15 DIAGNOSIS — J90 Pleural effusion, not elsewhere classified: Secondary | ICD-10-CM | POA: Diagnosis not present

## 2022-02-15 DIAGNOSIS — M436 Torticollis: Secondary | ICD-10-CM | POA: Diagnosis not present

## 2022-02-15 DIAGNOSIS — A879 Viral meningitis, unspecified: Secondary | ICD-10-CM | POA: Diagnosis not present

## 2022-02-15 DIAGNOSIS — Z8661 Personal history of infections of the central nervous system: Secondary | ICD-10-CM | POA: Diagnosis not present

## 2022-02-15 DIAGNOSIS — M542 Cervicalgia: Secondary | ICD-10-CM | POA: Diagnosis not present

## 2022-02-15 DIAGNOSIS — J181 Lobar pneumonia, unspecified organism: Secondary | ICD-10-CM | POA: Diagnosis not present

## 2022-02-15 DIAGNOSIS — G032 Benign recurrent meningitis [Mollaret]: Secondary | ICD-10-CM | POA: Diagnosis not present

## 2022-02-15 DIAGNOSIS — J159 Unspecified bacterial pneumonia: Secondary | ICD-10-CM | POA: Diagnosis not present

## 2022-02-15 DIAGNOSIS — Z791 Long term (current) use of non-steroidal anti-inflammatories (NSAID): Secondary | ICD-10-CM | POA: Diagnosis not present

## 2022-02-24 DIAGNOSIS — R110 Nausea: Secondary | ICD-10-CM | POA: Diagnosis not present

## 2022-02-24 DIAGNOSIS — R058 Other specified cough: Secondary | ICD-10-CM | POA: Diagnosis not present

## 2022-02-24 DIAGNOSIS — G9339 Other post infection and related fatigue syndromes: Secondary | ICD-10-CM | POA: Diagnosis not present

## 2022-02-24 DIAGNOSIS — R0602 Shortness of breath: Secondary | ICD-10-CM | POA: Diagnosis not present

## 2022-03-02 DIAGNOSIS — M329 Systemic lupus erythematosus, unspecified: Secondary | ICD-10-CM | POA: Diagnosis not present

## 2022-03-02 DIAGNOSIS — M791 Myalgia, unspecified site: Secondary | ICD-10-CM | POA: Diagnosis not present

## 2022-03-02 DIAGNOSIS — M1990 Unspecified osteoarthritis, unspecified site: Secondary | ICD-10-CM | POA: Diagnosis not present

## 2022-03-02 DIAGNOSIS — R5383 Other fatigue: Secondary | ICD-10-CM | POA: Diagnosis not present

## 2022-03-21 ENCOUNTER — Other Ambulatory Visit: Payer: Self-pay

## 2022-03-21 ENCOUNTER — Ambulatory Visit (INDEPENDENT_AMBULATORY_CARE_PROVIDER_SITE_OTHER): Payer: BC Managed Care – PPO | Admitting: Infectious Disease

## 2022-03-21 VITALS — BP 163/107 | HR 69 | Temp 97.2°F | Resp 16 | Wt 180.0 lb

## 2022-03-21 DIAGNOSIS — Z113 Encounter for screening for infections with a predominantly sexual mode of transmission: Secondary | ICD-10-CM | POA: Diagnosis not present

## 2022-03-21 DIAGNOSIS — G032 Benign recurrent meningitis [Mollaret]: Secondary | ICD-10-CM

## 2022-03-21 DIAGNOSIS — G43711 Chronic migraine without aura, intractable, with status migrainosus: Secondary | ICD-10-CM

## 2022-03-21 DIAGNOSIS — Z880 Allergy status to penicillin: Secondary | ICD-10-CM | POA: Diagnosis not present

## 2022-03-21 DIAGNOSIS — Z91040 Latex allergy status: Secondary | ICD-10-CM | POA: Insufficient documentation

## 2022-03-21 MED ORDER — FAMCICLOVIR 500 MG PO TABS: 500.0000 mg | ORAL_TABLET | Freq: Two times a day (BID) | ORAL | 11 refills | Status: DC

## 2022-03-21 NOTE — Progress Notes (Signed)
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   MRN: [id number]  HPI  [name] is a 41 year old Black woman with past medical history significant for migrainous headaches but also Mollaret's meningitis with multiple recurrences over the years who was recently hospitalized at [facility] for aseptic meningitis.  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  The last time she had such an episode was in 2020 and she was just treated with increased dose of Valtrex prior to that she had gone roughly 6 years without a flare of it while taking Valtrex 500 mg twice daily.  She is been diagnosed with connective tissue disease I believe lupus though I cannot read the referring physician's notes currently as they are printed in a "double vision print."'  My understanding is that [name] is being contemplated  Past Medical History:  Diagnosis Date   Anemia    Iron deficiency anemia 11/05/2014   Menometrorrhagia 11/05/2014   Migraine    Viral meningitis     Past Surgical History:  Procedure Laterality Date   CESAREAN SECTION N/A 06/15/2013   Procedure: CESAREAN SECTION;  Surgeon: Philip Aspen, DO;  Location: [facility];  Service: Obstetrics;  Laterality: N/A;   FETAL BLOOD TRANSFUSION  2014   With C-section    Family History  Problem Relation Age of Onset   Allergic rhinitis Mother    Cirrhosis Father    Alcoholism Father    Breast cancer Maternal Grandmother        great-grandmother   Breast cancer Cousin    Diabetes Paternal Grandfather    Allergic rhinitis Maternal Aunt    Migraines Neg Hx       Social History    Socioeconomic History   Marital status: Married    Spouse name: Darren   Number of children: 2   Years of education: 12   Highest education level: Not on file  Occupational History   Occupation: Doctor, hospital  Tobacco Use   Smoking status: Never   Smokeless tobacco: Never   Tobacco comments:    NEVER USED TOBACCO  Vaping Use   Vaping Use: Never used  Substance and Sexual Activity   Alcohol use: Yes    Alcohol/week: 0.0 standard drinks of alcohol    Comment: Very rare   Drug use: No   Sexual activity: Yes    Birth control/protection: Pill  Other Topics Concern   Not on file  Social History Narrative   Lives at home husband and 2 kids.   Right handed.   Caffeine use: sometimes a cup of a soda/day, may or may not be caffeinated. Coffee: 2-3 times per week   Social Determinants of Health   Financial Resource Strain: Not on file  Food Insecurity: Not on file  Transportation Needs: Not on file  Physical Activity: Not on file  Stress: Not on file  Social Connections: Not on file    Allergies  Allergen Reactions   Citric Acid     Burns inside of mouth   Latex Rash   Penicillins Cross Reactors Hives and Rash     Current Outpatient Medications:    fexofenadine (ALLEGRA  ALLERGY) 180 MG tablet, Take 1 tablet (180 mg total) by mouth 2 (two) times a day., Disp: 60 tablet, Rfl: 5   hydroxychloroquine (PLAQUENIL) 200 MG tablet, Take 200 mg by mouth daily., Disp: , Rfl:    predniSONE (DELTASONE) 10 MG tablet, Take by mouth., Disp: , Rfl:    SPRINTEC 28 0.25-35 MG-MCG tablet, Take 1 tablet by mouth daily., Disp: , Rfl:    valACYclovir (VALTREX) 500 MG tablet, Take 1 tablet by mouth 2 (two) times daily., Disp: , Rfl:    albuterol (VENTOLIN HFA) 108 (90 Base) MCG/ACT inhaler, Inhale 1-2 puffs into the lungs every 6 (six) hours as needed for wheezing or shortness of breath., Disp: 8 g, Rfl: 0   Fremanezumab-vfrm (AJOVY) 225 MG/1.5ML SOAJ, Inject 225 mg into the skin every  30 (thirty) days., Disp: 1.5 mL, Rfl: 11   ibuprofen (ADVIL,MOTRIN) 200 MG tablet, Take 800 mg by mouth every 6 (six) hours as needed for moderate pain., Disp: , Rfl:    meloxicam (MOBIC) 15 MG tablet, Take 15 mg by mouth daily as needed. , Disp: , Rfl:    ondansetron (ZOFRAN-ODT) 4 MG disintegrating tablet, Take 1-2 tablets (4-8 mg total) by mouth every 8 (eight) hours as needed., Disp: 30 tablet, Rfl: 11   rizatriptan (MAXALT-MLT) 10 MG disintegrating tablet, DISSOLVE 1 TABLET BY MOUTH AS NEEDED FOR MIGRAINE. MAY REPEAT IN 2 HOURS IF NEEDED, Disp: 9 tablet, Rfl: 11   Ubrogepant (UBRELVY) 100 MG TABS, Take 100 mg by mouth every 2 (two) hours as needed. Maximum 200mg  a day., Disp: 16 tablet, Rfl: 11   Review of Systems  Constitutional:  Negative for activity change, appetite change, chills, diaphoresis, fatigue, fever and unexpected weight change.  HENT:  Negative for congestion, rhinorrhea, sinus pressure, sneezing, sore throat and trouble swallowing.   Eyes:  Negative for photophobia and visual disturbance.  Respiratory:  Negative for cough, chest tightness, shortness of breath, wheezing and stridor.   Cardiovascular:  Negative for chest pain, palpitations and leg swelling.  Gastrointestinal:  Negative for abdominal distention, abdominal pain, anal bleeding, blood in stool, constipation, diarrhea, nausea and vomiting.  Genitourinary:  Negative for difficulty urinating, dysuria, flank pain and hematuria.  Musculoskeletal:  Negative for arthralgias, back pain, gait problem, joint swelling and myalgias.  Skin:  Negative for color change, pallor, rash and wound.  Neurological:  Negative for dizziness, tremors, weakness and light-headedness.  Hematological:  Negative for adenopathy. Does not bruise/bleed easily.  Psychiatric/Behavioral:  Negative for agitation, behavioral problems, confusion, decreased concentration, dysphoric mood and sleep disturbance.        Objective:   Physical  Exam Constitutional:      General: She is not in acute distress.    Appearance: Normal appearance. She is well-developed. She is not ill-appearing or diaphoretic.  HENT:     Head: Normocephalic and atraumatic.     Right Ear: Hearing and external ear normal.     Left Ear: Hearing and external ear normal.     Nose: No nasal deformity or rhinorrhea.  Eyes:     General: No scleral icterus.    Conjunctiva/sclera: Conjunctivae normal.     Right eye: Right conjunctiva is not injected.     Left eye: Left conjunctiva is not injected.     Pupils: Pupils are equal, round, and reactive to light.  Neck:     Vascular: No JVD.  Cardiovascular:     Rate and Rhythm: Normal rate and regular rhythm.  Heart sounds: S1 normal and S2 normal.  Pulmonary:     Effort: Pulmonary effort is normal. No respiratory distress.     Breath sounds: No wheezing.  Abdominal:     General: Bowel sounds are normal. There is no distension.     Palpations: Abdomen is soft.  Musculoskeletal:        General: Normal range of motion.     Right shoulder: Normal.     Left shoulder: Normal.     Cervical back: Normal range of motion and neck supple.     Right hip: Normal.     Left hip: Normal.     Right knee: Normal.     Left knee: Normal.  Lymphadenopathy:     Head:     Right side of head: No submandibular, preauricular or posterior auricular adenopathy.     Left side of head: No submandibular, preauricular or posterior auricular adenopathy.     Cervical: No cervical adenopathy.     Right cervical: No superficial or deep cervical adenopathy.    Left cervical: No superficial or deep cervical adenopathy.  Skin:    General: Skin is warm and dry.     Coloration: Skin is not pale.     Findings: No abrasion, bruising, ecchymosis, erythema, lesion or rash.     Nails: There is no clubbing.  Neurological:     General: No focal deficit present.     Mental Status: She is alert and oriented to person, place, and time.      Sensory: No sensory deficit.     Coordination: Coordination normal.     Gait: Gait normal.  Psychiatric:        Attention and Perception: She is attentive.        Mood and Affect: Mood normal.        Speech: Speech normal.        Behavior: Behavior normal. Behavior is cooperative.        Thought Content: Thought content normal.        Judgment: Judgment normal.           Assessment & Plan:   HSV 2 recurrent meningitis "Mollaret's"  There is actually controversy re whether or not antivirals make much of a difference in CNS infections but in her situation it sounds as if Valtrex has reduced the frequency of her CNS HSV infections  I will see if going with MORE bio-available such as Famvir at a dose of 500 mg twice daily is something that might be more efficacious at preventing her recurrences.  SLE/connective tissue disease:  When using prophylactic antivirals I do not see much other way of attenuating risk of immunosuppressive therapy at least HSV-2 meningitis is typically something that is mainly painful and not life threatening unlike HSV1 or 2 encephalitis  Screening for STI's:  Rescreen for chlamydia gonorrhea and HIV and all were negative.  I did discuss the idea of HIV preexposure prophylaxis.  She does not feel that she is at risk and her monogamous relationship with her husband.    I spent 81 minutes with the patient including than 50% of the time in face to face counseling of the patient already Mollaret's meningitis her connective tissue disease immunosuppressive therapy  along with review of medical records in preparation for the visit and during the visit and in coordination of her care.

## 2022-04-07 DIAGNOSIS — M329 Systemic lupus erythematosus, unspecified: Secondary | ICD-10-CM | POA: Diagnosis not present

## 2022-04-07 DIAGNOSIS — M791 Myalgia, unspecified site: Secondary | ICD-10-CM | POA: Diagnosis not present

## 2022-04-07 DIAGNOSIS — R5383 Other fatigue: Secondary | ICD-10-CM | POA: Diagnosis not present

## 2022-04-07 DIAGNOSIS — M1990 Unspecified osteoarthritis, unspecified site: Secondary | ICD-10-CM | POA: Diagnosis not present

## 2022-04-07 DIAGNOSIS — M609 Myositis, unspecified: Secondary | ICD-10-CM | POA: Diagnosis not present

## 2022-04-13 DIAGNOSIS — M329 Systemic lupus erythematosus, unspecified: Secondary | ICD-10-CM | POA: Diagnosis not present

## 2022-04-13 DIAGNOSIS — M609 Myositis, unspecified: Secondary | ICD-10-CM | POA: Diagnosis not present

## 2022-04-13 DIAGNOSIS — M1990 Unspecified osteoarthritis, unspecified site: Secondary | ICD-10-CM | POA: Diagnosis not present

## 2022-04-20 ENCOUNTER — Ambulatory Visit (INDEPENDENT_AMBULATORY_CARE_PROVIDER_SITE_OTHER): Payer: BC Managed Care – PPO | Admitting: Infectious Disease

## 2022-04-20 ENCOUNTER — Encounter: Payer: Self-pay | Admitting: Infectious Disease

## 2022-04-20 ENCOUNTER — Other Ambulatory Visit: Payer: Self-pay

## 2022-04-20 VITALS — BP 139/86 | HR 97 | Resp 16 | Ht 67.0 in | Wt 185.0 lb

## 2022-04-20 DIAGNOSIS — M351 Other overlap syndromes: Secondary | ICD-10-CM

## 2022-04-20 DIAGNOSIS — G032 Benign recurrent meningitis [Mollaret]: Secondary | ICD-10-CM | POA: Diagnosis not present

## 2022-04-20 HISTORY — DX: Other overlap syndromes: M35.1

## 2022-04-20 MED ORDER — FAMCICLOVIR 500 MG PO TABS: 500.0000 mg | ORAL_TABLET | Freq: Two times a day (BID) | ORAL | 11 refills | Status: AC

## 2022-04-20 NOTE — Progress Notes (Signed)
Subjective:  Chief complaint: Follow-up for molaret'smeningitis  Requesting physician: Haydee Salter, MD   Patient ID: [name], female    DOB: 1980/09/15, 41 y.o.   MRN: [id number]  HPI  [name] is a 41 year old Black woman with past medical history significant for migrainous headaches but also Mollaret's meningitis with multiple recurrences over the years who was recently hospitalized at [facility] for aseptic meningitis.  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  The last time she had such an episode was in 2020 and she was just treated with increased dose of Valtrex prior to that she had gone roughly 6 years without a flare of it while taking Valtrex 500 mg twice daily.  She is been diagnosed with connective tissue disease I believe lupus though I cannot read the referring physician's notes currently as they are printed in a "double vision print."'  My understanding is that [name] is being contemplated  We switched her to Famvir 500 mg twice daily which she is tolerating well.  She does have some dryness of the tips of her fingers which she thinks might be an adverse reaction to the [place] but otherwise has no problems with it at all.  She is following up with rheumatology with regards to initiation of Benlysta.  Past Medical History:  Diagnosis Date   Anemia    Iron deficiency anemia 11/05/2014   Menometrorrhagia 11/05/2014   Migraine    Viral meningitis     Past Surgical History:  Procedure Laterality Date   CESAREAN SECTION N/A 06/15/2013   Procedure: CESAREAN SECTION;  Surgeon: Philip Aspen, DO;  Location: [facility];  Service: Obstetrics;  Laterality: N/A;   FETAL BLOOD TRANSFUSION  2014   With C-section    Family History  Problem Relation Age of Onset   Allergic rhinitis Mother    Cirrhosis Father    Alcoholism Father     Breast cancer Maternal Grandmother        great-grandmother   Breast cancer Cousin    Diabetes Paternal Grandfather    Allergic rhinitis Maternal Aunt    Migraines Neg Hx       Social History   Socioeconomic History   Marital status: Married    Spouse name: Darren   Number of children: 2   Years of education: 12   Highest education level: Not on file  Occupational History   Occupation: Doctor, hospital  Tobacco Use   Smoking status: Never   Smokeless tobacco: Never   Tobacco comments:    NEVER USED TOBACCO  Vaping Use   Vaping Use: Never used  Substance and Sexual Activity   Alcohol use: Yes    Alcohol/week: 0.0 standard drinks of alcohol    Comment: Very rare   Drug use: No   Sexual activity: Yes    Birth control/protection: Pill  Other Topics Concern   Not on file  Social History Narrative   Lives at home husband and 2 kids.   Right handed.   Caffeine use: sometimes a cup of a soda/day, may or may not be caffeinated. Coffee: 2-3 times per week   Social Determinants of Health   Financial Resource Strain: Not on file  Food Insecurity: Not on file  Transportation Needs: Not on file  Physical Activity: Not on file  Stress: Not on file  Social Connections: Not on file  Allergies  Allergen Reactions   Citric Acid     Burns inside of mouth   Latex Rash   Penicillins Cross Reactors Hives and Rash     Current Outpatient Medications:    albuterol (VENTOLIN HFA) 108 (90 Base) MCG/ACT inhaler, Inhale 1-2 puffs into the lungs every 6 (six) hours as needed for wheezing or shortness of breath., Disp: 8 g, Rfl: 0   diphenhydrAMINE (BENADRYL) 25 mg capsule, Take 25 mg by mouth every 6 (six) hours as needed., Disp: , Rfl:    diphenhydrAMINE-APAP, sleep, (TYLENOL PM EXTRA STRENGTH PO), Take by mouth., Disp: , Rfl:    famciclovir (FAMVIR) 500 MG tablet, Take 1 tablet (500 mg total) by mouth 2 (two) times daily., Disp: 60 tablet, Rfl: 11   fexofenadine (ALLEGRA  ALLERGY) 180 MG tablet, Take 1 tablet (180 mg total) by mouth 2 (two) times a day., Disp: 60 tablet, Rfl: 5   Fremanezumab-vfrm (AJOVY) 225 MG/1.5ML SOAJ, Inject 225 mg into the skin every 30 (thirty) days., Disp: 1.5 mL, Rfl: 11   hydroxychloroquine (PLAQUENIL) 200 MG tablet, Take 200 mg by mouth daily., Disp: , Rfl:    ibuprofen (ADVIL,MOTRIN) 200 MG tablet, Take 800 mg by mouth every 6 (six) hours as needed for moderate pain., Disp: , Rfl:    levocetirizine (XYZAL) 5 MG tablet, Take 5 mg by mouth every evening., Disp: , Rfl:    meloxicam (MOBIC) 15 MG tablet, Take 15 mg by mouth daily as needed., Disp: , Rfl:    ondansetron (ZOFRAN-ODT) 4 MG disintegrating tablet, Take 1-2 tablets (4-8 mg total) by mouth every 8 (eight) hours as needed., Disp: 30 tablet, Rfl: 11   predniSONE (DELTASONE) 10 MG tablet, Take by mouth., Disp: , Rfl:    rizatriptan (MAXALT-MLT) 10 MG disintegrating tablet, DISSOLVE 1 TABLET BY MOUTH AS NEEDED FOR MIGRAINE. MAY REPEAT IN 2 HOURS IF NEEDED, Disp: 9 tablet, Rfl: 11   SPRINTEC 28 0.25-35 MG-MCG tablet, Take 1 tablet by mouth daily., Disp: , Rfl:    Ubrogepant (UBRELVY) 100 MG TABS, Take 100 mg by mouth every 2 (two) hours as needed. Maximum 200mg  a day., Disp: 16 tablet, Rfl: 11   Review of Systems  Constitutional:  Negative for activity change, appetite change, chills, diaphoresis, fatigue, fever and unexpected weight change.  HENT:  Negative for congestion, rhinorrhea, sinus pressure, sneezing, sore throat and trouble swallowing.   Eyes:  Negative for photophobia and visual disturbance.  Respiratory:  Negative for cough, chest tightness, shortness of breath, wheezing and stridor.   Cardiovascular:  Negative for chest pain, palpitations and leg swelling.  Gastrointestinal:  Negative for abdominal distention, abdominal pain, anal bleeding, blood in stool, constipation, diarrhea, nausea and vomiting.  Genitourinary:  Negative for difficulty urinating, dysuria, flank  pain and hematuria.  Musculoskeletal:  Negative for arthralgias, back pain, gait problem, joint swelling and myalgias.  Skin:  Negative for color change, pallor, rash and wound.  Neurological:  Negative for dizziness, tremors, weakness and light-headedness.  Hematological:  Negative for adenopathy. Does not bruise/bleed easily.  Psychiatric/Behavioral:  Negative for agitation, behavioral problems, confusion, decreased concentration, dysphoric mood and sleep disturbance.        Objective:   Physical Exam Constitutional:      General: She is not in acute distress.    Appearance: Normal appearance. She is well-developed. She is not ill-appearing or diaphoretic.  HENT:     Head: Normocephalic and atraumatic.     Right Ear: Hearing and external ear normal.  Left Ear: Hearing and external ear normal.     Nose: No nasal deformity or rhinorrhea.  Eyes:     General: No scleral icterus.    Conjunctiva/sclera: Conjunctivae normal.     Right eye: Right conjunctiva is not injected.     Left eye: Left conjunctiva is not injected.     Pupils: Pupils are equal, round, and reactive to light.  Neck:     Vascular: No JVD.  Cardiovascular:     Rate and Rhythm: Normal rate and regular rhythm.     Heart sounds: Normal heart sounds, S1 normal and S2 normal. No murmur heard.    No friction rub.  Abdominal:     General: Bowel sounds are normal. There is no distension.     Palpations: Abdomen is soft.     Tenderness: There is no abdominal tenderness.  Musculoskeletal:        General: Normal range of motion.     Right shoulder: Normal.     Left shoulder: Normal.     Cervical back: Normal range of motion and neck supple.     Right hip: Normal.     Left hip: Normal.     Right knee: Normal.     Left knee: Normal.  Lymphadenopathy:     Head:     Right side of head: No submandibular, preauricular or posterior auricular adenopathy.     Left side of head: No submandibular, preauricular or posterior  auricular adenopathy.     Cervical: No cervical adenopathy.     Right cervical: No superficial or deep cervical adenopathy.    Left cervical: No superficial or deep cervical adenopathy.  Skin:    General: Skin is warm and dry.     Coloration: Skin is not pale.     Findings: No abrasion, bruising, ecchymosis, erythema, lesion or rash.     Nails: There is no clubbing.  Neurological:     General: No focal deficit present.     Mental Status: She is alert and oriented to person, place, and time.     Sensory: No sensory deficit.     Coordination: Coordination normal.     Gait: Gait normal.  Psychiatric:        Attention and Perception: She is attentive.        Mood and Affect: Mood normal.        Speech: Speech normal.        Behavior: Behavior normal. Behavior is cooperative.        Thought Content: Thought content normal.        Judgment: Judgment normal.           Assessment & Plan:   HSV 2 Mollaret's:  Continue Famvir rx   MCTD: will followup with Rheumatology re adding immunosuppressive therapy.

## 2022-05-26 DIAGNOSIS — M609 Myositis, unspecified: Secondary | ICD-10-CM | POA: Diagnosis not present

## 2022-05-26 DIAGNOSIS — M791 Myalgia, unspecified site: Secondary | ICD-10-CM | POA: Diagnosis not present

## 2022-05-26 DIAGNOSIS — M0569 Rheumatoid arthritis of multiple sites with involvement of other organs and systems: Secondary | ICD-10-CM | POA: Diagnosis not present

## 2022-05-26 DIAGNOSIS — M329 Systemic lupus erythematosus, unspecified: Secondary | ICD-10-CM | POA: Diagnosis not present

## 2022-06-13 DIAGNOSIS — M0569 Rheumatoid arthritis of multiple sites with involvement of other organs and systems: Secondary | ICD-10-CM | POA: Diagnosis not present

## 2022-06-23 DIAGNOSIS — Z6829 Body mass index (BMI) 29.0-29.9, adult: Secondary | ICD-10-CM | POA: Diagnosis not present

## 2022-06-23 DIAGNOSIS — Z01419 Encounter for gynecological examination (general) (routine) without abnormal findings: Secondary | ICD-10-CM | POA: Diagnosis not present

## 2022-06-23 DIAGNOSIS — Z113 Encounter for screening for infections with a predominantly sexual mode of transmission: Secondary | ICD-10-CM | POA: Diagnosis not present

## 2022-07-05 DIAGNOSIS — L0390 Cellulitis, unspecified: Secondary | ICD-10-CM | POA: Diagnosis not present

## 2022-07-12 DIAGNOSIS — M329 Systemic lupus erythematosus, unspecified: Secondary | ICD-10-CM | POA: Diagnosis not present

## 2022-07-12 DIAGNOSIS — M609 Myositis, unspecified: Secondary | ICD-10-CM | POA: Diagnosis not present

## 2022-08-10 DIAGNOSIS — M329 Systemic lupus erythematosus, unspecified: Secondary | ICD-10-CM | POA: Diagnosis not present

## 2022-08-11 DIAGNOSIS — R002 Palpitations: Secondary | ICD-10-CM | POA: Diagnosis not present

## 2022-08-11 DIAGNOSIS — E669 Obesity, unspecified: Secondary | ICD-10-CM | POA: Diagnosis not present

## 2022-08-22 ENCOUNTER — Encounter: Payer: Self-pay | Admitting: Neurology

## 2022-08-22 ENCOUNTER — Telehealth (INDEPENDENT_AMBULATORY_CARE_PROVIDER_SITE_OTHER): Payer: BC Managed Care – PPO | Admitting: Neurology

## 2022-08-22 ENCOUNTER — Telehealth: Payer: Self-pay | Admitting: Neurology

## 2022-08-22 DIAGNOSIS — G43109 Migraine with aura, not intractable, without status migrainosus: Secondary | ICD-10-CM

## 2022-08-22 DIAGNOSIS — G43711 Chronic migraine without aura, intractable, with status migrainosus: Secondary | ICD-10-CM | POA: Diagnosis not present

## 2022-08-22 MED ORDER — UBRELVY 100 MG PO TABS: 100.0000 mg | ORAL_TABLET | ORAL | 11 refills | Status: DC | PRN

## 2022-08-22 MED ORDER — AJOVY 225 MG/1.5ML ~~LOC~~ SOAJ: 225.0000 mg | SUBCUTANEOUS | 11 refills | Status: AC

## 2022-08-22 MED ORDER — RIZATRIPTAN BENZOATE 10 MG PO TBDP: ORAL_TABLET | ORAL | 11 refills | Status: DC

## 2022-08-22 NOTE — Progress Notes (Signed)
[facility]    Provider:  Dr Lucia Gaskins Referring Provider: Georgianne Fick, MD Primary Care Physician:  Georgianne Fick, MD  CC:  Recurrent meningitis and migraines  Virtual Visit via Video Note  I connected with [name] on 08/22/22 at  2:00 PM EST by a video enabled telemedicine application and verified that I am speaking with the correct person using two identifiers.  Location: Patient: home Provider: pffice   I discussed the limitations of evaluation and management by telemedicine and the availability of in person appointments. The patient expressed understanding and agreed to proceed.   Follow Up Instructions:    I discussed the assessment and treatment plan with the patient. The patient was provided an opportunity to ask questions and all were answered. The patient agreed with the plan and demonstrated an understanding of the instructions.   The patient was advised to call back or seek an in-person evaluation if the symptoms worsen or if the condition fails to improve as anticipated.  I provided 15 minutes of non-face-to-face time during this encounter.   Anita Fret, MD 08/22/2022: At baseline she has chronic migraines. At Baseline she has at least >15 migraine days a month that are moderate to severe, pulsating pounding throbbing, unilateral, nausea, light and sound sensitivity.   She is well controlled Well controlled on Ajovy. On Ajovy, only has 4-6 milder migraine days a month and < 10 total headache days a month. Failed maxalt, imitrex. PRN episodic migraines.  Prevention: Ajovy Acute: Bernita Raisin and maxalt  Patient complains of symptoms per HPI as well as the following symptoms: lupus . Pertinent negatives and positives per HPI. All others negative   08/16/2021: She was doing great on the Ajovy.  Unfortunately she had not been seen for 2 years and she needed a new appointment.  She has chronic migraines without aura.  She has  episodic migraines with aura.    Her chronic migraines with aura have been ongoing for years, especially in the last 4 to 6 months, she has at least 15 migraine days a month that are moderate to severe, pulsating pounding throbbing, unilateral, nausea, light and sound sensitivity, did great on Ajovy, will prescribe it for her again.  She also has episodic migraines without aura, only about 4 a month, this would qualify for episodic migraines without aura and maybe we can get her Bernita Raisin for this.  Continue plan, we discussed other options, she was perfectly happy on these prescriptions however she had not been seen in 2 years and we had to see her, I told her to go up and get a new co-pay card, we will leave her 2 samples at the front just in case we have to do a PA.  HPI 07/22/2019 [name] is a 41 y.o. female here as a referral from Dr. Nicholos Johns for recurent meningitis and migraines. She has a long history of migraines.  She had a very bad episode and went to the emergency room, she went to the ED, they did not perform an LP, stiff neck, headache, nausea, couldn't move her neck, she had symptoms of meningitis. She had chills, possibly fevers, did not get an MRI either.  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 She has daily headaches. She also has migraines with nausea, vomiting, unilateral, pulsating/pounding/throbbing/light and sound sensitivity, some sharp and shooting pains, no regular aura (occasional aura light and circles), no  medication overuse,  Migraines last 24-72 hours and are moderately severe to severe, 10-15 migraine days a month, ongoing for over a year. No vision changes, no numbness or tingling, she has light/smell.sound sensitivity, a dark room helps, benadryl helps a little. She is not having anymore children.   Tried: flexeril, maxalt, topamax, nortriptyline, metoprolol,  imitrex  Personally reviewed MRI brain images: Enhancement of the sulci, empty sella, needs repeat imaging  Cbc/cmp unremarkable  Reviewed ED notes: was seen for headache and neck pain x 2 days, nausea, vomitig,exam non focal,  No neurologic deficits, IV meds provided for headache with reduction in pain, it was discussed with neurohospitalist who changed Valtrex dosing.  HPI 10/14/2014:  [name] is a 42 y.o. female here as a referral from Dr. Nicholos Johns for recurent meningitis and migraines.   She has had viral meningitis 5x since 2003, headache, stiff neck, feeling sick, goes away in 5 days approximately. Now taking anti-viral daily. She is on the Valtrex every day twice daily 500mg  twice daily. urring. 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 She has seen multiple infectious disease doctors. She has resultant migraines. Her last episode of meningitis was February of last year. She has resultant headaches. She has headaches that last for weeks at a time. She lives with the pain but sometimes she goes and gets the migraine cocktail. At home she takes excedrin migraine if really needed, she tries not to take it every day. The headache is on one side behind the ear, pulsating throbbing pain, +nausea, +light sensitivity, has blurry vision changes, worsening in severity and frequency, can be 10/10 and misses work. She is getting auras. She uses maxalt for acute management. She gets them 5 days a week. She has tried topamax. Never tried depakote. She is not taking her metoprolol daily, the nurse at the doctor's office prescribed it and so did her doctor. She tried it for a week. Migraines ha been occurring for years. She is not sleeping well at night. She has two children, she tries to go to bed by 10 but she gets up at 5:30 to 6am in the morning. She doesn't sleep like she used to. She drinks a lot of caffeine. Coffee, tea, pepsi, all day. Not breast feeding. No plans for more children currently, using BC.    Reviewed notes, labs and imaging from outside physicians, which showed:  CSF 09/24/14 HSV neg WBC 1 (190 one year ago) Protein 15 (51 a yar ago) Glucose nml  Ct 09/2013 showed No acute intracranial abnormalities including mass lesion or mass effect, hydrocephalus, extra-axial fluid collection, midline shift, hemorrhage, or acute infarction, large ischemic events (personally reviewed images)  Notes from hospitalization in 09/2013 states patient admitted for headache, back pain, neck stiffness and severe headache with csf suggestive of aseptic meningitis, febrile to 102 and LP with 190 WBCs lymphs no organismas. In February this yea she was seen in the ED with head, neck and back pain, fevers. LP did not show WBCs so appears patient was sent home with pcp follow up.  She was seen in the ED in 2013 with migraines, 10/10 pain right side of the head with photo/phonophobia and nausea. Was given migraine cocktail with IV fluids and felt better.  Review of Systems: Patient complains of symptoms per HPI as well as the following symptoms: migraine . Pertinent negatives and positives per HPI. All others negative   Social History   Socioeconomic History   Marital status: Married  Spouse name: Darren   Number of children: 2   Years of education: 12   Highest education level: Not on file  Occupational History   Occupation: Patent examiner  Tobacco Use   Smoking status: Never   Smokeless tobacco: Never   Tobacco comments:    NEVER USED TOBACCO  Vaping Use   Vaping Use: Never used  Substance and Sexual Activity   Alcohol use: Yes    Alcohol/week: 0.0 standard drinks of alcohol    Comment: Very rare   Drug use: No   Sexual activity: Yes    Birth control/protection: Pill  Other Topics Concern   Not on file  Social History Narrative   Lives at home husband and 2 kids.   Right handed.   Caffeine use: sometimes a cup of a soda/day, may or may not be caffeinated. Coffee: 2-3 times per  week   Social Determinants of Health   Financial Resource Strain: Not on file  Food Insecurity: Not on file  Transportation Needs: Not on file  Physical Activity: Not on file  Stress: Not on file  Social Connections: Not on file  Intimate Partner Violence: Not on file    Family History  Problem Relation Age of Onset   Allergic rhinitis Mother    Cirrhosis Father    Alcoholism Father    Breast cancer Maternal Grandmother        great-grandmother   Breast cancer Cousin    Diabetes Paternal Grandfather    Allergic rhinitis Maternal Aunt    Migraines Neg Hx     Past Medical History:  Diagnosis Date   Anemia    Iron deficiency anemia 11/05/2014   Menometrorrhagia 11/05/2014   Migraine    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 Maximum 200mg  a day. 16 tablet 11   albuterol (VENTOLIN HFA) 108 (90 Base) MCG/ACT inhaler Inhale 1-2 puffs into the lungs every 6 (six) hours as needed for wheezing or shortness of breath. 8 g 0   diphenhydrAMINE (BENADRYL) 25 mg capsule Take 25 mg by mouth every 6 (six) hours as needed.     diphenhydrAMINE-APAP, sleep, (TYLENOL PM EXTRA STRENGTH PO) Take by mouth.     famciclovir (FAMVIR) 500 MG tablet Take 1 tablet (500 mg total) by mouth 2 (two) times daily. 60 tablet 11   fexofenadine (ALLEGRA ALLERGY) 180 MG tablet Take 1 tablet (180 mg total) by mouth 2 (two) times a day. 60 tablet 5   Fremanezumab-vfrm (AJOVY) 225 MG/1.5ML SOAJ Inject 225 mg into the skin every 30 (thirty) days. 1.5 mL 11   hydroxychloroquine  (PLAQUENIL) 200 MG tablet Take 200 mg by mouth daily.     ibuprofen (ADVIL,MOTRIN) 200 MG tablet Take 800 mg by mouth every 6 (six) hours as needed for moderate pain.     levocetirizine (XYZAL) 5 MG tablet Take 5 mg by mouth every evening.     meloxicam (MOBIC) 15 MG tablet Take 15 mg by mouth daily as needed.     predniSONE (DELTASONE) 10 MG  tablet Take by mouth.     rizatriptan (MAXALT-MLT) 10 MG disintegrating tablet DISSOLVE 1 TABLET BY MOUTH AS NEEDED FOR MIGRAINE. MAY REPEAT IN 2 HOURS IF NEEDED 9 tablet 11   SPRINTEC 28 0.25-35 MG-MCG tablet Take 1 tablet by mouth daily.     No current facility-administered medications for this visit.    Allergies as of 08/22/2022 - Review Complete 08/22/2022  Allergen Reaction Noted   Citric acid  05/23/2011   Latex Rash 06/14/2013   Penicillins cross reactors Hives and Rash 05/23/2011    Vitals: There were no vitals taken for this visit. Last Weight:  Wt Readings from Last 1 Encounters:  04/20/22 185 lb (83.9 kg)   Last Height:   Ht Readings from Last 1 Encounters:  04/20/22 5\' 7"  (1.702 m)    Physical exam: Exam: Gen: NAD, conversant      CV:  Denies palpitations or chest pain or SOB. VS: Breathing at a normal rate. Weight appears obese. Not febrile. Eyes: Conjunctivae clear without exudates or hemorrhage  Neuro: Detailed Neurologic Exam  Speech:    Speech is normal; fluent and spontaneous with normal comprehension.  Cognition:    The patient is oriented to person, place, and time;     recent and remote memory intact;     language fluent;     normal attention, concentration,     fund of knowledge Cranial Nerves:    The pupils are equal, round, and reactive to light. Cannot perform fundoscopic exam. Visual fields are full to finger confrontation. Extraocular movements are intact.  The face is symmetric with normal sensation. The palate elevates in the midline. Hearing intact. Voice is normal. Shoulder shrug is normal. The  tongue has normal motion without fasciculations.   Coordination:    Normal finger to nose  Gait:    Normal native gait  Motor Observation:   no involuntary movements noted. Tone:    Appears normal  Posture:    Posture is normal. normal erect    Strength:    Strength is anti-gravity and symmetric in the upper and lower limbs.      Sensation: intact to LT           Assessment/Plan:  42 year female with Mollaret's Meningitis who is here for persistent headaches and migraines.  Continue daily valtrex. She has tried and failed multiple medicationss: Tried: flexeril, maxalt, topamax, nortriptyline, metoprolol  At baseline she has chronic migraines. At Baseline she has at least >15 migraine days a month that are moderate to severe, pulsating pounding throbbing, unilateral, nausea, light and sound sensitivity.   She is well controlled Well controlled on Ajovy. On Ajovy, only has 4-6 milder migraine days a month and < 10 total headache days a month. Failed maxalt, imitrex. PRN episodic migraines.  Prevention: Ajovy Acute: Ubrelvy and maxalt  Meds ordered this encounter  Medications   Fremanezumab-vfrm (AJOVY) 225 MG/1.5ML SOAJ    Sig: Inject 225 mg into the skin every 30 (thirty) days.    Dispense:  1.5 mL    Refill:  11    Chronic migraine without aura   rizatriptan (MAXALT-MLT) 10 MG disintegrating tablet    Sig: DISSOLVE 1 TABLET BY MOUTH AS NEEDED FOR MIGRAINE. MAY REPEAT IN 2 HOURS IF NEEDED    Dispense:  9 tablet    Refill:  11   Ubrogepant (UBRELVY) 100 MG TABS    Sig: Take 100 mg by mouth every 2 (two) hours as needed. Maximum 200mg  a  day.    Dispense:  16 tablet    Refill:  11    Well controlled on Ajovy. On Ajovy, only has 4-6 migraine days a month and < 10 total headache days a month. Failed maxalt, imitrex. PRN episodic migraines.      Sarina Ill, MD  [facility] [address] [place], [place] [address]  Phone  [phone] Fax [phone]

## 2022-08-22 NOTE — Telephone Encounter (Signed)
Scheduled pt for VV with Megan for 07/18/23 at 1:45pm

## 2022-08-22 NOTE — Telephone Encounter (Signed)
Please call and schedule her for a f/u video appt with an NP for med refill Dec 2024 thanks

## 2022-08-23 ENCOUNTER — Encounter: Payer: Self-pay | Admitting: Hematology & Oncology

## 2022-09-05 ENCOUNTER — Encounter: Payer: Self-pay | Admitting: Neurology

## 2022-09-06 NOTE — Telephone Encounter (Signed)
Ajovy PA completed on CMM. Key: [id number]. Awaiting determination from [place].

## 2022-09-16 DIAGNOSIS — M329 Systemic lupus erythematosus, unspecified: Secondary | ICD-10-CM | POA: Diagnosis not present

## 2022-09-16 DIAGNOSIS — M2550 Pain in unspecified joint: Secondary | ICD-10-CM | POA: Diagnosis not present

## 2022-09-16 DIAGNOSIS — Z79899 Other long term (current) drug therapy: Secondary | ICD-10-CM | POA: Diagnosis not present

## 2022-09-20 DIAGNOSIS — I10 Essential (primary) hypertension: Secondary | ICD-10-CM | POA: Diagnosis not present

## 2022-09-27 DIAGNOSIS — G032 Benign recurrent meningitis [Mollaret]: Secondary | ICD-10-CM | POA: Diagnosis not present

## 2022-09-27 DIAGNOSIS — I10 Essential (primary) hypertension: Secondary | ICD-10-CM | POA: Diagnosis not present

## 2022-09-27 DIAGNOSIS — R000 Tachycardia, unspecified: Secondary | ICD-10-CM | POA: Diagnosis not present

## 2022-09-27 DIAGNOSIS — M329 Systemic lupus erythematosus, unspecified: Secondary | ICD-10-CM | POA: Diagnosis not present

## 2022-09-27 DIAGNOSIS — Z20822 Contact with and (suspected) exposure to covid-19: Secondary | ICD-10-CM | POA: Diagnosis not present

## 2022-09-27 DIAGNOSIS — Z79899 Other long term (current) drug therapy: Secondary | ICD-10-CM | POA: Diagnosis not present

## 2022-09-27 DIAGNOSIS — Z79631 Long term (current) use of antimetabolite agent: Secondary | ICD-10-CM | POA: Diagnosis not present

## 2022-09-27 DIAGNOSIS — G43919 Migraine, unspecified, intractable, without status migrainosus: Secondary | ICD-10-CM | POA: Diagnosis not present

## 2022-09-27 DIAGNOSIS — R071 Chest pain on breathing: Secondary | ICD-10-CM | POA: Diagnosis not present

## 2022-09-27 DIAGNOSIS — I309 Acute pericarditis, unspecified: Secondary | ICD-10-CM | POA: Diagnosis not present

## 2022-09-27 DIAGNOSIS — R0789 Other chest pain: Secondary | ICD-10-CM | POA: Diagnosis not present

## 2022-09-27 DIAGNOSIS — Z7952 Long term (current) use of systemic steroids: Secondary | ICD-10-CM | POA: Diagnosis not present

## 2022-09-27 DIAGNOSIS — I160 Hypertensive urgency: Secondary | ICD-10-CM | POA: Diagnosis not present

## 2022-09-27 DIAGNOSIS — E876 Hypokalemia: Secondary | ICD-10-CM | POA: Diagnosis not present

## 2022-09-27 DIAGNOSIS — I071 Rheumatic tricuspid insufficiency: Secondary | ICD-10-CM | POA: Diagnosis not present

## 2022-09-27 DIAGNOSIS — I5A Non-ischemic myocardial injury (non-traumatic): Secondary | ICD-10-CM | POA: Diagnosis not present

## 2022-09-27 DIAGNOSIS — R079 Chest pain, unspecified: Secondary | ICD-10-CM | POA: Diagnosis not present

## 2022-09-27 DIAGNOSIS — I214 Non-ST elevation (NSTEMI) myocardial infarction: Secondary | ICD-10-CM | POA: Diagnosis not present

## 2022-09-27 DIAGNOSIS — R0602 Shortness of breath: Secondary | ICD-10-CM | POA: Diagnosis not present

## 2022-09-27 DIAGNOSIS — Z8661 Personal history of infections of the central nervous system: Secondary | ICD-10-CM | POA: Diagnosis not present

## 2022-09-27 DIAGNOSIS — R519 Headache, unspecified: Secondary | ICD-10-CM | POA: Diagnosis not present

## 2022-09-27 DIAGNOSIS — E559 Vitamin D deficiency, unspecified: Secondary | ICD-10-CM | POA: Diagnosis not present

## 2022-09-27 DIAGNOSIS — E860 Dehydration: Secondary | ICD-10-CM | POA: Diagnosis not present

## 2022-09-27 DIAGNOSIS — G43909 Migraine, unspecified, not intractable, without status migrainosus: Secondary | ICD-10-CM | POA: Diagnosis not present

## 2022-09-27 DIAGNOSIS — G43711 Chronic migraine without aura, intractable, with status migrainosus: Secondary | ICD-10-CM | POA: Diagnosis not present

## 2022-09-28 DIAGNOSIS — I071 Rheumatic tricuspid insufficiency: Secondary | ICD-10-CM | POA: Diagnosis not present

## 2022-10-05 DIAGNOSIS — R0989 Other specified symptoms and signs involving the circulatory and respiratory systems: Secondary | ICD-10-CM | POA: Diagnosis not present

## 2022-10-05 DIAGNOSIS — R051 Acute cough: Secondary | ICD-10-CM | POA: Diagnosis not present

## 2022-10-12 ENCOUNTER — Ambulatory Visit: Payer: BC Managed Care – PPO | Admitting: Infectious Disease

## 2022-11-11 DIAGNOSIS — Z79899 Other long term (current) drug therapy: Secondary | ICD-10-CM | POA: Diagnosis not present

## 2022-11-11 DIAGNOSIS — M2550 Pain in unspecified joint: Secondary | ICD-10-CM | POA: Diagnosis not present

## 2022-11-11 DIAGNOSIS — M329 Systemic lupus erythematosus, unspecified: Secondary | ICD-10-CM | POA: Diagnosis not present

## 2022-12-05 ENCOUNTER — Encounter: Payer: Self-pay | Admitting: *Deleted

## 2022-12-05 NOTE — Telephone Encounter (Signed)
Received paper Bernita Raisin PA. I sent the patient a message on mychart as the PA is asking if patient has used this medication within the last 180 days.

## 2022-12-06 ENCOUNTER — Ambulatory Visit: Payer: BC Managed Care – PPO | Admitting: Infectious Disease

## 2022-12-06 NOTE — Progress Notes (Deleted)
Subjective:  Chief complaint: Follow-up for molaret'smeningitis     Patient ID: [name], female    DOB: 07/22/1981, 42 y.o.   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  The last time she had such an episode was in 2020 and she was just treated with increased dose of Valtrex prior to that she had gone roughly 6 years without a flare of it while taking Valtrex 500 mg twice daily.  She is been diagnosed with connective tissue disease I believe lupus though I cannot read the referring physician's notes currently as they are printed in a "double vision print."'  My understanding is that [name] is being contemplated  We switched her to Famvir 500 mg twice daily which she was olerating well.  She does have some dryness of the tips of her fingers which she thinks might be an adverse reaction t    She is following up with rheumatology with regards to initiation of Benlysta.  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   Viral meningitis     Past Surgical History:  Procedure Laterality Date   CESAREAN SECTION N/A 06/15/2013   Procedure: CESAREAN SECTION;  Surgeon: Philip Aspen, DO;  Location: [facility];  Service: Obstetrics;  Laterality: N/A;   FETAL BLOOD TRANSFUSION  2014   With C-section    Family History  Problem Relation Age of Onset   Allergic rhinitis Mother    Cirrhosis Father    Alcoholism Father    Breast cancer Maternal Grandmother         great-grandmother   Breast cancer Cousin    Diabetes Paternal Grandfather    Allergic rhinitis Maternal Aunt    Migraines Neg Hx       Social History   Socioeconomic History   Marital status: Married    Spouse name: Darren   Number of children: 2   Years of education: 12   Highest education level: Not on file  Occupational History   Occupation: Doctor, hospital  Tobacco Use   Smoking status: Never   Smokeless tobacco: Never   Tobacco comments:    NEVER USED TOBACCO  Vaping Use   Vaping Use: Never used  Substance and Sexual Activity   Alcohol use: Yes    Alcohol/week: 0.0 standard drinks of alcohol    Comment: Very rare   Drug use: No   Sexual activity: Yes    Birth control/protection: Pill  Other Topics Concern   Not on file  Social History Narrative   Lives at home husband and 2 kids.   Right handed.   Caffeine use: sometimes a cup of a soda/day, may or may not be caffeinated. Coffee: 2-3 times per week   Social Determinants of Health   Financial Resource Strain: Not on file  Food Insecurity: Not on file  Transportation Needs: Not on file  Physical Activity: Not on file  Stress: Not on file  Social Connections: Not on file    Allergies  Allergen Reactions   Citric Acid     Burns inside of mouth   Latex Rash   Penicillins Cross Reactors Hives and Rash     Current Outpatient Medications:    albuterol (VENTOLIN HFA) 108 (90 Base) MCG/ACT inhaler, Inhale 1-2 puffs into the lungs every 6 (six) hours as needed for wheezing or shortness of breath., Disp: 8 g, Rfl: 0   diphenhydrAMINE (BENADRYL) 25 mg capsule, Take 25 mg by mouth every 6 (six) hours as needed., Disp: , Rfl:    diphenhydrAMINE-APAP, sleep, (TYLENOL PM EXTRA STRENGTH PO), Take by mouth., Disp: , Rfl:    famciclovir (FAMVIR) 500 MG tablet, Take 1 tablet (500 mg total) by mouth 2 (two) times daily., Disp: 60 tablet, Rfl: 11   fexofenadine (ALLEGRA ALLERGY) 180 MG tablet, Take 1 tablet (180 mg  total) by mouth 2 (two) times a day., Disp: 60 tablet, Rfl: 5   Fremanezumab-vfrm (AJOVY) 225 MG/1.5ML SOAJ, Inject 225 mg into the skin every 30 (thirty) days., Disp: 1.5 mL, Rfl: 11   hydroxychloroquine (PLAQUENIL) 200 MG tablet, Take 200 mg by mouth daily., Disp: , Rfl:    ibuprofen (ADVIL,MOTRIN) 200 MG tablet, Take 800 mg by mouth every 6 (six) hours as needed for moderate pain., Disp: , Rfl:    levocetirizine (XYZAL) 5 MG tablet, Take 5 mg by mouth every evening., Disp: , Rfl:    meloxicam (MOBIC) 15 MG tablet, Take 15 mg by mouth daily as needed., Disp: , Rfl:    predniSONE (DELTASONE) 10 MG tablet, Take by mouth., Disp: , Rfl:    rizatriptan (MAXALT-MLT) 10 MG disintegrating tablet, DISSOLVE 1 TABLET BY MOUTH AS NEEDED FOR MIGRAINE. MAY REPEAT IN 2 HOURS IF NEEDED, Disp: 9 tablet, Rfl: 11   SPRINTEC 28 0.25-35 MG-MCG tablet, Take 1 tablet by mouth daily., Disp: , Rfl:    Ubrogepant (UBRELVY) 100 MG TABS, Take 100 mg by mouth every 2 (two) hours as needed. Maximum 200mg  a day., Disp: 16 tablet, Rfl: 11   Review of Systems     Objective:   Physical Exam        Assessment & Plan:   HSV 2 [name]'s:  Continue Famvir rx   MCTD: will followup with Rheumatology re adding immunosuppressive therapy.

## 2022-12-08 NOTE — Telephone Encounter (Signed)
Faxed PA to [facility]. Received a receipt of confirmation. Spoke with pt on phone. She said she had used it in 180 days.

## 2022-12-13 NOTE — Telephone Encounter (Signed)
[employer] sent fax to [place] asking if patient is receiving migraine symptom relief from [place]. I called the patient and she confirmed she has been receiving relief. Form is ready for Dr Trevor Mace signature then will be faxed back to [facility] at [phone].

## 2022-12-16 DIAGNOSIS — R9431 Abnormal electrocardiogram [ECG] [EKG]: Secondary | ICD-10-CM | POA: Diagnosis not present

## 2022-12-16 DIAGNOSIS — J90 Pleural effusion, not elsewhere classified: Secondary | ICD-10-CM | POA: Diagnosis not present

## 2022-12-16 DIAGNOSIS — I071 Rheumatic tricuspid insufficiency: Secondary | ICD-10-CM | POA: Diagnosis not present

## 2022-12-16 DIAGNOSIS — M3214 Glomerular disease in systemic lupus erythematosus: Secondary | ICD-10-CM | POA: Diagnosis not present

## 2022-12-16 DIAGNOSIS — J9811 Atelectasis: Secondary | ICD-10-CM | POA: Diagnosis not present

## 2022-12-16 DIAGNOSIS — J189 Pneumonia, unspecified organism: Secondary | ICD-10-CM | POA: Diagnosis not present

## 2022-12-16 DIAGNOSIS — R319 Hematuria, unspecified: Secondary | ICD-10-CM | POA: Diagnosis not present

## 2022-12-16 DIAGNOSIS — R000 Tachycardia, unspecified: Secondary | ICD-10-CM | POA: Diagnosis not present

## 2022-12-16 DIAGNOSIS — I272 Pulmonary hypertension, unspecified: Secondary | ICD-10-CM | POA: Diagnosis not present

## 2022-12-16 DIAGNOSIS — Z7952 Long term (current) use of systemic steroids: Secondary | ICD-10-CM | POA: Diagnosis not present

## 2022-12-16 DIAGNOSIS — R0789 Other chest pain: Secondary | ICD-10-CM | POA: Diagnosis not present

## 2022-12-16 DIAGNOSIS — Z8661 Personal history of infections of the central nervous system: Secondary | ICD-10-CM | POA: Diagnosis not present

## 2022-12-16 DIAGNOSIS — R0602 Shortness of breath: Secondary | ICD-10-CM | POA: Diagnosis not present

## 2022-12-16 DIAGNOSIS — R079 Chest pain, unspecified: Secondary | ICD-10-CM | POA: Diagnosis not present

## 2022-12-16 DIAGNOSIS — I10 Essential (primary) hypertension: Secondary | ICD-10-CM | POA: Diagnosis not present

## 2022-12-16 DIAGNOSIS — I21A1 Myocardial infarction type 2: Secondary | ICD-10-CM | POA: Diagnosis not present

## 2022-12-16 DIAGNOSIS — E871 Hypo-osmolality and hyponatremia: Secondary | ICD-10-CM | POA: Diagnosis not present

## 2022-12-16 DIAGNOSIS — R809 Proteinuria, unspecified: Secondary | ICD-10-CM | POA: Diagnosis not present

## 2022-12-16 DIAGNOSIS — E876 Hypokalemia: Secondary | ICD-10-CM | POA: Diagnosis not present

## 2022-12-16 DIAGNOSIS — R0902 Hypoxemia: Secondary | ICD-10-CM | POA: Diagnosis not present

## 2022-12-19 DIAGNOSIS — K649 Unspecified hemorrhoids: Secondary | ICD-10-CM | POA: Diagnosis not present

## 2022-12-19 DIAGNOSIS — J948 Other specified pleural conditions: Secondary | ICD-10-CM | POA: Diagnosis not present

## 2022-12-19 DIAGNOSIS — M3219 Other organ or system involvement in systemic lupus erythematosus: Secondary | ICD-10-CM | POA: Diagnosis not present

## 2022-12-19 DIAGNOSIS — I10 Essential (primary) hypertension: Secondary | ICD-10-CM | POA: Diagnosis not present

## 2022-12-19 DIAGNOSIS — Z880 Allergy status to penicillin: Secondary | ICD-10-CM | POA: Diagnosis not present

## 2022-12-19 DIAGNOSIS — M329 Systemic lupus erythematosus, unspecified: Secondary | ICD-10-CM | POA: Diagnosis not present

## 2022-12-19 DIAGNOSIS — Z5320 Procedure and treatment not carried out because of patient's decision for unspecified reasons: Secondary | ICD-10-CM | POA: Diagnosis not present

## 2022-12-19 DIAGNOSIS — N181 Chronic kidney disease, stage 1: Secondary | ICD-10-CM | POA: Diagnosis not present

## 2022-12-19 DIAGNOSIS — R846 Abnormal cytological findings in specimens from respiratory organs and thorax: Secondary | ICD-10-CM | POA: Diagnosis not present

## 2022-12-19 DIAGNOSIS — J90 Pleural effusion, not elsewhere classified: Secondary | ICD-10-CM | POA: Diagnosis not present

## 2022-12-19 DIAGNOSIS — J159 Unspecified bacterial pneumonia: Secondary | ICD-10-CM | POA: Diagnosis not present

## 2022-12-19 DIAGNOSIS — Z8661 Personal history of infections of the central nervous system: Secondary | ICD-10-CM | POA: Diagnosis not present

## 2022-12-19 DIAGNOSIS — J189 Pneumonia, unspecified organism: Secondary | ICD-10-CM | POA: Diagnosis not present

## 2022-12-19 DIAGNOSIS — J9811 Atelectasis: Secondary | ICD-10-CM | POA: Diagnosis not present

## 2022-12-19 DIAGNOSIS — Z452 Encounter for adjustment and management of vascular access device: Secondary | ICD-10-CM | POA: Diagnosis not present

## 2022-12-19 DIAGNOSIS — M3214 Glomerular disease in systemic lupus erythematosus: Secondary | ICD-10-CM | POA: Diagnosis not present

## 2022-12-19 DIAGNOSIS — R918 Other nonspecific abnormal finding of lung field: Secondary | ICD-10-CM | POA: Diagnosis not present

## 2022-12-19 DIAGNOSIS — J984 Other disorders of lung: Secondary | ICD-10-CM | POA: Diagnosis not present

## 2022-12-19 DIAGNOSIS — K921 Melena: Secondary | ICD-10-CM | POA: Diagnosis not present

## 2022-12-19 DIAGNOSIS — R9431 Abnormal electrocardiogram [ECG] [EKG]: Secondary | ICD-10-CM | POA: Diagnosis not present

## 2022-12-19 DIAGNOSIS — N179 Acute kidney failure, unspecified: Secondary | ICD-10-CM | POA: Diagnosis not present

## 2022-12-19 DIAGNOSIS — J939 Pneumothorax, unspecified: Secondary | ICD-10-CM | POA: Diagnosis not present

## 2022-12-19 DIAGNOSIS — R319 Hematuria, unspecified: Secondary | ICD-10-CM | POA: Diagnosis not present

## 2022-12-19 DIAGNOSIS — I3139 Other pericardial effusion (noninflammatory): Secondary | ICD-10-CM | POA: Diagnosis not present

## 2022-12-19 DIAGNOSIS — I071 Rheumatic tricuspid insufficiency: Secondary | ICD-10-CM | POA: Diagnosis not present

## 2022-12-19 DIAGNOSIS — E871 Hypo-osmolality and hyponatremia: Secondary | ICD-10-CM | POA: Diagnosis not present

## 2022-12-19 DIAGNOSIS — R809 Proteinuria, unspecified: Secondary | ICD-10-CM | POA: Diagnosis not present

## 2022-12-19 DIAGNOSIS — J95811 Postprocedural pneumothorax: Secondary | ICD-10-CM | POA: Diagnosis not present

## 2022-12-19 DIAGNOSIS — R0600 Dyspnea, unspecified: Secondary | ICD-10-CM | POA: Diagnosis not present

## 2022-12-19 DIAGNOSIS — I272 Pulmonary hypertension, unspecified: Secondary | ICD-10-CM | POA: Diagnosis not present

## 2022-12-19 DIAGNOSIS — Z79899 Other long term (current) drug therapy: Secondary | ICD-10-CM | POA: Diagnosis not present

## 2022-12-19 DIAGNOSIS — I129 Hypertensive chronic kidney disease with stage 1 through stage 4 chronic kidney disease, or unspecified chronic kidney disease: Secondary | ICD-10-CM | POA: Diagnosis not present

## 2022-12-19 DIAGNOSIS — G43909 Migraine, unspecified, not intractable, without status migrainosus: Secondary | ICD-10-CM | POA: Diagnosis not present

## 2022-12-23 DIAGNOSIS — J95811 Postprocedural pneumothorax: Secondary | ICD-10-CM | POA: Diagnosis not present

## 2022-12-23 DIAGNOSIS — J90 Pleural effusion, not elsewhere classified: Secondary | ICD-10-CM | POA: Diagnosis not present

## 2022-12-24 DIAGNOSIS — J939 Pneumothorax, unspecified: Secondary | ICD-10-CM | POA: Diagnosis not present

## 2022-12-25 DIAGNOSIS — J939 Pneumothorax, unspecified: Secondary | ICD-10-CM | POA: Diagnosis not present

## 2022-12-26 DIAGNOSIS — J90 Pleural effusion, not elsewhere classified: Secondary | ICD-10-CM | POA: Diagnosis not present

## 2022-12-26 DIAGNOSIS — J948 Other specified pleural conditions: Secondary | ICD-10-CM | POA: Diagnosis not present

## 2022-12-27 DIAGNOSIS — M329 Systemic lupus erythematosus, unspecified: Secondary | ICD-10-CM | POA: Diagnosis not present

## 2022-12-27 DIAGNOSIS — J9811 Atelectasis: Secondary | ICD-10-CM | POA: Diagnosis not present

## 2022-12-27 DIAGNOSIS — Z452 Encounter for adjustment and management of vascular access device: Secondary | ICD-10-CM | POA: Diagnosis not present

## 2022-12-27 DIAGNOSIS — J90 Pleural effusion, not elsewhere classified: Secondary | ICD-10-CM | POA: Diagnosis not present

## 2022-12-28 DIAGNOSIS — M329 Systemic lupus erythematosus, unspecified: Secondary | ICD-10-CM | POA: Diagnosis not present

## 2023-01-04 DIAGNOSIS — R29898 Other symptoms and signs involving the musculoskeletal system: Secondary | ICD-10-CM | POA: Diagnosis not present

## 2023-01-04 DIAGNOSIS — M329 Systemic lupus erythematosus, unspecified: Secondary | ICD-10-CM | POA: Diagnosis not present

## 2023-01-04 DIAGNOSIS — Z789 Other specified health status: Secondary | ICD-10-CM | POA: Diagnosis not present

## 2023-01-04 DIAGNOSIS — R279 Unspecified lack of coordination: Secondary | ICD-10-CM | POA: Diagnosis not present

## 2023-01-07 DIAGNOSIS — M1990 Unspecified osteoarthritis, unspecified site: Secondary | ICD-10-CM | POA: Diagnosis not present

## 2023-01-07 DIAGNOSIS — R7989 Other specified abnormal findings of blood chemistry: Secondary | ICD-10-CM | POA: Diagnosis not present

## 2023-01-07 DIAGNOSIS — R202 Paresthesia of skin: Secondary | ICD-10-CM | POA: Diagnosis not present

## 2023-01-07 DIAGNOSIS — G7249 Other inflammatory and immune myopathies, not elsewhere classified: Secondary | ICD-10-CM | POA: Diagnosis not present

## 2023-01-07 DIAGNOSIS — G5793 Unspecified mononeuropathy of bilateral lower limbs: Secondary | ICD-10-CM | POA: Diagnosis not present

## 2023-01-07 DIAGNOSIS — M3219 Other organ or system involvement in systemic lupus erythematosus: Secondary | ICD-10-CM | POA: Diagnosis not present

## 2023-01-07 DIAGNOSIS — J189 Pneumonia, unspecified organism: Secondary | ICD-10-CM | POA: Diagnosis not present

## 2023-01-07 DIAGNOSIS — M329 Systemic lupus erythematosus, unspecified: Secondary | ICD-10-CM | POA: Diagnosis not present

## 2023-01-07 DIAGNOSIS — J948 Other specified pleural conditions: Secondary | ICD-10-CM | POA: Diagnosis not present

## 2023-01-07 DIAGNOSIS — M791 Myalgia, unspecified site: Secondary | ICD-10-CM | POA: Diagnosis not present

## 2023-01-07 DIAGNOSIS — R748 Abnormal levels of other serum enzymes: Secondary | ICD-10-CM | POA: Diagnosis not present

## 2023-01-07 DIAGNOSIS — G2581 Restless legs syndrome: Secondary | ICD-10-CM | POA: Diagnosis not present

## 2023-01-07 DIAGNOSIS — N069 Isolated proteinuria with unspecified morphologic lesion: Secondary | ICD-10-CM | POA: Diagnosis not present

## 2023-01-07 DIAGNOSIS — G5790 Unspecified mononeuropathy of unspecified lower limb: Secondary | ICD-10-CM | POA: Diagnosis not present

## 2023-01-07 DIAGNOSIS — J918 Pleural effusion in other conditions classified elsewhere: Secondary | ICD-10-CM | POA: Diagnosis not present

## 2023-01-07 DIAGNOSIS — K7689 Other specified diseases of liver: Secondary | ICD-10-CM | POA: Diagnosis not present

## 2023-01-07 DIAGNOSIS — J90 Pleural effusion, not elsewhere classified: Secondary | ICD-10-CM | POA: Diagnosis not present

## 2023-01-07 DIAGNOSIS — E876 Hypokalemia: Secondary | ICD-10-CM | POA: Diagnosis not present

## 2023-01-07 DIAGNOSIS — J168 Pneumonia due to other specified infectious organisms: Secondary | ICD-10-CM | POA: Diagnosis not present

## 2023-01-07 DIAGNOSIS — Z6831 Body mass index (BMI) 31.0-31.9, adult: Secondary | ICD-10-CM | POA: Diagnosis not present

## 2023-01-07 DIAGNOSIS — R9431 Abnormal electrocardiogram [ECG] [EKG]: Secondary | ICD-10-CM | POA: Diagnosis not present

## 2023-01-07 DIAGNOSIS — G608 Other hereditary and idiopathic neuropathies: Secondary | ICD-10-CM | POA: Diagnosis not present

## 2023-01-07 DIAGNOSIS — E440 Moderate protein-calorie malnutrition: Secondary | ICD-10-CM | POA: Diagnosis not present

## 2023-01-07 DIAGNOSIS — B9729 Other coronavirus as the cause of diseases classified elsewhere: Secondary | ICD-10-CM | POA: Diagnosis not present

## 2023-01-07 DIAGNOSIS — G031 Chronic meningitis: Secondary | ICD-10-CM | POA: Diagnosis not present

## 2023-01-07 DIAGNOSIS — M629 Disorder of muscle, unspecified: Secondary | ICD-10-CM | POA: Diagnosis not present

## 2023-01-07 DIAGNOSIS — R918 Other nonspecific abnormal finding of lung field: Secondary | ICD-10-CM | POA: Diagnosis not present

## 2023-01-07 DIAGNOSIS — M79604 Pain in right leg: Secondary | ICD-10-CM | POA: Diagnosis not present

## 2023-01-07 DIAGNOSIS — R509 Fever, unspecified: Secondary | ICD-10-CM | POA: Diagnosis not present

## 2023-01-07 DIAGNOSIS — K769 Liver disease, unspecified: Secondary | ICD-10-CM | POA: Diagnosis not present

## 2023-01-07 DIAGNOSIS — D849 Immunodeficiency, unspecified: Secondary | ICD-10-CM | POA: Diagnosis not present

## 2023-01-07 DIAGNOSIS — F419 Anxiety disorder, unspecified: Secondary | ICD-10-CM | POA: Diagnosis not present

## 2023-01-07 DIAGNOSIS — G032 Benign recurrent meningitis [Mollaret]: Secondary | ICD-10-CM | POA: Diagnosis not present

## 2023-01-07 DIAGNOSIS — R52 Pain, unspecified: Secondary | ICD-10-CM | POA: Diagnosis not present

## 2023-01-07 DIAGNOSIS — I2489 Other forms of acute ischemic heart disease: Secondary | ICD-10-CM | POA: Diagnosis not present

## 2023-01-07 DIAGNOSIS — Z20822 Contact with and (suspected) exposure to covid-19: Secondary | ICD-10-CM | POA: Diagnosis not present

## 2023-01-07 DIAGNOSIS — R079 Chest pain, unspecified: Secondary | ICD-10-CM | POA: Diagnosis not present

## 2023-01-07 DIAGNOSIS — G629 Polyneuropathy, unspecified: Secondary | ICD-10-CM | POA: Diagnosis not present

## 2023-01-07 DIAGNOSIS — M79609 Pain in unspecified limb: Secondary | ICD-10-CM | POA: Diagnosis not present

## 2023-01-07 DIAGNOSIS — M79605 Pain in left leg: Secondary | ICD-10-CM | POA: Diagnosis not present

## 2023-01-07 DIAGNOSIS — G894 Chronic pain syndrome: Secondary | ICD-10-CM | POA: Diagnosis not present

## 2023-01-18 ENCOUNTER — Telehealth: Payer: Self-pay | Admitting: *Deleted

## 2023-01-18 ENCOUNTER — Encounter: Payer: Self-pay | Admitting: Hematology & Oncology

## 2023-01-18 ENCOUNTER — Other Ambulatory Visit (HOSPITAL_COMMUNITY): Payer: Self-pay

## 2023-01-18 NOTE — Telephone Encounter (Signed)
Ok great, thanks!

## 2023-01-18 NOTE — Telephone Encounter (Signed)
Jearld Shines called to check status on PA. [employer] had not gotten back to [place] yet from paper PA. Can you try to run test claim and see if PA is still being needed?

## 2023-01-18 NOTE — Telephone Encounter (Signed)
   Per test claim it looks as though the PT still needs a PA-I called [employer] Pharm[facility] help desk and verified the RX was approved- Approved from 12/08/2022-12/08/2023 QTY of 16/30DS Approval# [id number]  The reason it looks as if a PA is needed is because the PT had a 30DS filled with [facility]    I called the pharmacy to verify PT p/u and it was mailed out to the PT on 01/13/2023.

## 2023-01-25 DIAGNOSIS — Z7409 Other reduced mobility: Secondary | ICD-10-CM | POA: Diagnosis not present

## 2023-01-25 DIAGNOSIS — Z789 Other specified health status: Secondary | ICD-10-CM | POA: Diagnosis not present

## 2023-01-25 DIAGNOSIS — R279 Unspecified lack of coordination: Secondary | ICD-10-CM | POA: Diagnosis not present

## 2023-01-25 DIAGNOSIS — M329 Systemic lupus erythematosus, unspecified: Secondary | ICD-10-CM | POA: Diagnosis not present

## 2023-01-25 DIAGNOSIS — R29898 Other symptoms and signs involving the musculoskeletal system: Secondary | ICD-10-CM | POA: Diagnosis not present

## 2023-01-26 DIAGNOSIS — Z7409 Other reduced mobility: Secondary | ICD-10-CM | POA: Diagnosis not present

## 2023-01-26 DIAGNOSIS — M329 Systemic lupus erythematosus, unspecified: Secondary | ICD-10-CM | POA: Diagnosis not present

## 2023-01-26 DIAGNOSIS — R29898 Other symptoms and signs involving the musculoskeletal system: Secondary | ICD-10-CM | POA: Diagnosis not present

## 2023-01-26 DIAGNOSIS — Z789 Other specified health status: Secondary | ICD-10-CM | POA: Diagnosis not present

## 2023-01-26 DIAGNOSIS — R279 Unspecified lack of coordination: Secondary | ICD-10-CM | POA: Diagnosis not present

## 2023-02-02 DIAGNOSIS — M329 Systemic lupus erythematosus, unspecified: Secondary | ICD-10-CM | POA: Diagnosis not present

## 2023-02-02 DIAGNOSIS — M609 Myositis, unspecified: Secondary | ICD-10-CM | POA: Diagnosis not present

## 2023-02-02 DIAGNOSIS — Z79899 Other long term (current) drug therapy: Secondary | ICD-10-CM | POA: Diagnosis not present

## 2023-02-02 DIAGNOSIS — Z7952 Long term (current) use of systemic steroids: Secondary | ICD-10-CM | POA: Diagnosis not present

## 2023-02-02 DIAGNOSIS — I272 Pulmonary hypertension, unspecified: Secondary | ICD-10-CM | POA: Diagnosis not present

## 2023-02-03 DIAGNOSIS — R808 Other proteinuria: Secondary | ICD-10-CM | POA: Diagnosis not present

## 2023-02-03 DIAGNOSIS — M609 Myositis, unspecified: Secondary | ICD-10-CM | POA: Diagnosis not present

## 2023-02-03 DIAGNOSIS — I10 Essential (primary) hypertension: Secondary | ICD-10-CM | POA: Diagnosis not present

## 2023-02-06 DIAGNOSIS — Z7409 Other reduced mobility: Secondary | ICD-10-CM | POA: Diagnosis not present

## 2023-02-06 DIAGNOSIS — R29898 Other symptoms and signs involving the musculoskeletal system: Secondary | ICD-10-CM | POA: Diagnosis not present

## 2023-02-06 DIAGNOSIS — M329 Systemic lupus erythematosus, unspecified: Secondary | ICD-10-CM | POA: Diagnosis not present

## 2023-02-06 DIAGNOSIS — R279 Unspecified lack of coordination: Secondary | ICD-10-CM | POA: Diagnosis not present

## 2023-02-06 DIAGNOSIS — Z789 Other specified health status: Secondary | ICD-10-CM | POA: Diagnosis not present

## 2023-02-08 DIAGNOSIS — M329 Systemic lupus erythematosus, unspecified: Secondary | ICD-10-CM | POA: Diagnosis not present

## 2023-02-08 DIAGNOSIS — Z789 Other specified health status: Secondary | ICD-10-CM | POA: Diagnosis not present

## 2023-02-08 DIAGNOSIS — R279 Unspecified lack of coordination: Secondary | ICD-10-CM | POA: Diagnosis not present

## 2023-02-08 DIAGNOSIS — R29898 Other symptoms and signs involving the musculoskeletal system: Secondary | ICD-10-CM | POA: Diagnosis not present

## 2023-02-08 DIAGNOSIS — Z7409 Other reduced mobility: Secondary | ICD-10-CM | POA: Diagnosis not present

## 2023-02-21 DIAGNOSIS — M79605 Pain in left leg: Secondary | ICD-10-CM | POA: Diagnosis not present

## 2023-02-21 DIAGNOSIS — M609 Myositis, unspecified: Secondary | ICD-10-CM | POA: Diagnosis not present

## 2023-02-21 DIAGNOSIS — M79604 Pain in right leg: Secondary | ICD-10-CM | POA: Diagnosis not present

## 2023-02-27 DIAGNOSIS — G629 Polyneuropathy, unspecified: Secondary | ICD-10-CM | POA: Diagnosis not present

## 2023-02-27 DIAGNOSIS — G894 Chronic pain syndrome: Secondary | ICD-10-CM | POA: Diagnosis not present

## 2023-02-27 DIAGNOSIS — M329 Systemic lupus erythematosus, unspecified: Secondary | ICD-10-CM | POA: Diagnosis not present

## 2023-02-27 DIAGNOSIS — Z79899 Other long term (current) drug therapy: Secondary | ICD-10-CM | POA: Diagnosis not present

## 2023-02-27 DIAGNOSIS — I10 Essential (primary) hypertension: Secondary | ICD-10-CM | POA: Diagnosis not present

## 2023-02-27 DIAGNOSIS — R7989 Other specified abnormal findings of blood chemistry: Secondary | ICD-10-CM | POA: Diagnosis not present

## 2023-03-06 DIAGNOSIS — M329 Systemic lupus erythematosus, unspecified: Secondary | ICD-10-CM | POA: Diagnosis not present

## 2023-03-06 DIAGNOSIS — R29898 Other symptoms and signs involving the musculoskeletal system: Secondary | ICD-10-CM | POA: Diagnosis not present

## 2023-03-06 DIAGNOSIS — R279 Unspecified lack of coordination: Secondary | ICD-10-CM | POA: Diagnosis not present

## 2023-03-06 DIAGNOSIS — Z789 Other specified health status: Secondary | ICD-10-CM | POA: Diagnosis not present

## 2023-03-06 DIAGNOSIS — Z7409 Other reduced mobility: Secondary | ICD-10-CM | POA: Diagnosis not present

## 2023-03-09 DIAGNOSIS — Z79899 Other long term (current) drug therapy: Secondary | ICD-10-CM | POA: Diagnosis not present

## 2023-03-09 DIAGNOSIS — M609 Myositis, unspecified: Secondary | ICD-10-CM | POA: Diagnosis not present

## 2023-03-09 DIAGNOSIS — M329 Systemic lupus erythematosus, unspecified: Secondary | ICD-10-CM | POA: Diagnosis not present

## 2023-03-24 DIAGNOSIS — M329 Systemic lupus erythematosus, unspecified: Secondary | ICD-10-CM | POA: Diagnosis not present

## 2023-03-24 DIAGNOSIS — Z79899 Other long term (current) drug therapy: Secondary | ICD-10-CM | POA: Diagnosis not present

## 2023-03-24 DIAGNOSIS — M609 Myositis, unspecified: Secondary | ICD-10-CM | POA: Diagnosis not present

## 2023-03-27 DIAGNOSIS — G894 Chronic pain syndrome: Secondary | ICD-10-CM | POA: Diagnosis not present

## 2023-03-27 DIAGNOSIS — L659 Nonscarring hair loss, unspecified: Secondary | ICD-10-CM | POA: Diagnosis not present

## 2023-03-27 DIAGNOSIS — F5102 Adjustment insomnia: Secondary | ICD-10-CM | POA: Diagnosis not present

## 2023-03-27 DIAGNOSIS — F32A Depression, unspecified: Secondary | ICD-10-CM | POA: Diagnosis not present

## 2023-04-03 DIAGNOSIS — K625 Hemorrhage of anus and rectum: Secondary | ICD-10-CM | POA: Diagnosis not present

## 2023-04-03 DIAGNOSIS — B3781 Candidal esophagitis: Secondary | ICD-10-CM | POA: Diagnosis not present

## 2023-04-03 DIAGNOSIS — K2100 Gastro-esophageal reflux disease with esophagitis, without bleeding: Secondary | ICD-10-CM | POA: Diagnosis not present

## 2023-04-18 DIAGNOSIS — G4719 Other hypersomnia: Secondary | ICD-10-CM | POA: Diagnosis not present

## 2023-04-18 DIAGNOSIS — M329 Systemic lupus erythematosus, unspecified: Secondary | ICD-10-CM | POA: Diagnosis not present

## 2023-04-18 DIAGNOSIS — I272 Pulmonary hypertension, unspecified: Secondary | ICD-10-CM | POA: Diagnosis not present

## 2023-04-24 DIAGNOSIS — I10 Essential (primary) hypertension: Secondary | ICD-10-CM | POA: Diagnosis not present

## 2023-04-24 DIAGNOSIS — R7303 Prediabetes: Secondary | ICD-10-CM | POA: Diagnosis not present

## 2023-04-24 DIAGNOSIS — Z79899 Other long term (current) drug therapy: Secondary | ICD-10-CM | POA: Diagnosis not present

## 2023-04-24 DIAGNOSIS — F32A Depression, unspecified: Secondary | ICD-10-CM | POA: Diagnosis not present

## 2023-04-24 DIAGNOSIS — M329 Systemic lupus erythematosus, unspecified: Secondary | ICD-10-CM | POA: Diagnosis not present

## 2023-04-24 DIAGNOSIS — R7989 Other specified abnormal findings of blood chemistry: Secondary | ICD-10-CM | POA: Diagnosis not present

## 2023-04-25 DIAGNOSIS — R231 Pallor: Secondary | ICD-10-CM | POA: Diagnosis not present

## 2023-04-25 DIAGNOSIS — L738 Other specified follicular disorders: Secondary | ICD-10-CM | POA: Diagnosis not present

## 2023-04-25 DIAGNOSIS — M329 Systemic lupus erythematosus, unspecified: Secondary | ICD-10-CM | POA: Diagnosis not present

## 2023-05-04 DIAGNOSIS — E559 Vitamin D deficiency, unspecified: Secondary | ICD-10-CM | POA: Diagnosis not present

## 2023-05-04 DIAGNOSIS — Z7952 Long term (current) use of systemic steroids: Secondary | ICD-10-CM | POA: Diagnosis not present

## 2023-05-04 DIAGNOSIS — M329 Systemic lupus erythematosus, unspecified: Secondary | ICD-10-CM | POA: Diagnosis not present

## 2023-05-04 DIAGNOSIS — Z79624 Long term (current) use of inhibitors of nucleotide synthesis: Secondary | ICD-10-CM | POA: Diagnosis not present

## 2023-05-04 DIAGNOSIS — Z79899 Other long term (current) drug therapy: Secondary | ICD-10-CM | POA: Diagnosis not present

## 2023-05-04 DIAGNOSIS — G7249 Other inflammatory and immune myopathies, not elsewhere classified: Secondary | ICD-10-CM | POA: Diagnosis not present

## 2023-05-04 DIAGNOSIS — M609 Myositis, unspecified: Secondary | ICD-10-CM | POA: Diagnosis not present

## 2023-05-04 DIAGNOSIS — M351 Other overlap syndromes: Secondary | ICD-10-CM | POA: Diagnosis not present

## 2023-05-24 DIAGNOSIS — G894 Chronic pain syndrome: Secondary | ICD-10-CM | POA: Diagnosis not present

## 2023-05-24 DIAGNOSIS — B370 Candidal stomatitis: Secondary | ICD-10-CM | POA: Diagnosis not present

## 2023-05-24 DIAGNOSIS — I10 Essential (primary) hypertension: Secondary | ICD-10-CM | POA: Diagnosis not present

## 2023-05-24 DIAGNOSIS — R5383 Other fatigue: Secondary | ICD-10-CM | POA: Diagnosis not present

## 2023-05-31 DIAGNOSIS — F4322 Adjustment disorder with anxiety: Secondary | ICD-10-CM | POA: Diagnosis not present

## 2023-06-14 DIAGNOSIS — F4322 Adjustment disorder with anxiety: Secondary | ICD-10-CM | POA: Diagnosis not present

## 2023-06-28 DIAGNOSIS — Z1231 Encounter for screening mammogram for malignant neoplasm of breast: Secondary | ICD-10-CM | POA: Diagnosis not present

## 2023-06-28 DIAGNOSIS — R739 Hyperglycemia, unspecified: Secondary | ICD-10-CM | POA: Diagnosis not present

## 2023-06-28 DIAGNOSIS — Z136 Encounter for screening for cardiovascular disorders: Secondary | ICD-10-CM | POA: Diagnosis not present

## 2023-06-28 DIAGNOSIS — Z0000 Encounter for general adult medical examination without abnormal findings: Secondary | ICD-10-CM | POA: Diagnosis not present

## 2023-06-29 DIAGNOSIS — F4322 Adjustment disorder with anxiety: Secondary | ICD-10-CM | POA: Diagnosis not present

## 2023-06-30 DIAGNOSIS — M545 Low back pain, unspecified: Secondary | ICD-10-CM | POA: Diagnosis not present

## 2023-06-30 DIAGNOSIS — M48061 Spinal stenosis, lumbar region without neurogenic claudication: Secondary | ICD-10-CM | POA: Diagnosis not present

## 2023-06-30 DIAGNOSIS — M5126 Other intervertebral disc displacement, lumbar region: Secondary | ICD-10-CM | POA: Diagnosis not present

## 2023-07-18 ENCOUNTER — Telehealth: Payer: BC Managed Care – PPO | Admitting: Adult Health

## 2023-07-18 ENCOUNTER — Telehealth: Payer: Self-pay

## 2023-07-18 NOTE — Telephone Encounter (Signed)
I tried to call the patient back to discuss and reschedule. I'm sorry, on our end it looks like she no showed her VV today. Mychart should text a reminder but its our understanding a link won't be sent. The patient has to log into mychart to join the video visit. I was unable to LVM today so I sent a mychart message.

## 2023-07-18 NOTE — Telephone Encounter (Signed)
Patient left a voicemail with our office this afternoon reporting that she had a virtual visit appointment today.  However, she was never sent the link to the appointment.  She reports that she called our office but it rang for over 10 minutes and she was unable to speak with anybody.  She is asking for a call back to discuss.

## 2023-07-20 ENCOUNTER — Encounter: Payer: Self-pay | Admitting: *Deleted

## 2023-07-20 NOTE — Telephone Encounter (Signed)
I tried to call the patient again but her VM was full. I have sent her a letter as well.

## 2023-07-29 DIAGNOSIS — F4322 Adjustment disorder with anxiety: Secondary | ICD-10-CM | POA: Diagnosis not present

## 2023-09-12 DIAGNOSIS — Z0271 Encounter for disability determination: Secondary | ICD-10-CM

## 2023-10-02 ENCOUNTER — Other Ambulatory Visit: Payer: Self-pay | Admitting: Neurology

## 2023-10-02 DIAGNOSIS — G43711 Chronic migraine without aura, intractable, with status migrainosus: Secondary | ICD-10-CM

## 2023-10-06 ENCOUNTER — Other Ambulatory Visit: Payer: Self-pay | Admitting: Neurology

## 2023-10-06 DIAGNOSIS — G43109 Migraine with aura, not intractable, without status migrainosus: Secondary | ICD-10-CM

## 2023-10-09 NOTE — Telephone Encounter (Signed)
 Rx refilled per last appointment note.

## 2023-10-30 ENCOUNTER — Encounter: Payer: Self-pay | Admitting: Hematology & Oncology

## 2023-10-30 ENCOUNTER — Other Ambulatory Visit (HOSPITAL_COMMUNITY): Payer: Self-pay

## 2023-10-30 ENCOUNTER — Telehealth: Payer: Self-pay

## 2023-10-30 NOTE — Telephone Encounter (Signed)
 Pharmacy Patient Advocate Encounter   Received notification from CoverMyMeds that prior authorization for Ubrelvy 100mg  Tablet is due for renewal.   Insurance verification completed.   The patient is insured through [facility].  Action: Patient hasn't been seen in your office in over a year. Plan requires updated chart notes for PA renewal.

## 2023-10-30 NOTE — Telephone Encounter (Signed)
 Hold on this for now. Patient missed an appointment back in December and we tried to reach her multiple times by phone and a letter but haven't heard back from her.

## 2023-11-08 ENCOUNTER — Other Ambulatory Visit: Payer: Self-pay | Admitting: Neurology

## 2023-11-08 DIAGNOSIS — G43711 Chronic migraine without aura, intractable, with status migrainosus: Secondary | ICD-10-CM

## 2023-12-28 ENCOUNTER — Other Ambulatory Visit: Payer: Self-pay | Admitting: Neurology

## 2023-12-28 DIAGNOSIS — G43711 Chronic migraine without aura, intractable, with status migrainosus: Secondary | ICD-10-CM

## 2023-12-28 NOTE — Telephone Encounter (Signed)
 Last seen on 08/22/22 No follow up scheduled

## 2024-06-27 NOTE — Telephone Encounter (Signed)
 Noted.  I will place a case request if she agrees to undergo the RHC.

## 2024-07-15 ENCOUNTER — Other Ambulatory Visit: Payer: Self-pay

## 2024-07-15 ENCOUNTER — Emergency Department (HOSPITAL_BASED_OUTPATIENT_CLINIC_OR_DEPARTMENT_OTHER)
Admission: EM | Admit: 2024-07-15 | Discharge: 2024-07-15 | Disposition: A | Discharge status: Other Acute Inpatient Hospital | Attending: Emergency Medicine | Admitting: Emergency Medicine

## 2024-07-15 ENCOUNTER — Encounter (HOSPITAL_BASED_OUTPATIENT_CLINIC_OR_DEPARTMENT_OTHER): Payer: Self-pay | Admitting: Emergency Medicine

## 2024-07-15 ENCOUNTER — Encounter: Payer: Self-pay | Admitting: Hematology & Oncology

## 2024-07-15 ENCOUNTER — Emergency Department (HOSPITAL_BASED_OUTPATIENT_CLINIC_OR_DEPARTMENT_OTHER)

## 2024-07-15 DIAGNOSIS — I10 Essential (primary) hypertension: Secondary | ICD-10-CM | POA: Diagnosis not present

## 2024-07-15 DIAGNOSIS — Z743 Need for continuous supervision: Secondary | ICD-10-CM | POA: Diagnosis not present

## 2024-07-15 DIAGNOSIS — D649 Anemia, unspecified: Secondary | ICD-10-CM | POA: Diagnosis not present

## 2024-07-15 DIAGNOSIS — R0602 Shortness of breath: Secondary | ICD-10-CM

## 2024-07-15 DIAGNOSIS — R7989 Other specified abnormal findings of blood chemistry: Secondary | ICD-10-CM

## 2024-07-15 HISTORY — DX: Systemic lupus erythematosus, unspecified: M32.9

## 2024-07-15 LAB — CBC
HCT: 24.9 % — ABNORMAL LOW (ref 36.0–46.0)
Hemoglobin: 7.6 g/dL — ABNORMAL LOW (ref 12.0–15.0)
MCH: 24.7 pg — ABNORMAL LOW (ref 26.0–34.0)
MCHC: 30.5 g/dL (ref 30.0–36.0)
MCV: 80.8 fL (ref 80.0–100.0)
Platelets: 325 K/uL (ref 150–400)
RBC: 3.08 MIL/uL — ABNORMAL LOW (ref 3.87–5.11)
RDW: 18.1 % — ABNORMAL HIGH (ref 11.5–15.5)
WBC: 4.8 K/uL (ref 4.0–10.5)
nRBC: 0 % (ref 0.0–0.2)

## 2024-07-15 LAB — COMPREHENSIVE METABOLIC PANEL WITH GFR
ALT: 7 U/L (ref 0–44)
AST: 30 U/L (ref 15–41)
Albumin: 3.4 g/dL — ABNORMAL LOW (ref 3.5–5.0)
Alkaline Phosphatase: 119 U/L (ref 38–126)
Anion gap: 12 (ref 5–15)
BUN: 8 mg/dL (ref 6–20)
CO2: 26 mmol/L (ref 22–32)
Calcium: 8.4 mg/dL — ABNORMAL LOW (ref 8.9–10.3)
Chloride: 100 mmol/L (ref 98–111)
Creatinine, Ser: 0.7 mg/dL (ref 0.44–1.00)
GFR, Estimated: >60 mL/min (ref >60)
Glucose, Bld: 127 mg/dL — ABNORMAL HIGH (ref 70–99)
Potassium: 2.4 mmol/L — CL (ref 3.5–5.1)
Sodium: 138 mmol/L (ref 135–145)
Total Bilirubin: 0.4 mg/dL (ref 0.0–1.2)
Total Protein: 8 g/dL (ref 6.5–8.1)

## 2024-07-15 LAB — URINALYSIS, ROUTINE W REFLEX MICROSCOPIC
Bilirubin Urine: NEGATIVE
Glucose, UA: NEGATIVE mg/dL
Hgb urine dipstick: NEGATIVE
Ketones, ur: NEGATIVE mg/dL
Nitrite: NEGATIVE
Protein, ur: >=300 mg/dL — AB
Specific Gravity, Urine: 1.025 (ref 1.005–1.030)
pH: 7 (ref 5.0–8.0)

## 2024-07-15 LAB — TROPONIN T, HIGH SENSITIVITY
Troponin T High Sensitivity: 98 ng/L — ABNORMAL HIGH (ref 0–19)
Troponin T High Sensitivity: 101 ng/L (ref 0–19)

## 2024-07-15 LAB — URINALYSIS, MICROSCOPIC (REFLEX)

## 2024-07-15 LAB — MAGNESIUM: Magnesium: 2 mg/dL (ref 1.7–2.4)

## 2024-07-15 LAB — PREGNANCY, URINE: Preg Test, Ur: NEGATIVE

## 2024-07-15 MED ORDER — SODIUM CHLORIDE 0.9 % IV BOLUS
500.0000 mL | Freq: Once | INTRAVENOUS | Status: AC
  Administered 2024-07-15: 500 mL via INTRAVENOUS

## 2024-07-15 MED ORDER — POTASSIUM CHLORIDE 10 MEQ/100ML IV SOLN
10.0000 meq | INTRAVENOUS | Status: DC
  Administered 2024-07-15 (×2): 10 meq via INTRAVENOUS
  Filled 2024-07-15 (×2): qty 100

## 2024-07-15 NOTE — ED Provider Notes (Signed)
 [place] [facility] AT [facility] Provider Note   CSN: [id number] Arrival date & time: 07/15/24  1904     Patient presents with: Weakness and Shortness of Breath   [name] is a 43 y.o. female.   The history is provided by the patient, medical records and the spouse. No language interpreter was used.  Weakness Severity:  Moderate Onset quality:  Unable to specify Duration:  2 weeks Timing:  Constant Progression:  Waxing and waning Chronicity:  New Relieved by:  Nothing Worsened by:  Nothing Associated symptoms: shortness of breath   Associated symptoms: no abdominal pain, no chest pain, no cough, no diarrhea, no dizziness, no dysuria, no numbness in extremities, no falls, no fever, no foul-smelling urine, no frequency, no headaches, no lethargy, no loss of consciousness, no nausea, no near-syncope and no vomiting   Shortness of Breath Associated symptoms: no abdominal pain, no chest pain, no cough, no fever, no headaches, no neck pain, no rash and no vomiting        Prior to Admission medications   Medication Sig Start Date End Date Taking? Authorizing Provider  albuterol  (VENTOLIN  HFA) 108 (90 Base) MCG/ACT inhaler Inhale 1-2 puffs into the lungs every 6 (six) hours as needed for wheezing or shortness of breath. 11/05/19   Anitra Rocky KIDD, PA-C  diphenhydrAMINE  (BENADRYL ) 25 mg capsule Take 25 mg by mouth every 6 (six) hours as needed.    [provider]  diphenhydrAMINE -APAP, sleep, (TYLENOL  PM EXTRA STRENGTH PO) Take by mouth.    [provider]  famciclovir  (FAMVIR ) 500 MG tablet Take 1 tablet (500 mg total) by mouth 2 (two) times daily. 04/20/22   Fleeta Kathie Jomarie LOISE, MD  fexofenadine  (ALLEGRA  ALLERGY) 180 MG tablet Take 1 tablet (180 mg total) by mouth 2 (two) times a [name]. 11/23/18   Luke Orlan HERO, DO  Fremanezumab -vfrm (AJOVY ) 225 MG/1.5ML SOAJ Inject 225 mg into the skin every 30 (thirty) days. 08/22/22   Ines Onetha NOVAK, MD   hydroxychloroquine (PLAQUENIL) 200 MG tablet Take 200 mg by mouth daily. 01/10/22   [provider]  ibuprofen  (ADVIL ,MOTRIN ) 200 MG tablet Take 800 mg by mouth every 6 (six) hours as needed for moderate pain.    [provider]  levocetirizine (XYZAL) 5 MG tablet Take 5 mg by mouth every evening.    [provider]  meloxicam (MOBIC) 15 MG tablet Take 15 mg by mouth daily as needed. 07/16/19   [provider]  predniSONE  (DELTASONE ) 10 MG tablet Take by mouth. 03/02/22   [provider]  rizatriptan  (MAXALT -MLT) 10 MG disintegrating tablet DISSOLVE 1 TABLET BY MOUTH AS NEEDED FOR MIGRAINE. MAY REPEAT IN 2 HOURS IF NEEDED 11/08/23   Ines Onetha NOVAK, MD  SPRINTEC 28 0.25-35 MG-MCG tablet Take 1 tablet by mouth daily. 07/10/19   [provider]  Ubrogepant  (UBRELVY ) 100 MG TABS TAKE ONE TABLET BY MOUTH EVERY TWO (2) HOURS AS NEEDED. MAXIMUM 200MG  A [name]. 10/09/23   Ines Onetha NOVAK, MD    Allergies: Citric acid , Latex, and Penicillins cross reactors    Review of Systems  Constitutional:  Positive for fatigue. Negative for chills and fever.  Respiratory:  Positive for shortness of breath. Negative for cough.   Cardiovascular:  Negative for chest pain, palpitations and near-syncope.  Gastrointestinal:  Positive for blood in stool (darker stools per pt). Negative for abdominal pain, constipation, diarrhea, nausea and vomiting.  Genitourinary:  Negative for dysuria and frequency.  Musculoskeletal:  Negative for back pain, falls, neck pain and neck stiffness.  Skin:  Negative for rash and wound.  Neurological:  Positive for weakness. Negative for dizziness, loss of consciousness, light-headedness and headaches.  Psychiatric/Behavioral:  Negative for agitation and confusion.   All other systems reviewed and are negative.   Updated Vital Signs BP (!) 163/99 (BP Location: Right Arm)   Pulse 83   Temp 98 F (36.7 C)   Resp 20   Ht 5' 7 (1.702 m)    Wt 71.7 kg   LMP 06/18/2024 (Approximate)   SpO2 98%   BMI 24.75 kg/m   Physical Exam Vitals and nursing note reviewed.  Constitutional:      General: She is not in acute distress.    Appearance: She is well-developed. She is not ill-appearing, toxic-appearing or diaphoretic.  HENT:     Head: Normocephalic and atraumatic.     Mouth/Throat:     Mouth: Mucous membranes are moist.     Pharynx: No oropharyngeal exudate or posterior oropharyngeal erythema.  Eyes:     Extraocular Movements: Extraocular movements intact.     Conjunctiva/sclera: Conjunctivae normal.     Pupils: Pupils are equal, round, and reactive to light.  Cardiovascular:     Rate and Rhythm: Normal rate and regular rhythm.     Pulses: Normal pulses.     Heart sounds: No murmur heard. Pulmonary:     Effort: Pulmonary effort is normal. No respiratory distress.     Breath sounds: Normal breath sounds. No wheezing, rhonchi or rales.  Chest:     Chest wall: No tenderness.  Abdominal:     Palpations: Abdomen is soft.     Tenderness: There is no abdominal tenderness. There is no guarding or rebound.  Genitourinary:    Comments: Patient refused rectal exam. Musculoskeletal:        General: No swelling.     Cervical back: Neck supple.     Right lower leg: No edema.     Left lower leg: No edema.  Skin:    General: Skin is warm and dry.     Capillary Refill: Capillary refill takes less than 2 seconds.     Coloration: Skin is pale.     Findings: No erythema.  Neurological:     General: No focal deficit present.     Mental Status: She is alert.  Psychiatric:        Mood and Affect: Mood normal.     (all labs ordered are listed, but only abnormal results are displayed) Labs Reviewed  COMPREHENSIVE METABOLIC PANEL WITH GFR - Abnormal; Notable for the following components:      Result Value   Potassium 2.4 (*)    Glucose, Bld 127 (*)    Calcium 8.4 (*)    Albumin 3.4 (*)    All other components within normal  limits  CBC - Abnormal; Notable for the following components:   RBC 3.08 (*)    Hemoglobin 7.6 (*)    HCT 24.9 (*)    MCH 24.7 (*)    RDW 18.1 (*)    All other components within normal limits  URINALYSIS, ROUTINE W REFLEX MICROSCOPIC - Abnormal; Notable for the following components:   Protein, ur >=300 (*)    Leukocytes,Ua SMALL (*)    All other components within normal limits  URINALYSIS, MICROSCOPIC (REFLEX) - Abnormal; Notable for the following components:   Bacteria, UA RARE (*)    All other components within normal  limits  TROPONIN T, HIGH SENSITIVITY - Abnormal; Notable for the following components:   Troponin T High Sensitivity 98 (*)    All other components within normal limits  PREGNANCY, URINE  MAGNESIUM  TROPONIN T, HIGH SENSITIVITY    EKG: EKG Interpretation Date/Time:  Monday July 15 2024 19:14:16 EST Ventricular Rate:  83 PR Interval:  153 QRS Duration:  83 QT Interval:  508 QTC Calculation: 597 R Axis:   -1  Text Interpretation: Sinus rhythm Nonspecific T abnrm, anterolateral leads Prolonged QT interval when compared to prior, longer QTC no STEMI Confirmed by Ginger Barefoot ([id number]) on 07/15/2024 8:05:51 PM  Radiology: DG Chest Portable 1 View Result Date: 07/15/2024 EXAM: 1 VIEW(S) XRAY OF THE CHEST 07/15/2024 09:01:00 PM COMPARISON: None available. CLINICAL HISTORY: SOB, symptomatic anemia FINDINGS: LUNGS AND PLEURA: No focal pulmonary opacity. No pleural effusion. No pneumothorax. HEART AND MEDIASTINUM: No acute abnormality of the cardiac and mediastinal silhouettes. BONES AND SOFT TISSUES: No acute osseous abnormality. IMPRESSION: 1. No acute process. Electronically signed by: Franky Crease MD 07/15/2024 09:07 PM EST RP Workstation: [id number]     Procedures   CRITICAL CARE Performed by: Lonni PARAS Jaquese Irving Total critical care time: 30 minutes Critical care time was exclusive of separately billable procedures and treating other patients. Critical  care was necessary to treat or prevent imminent or life-threatening deterioration. Critical care was time spent personally by me on the following activities: development of treatment plan with patient and/or surrogate as well as nursing, discussions with consultants, evaluation of patient's response to treatment, examination of patient, obtaining history from patient or surrogate, ordering and performing treatments and interventions, ordering and review of laboratory studies, ordering and review of radiographic studies, pulse oximetry and re-evaluation of patient's condition.   Medications Ordered in the ED  potassium chloride  10 mEq in 100 mL IVPB (10 mEq Intravenous New Bag/Given 07/15/24 2238)  sodium chloride  0.9 % bolus 500 mL (500 mLs Intravenous New Bag/Given 07/15/24 2111)                                    Medical Decision Making Amount and/or Complexity of Data Reviewed Labs: ordered. Radiology: ordered.  Risk Prescription drug management.    ADRIJANA HAROS is a 43 y.o. female with a past medical history significant for migraines, lupus, previous iron deficiency anemia, and previous menometrorrhagia who presents at the direction of PCP for further evaluation and management of hypokalemia and anemia.  Patient reports that for the last few weeks she has had more exertional shortness of breath, fatigue, generalized weakness, and malaise.  She denies fevers, chills, or cough.  She denies any trauma.  She denies any falls.  She reports that she has had some rectal bleeding on and off in the past but has not noticed any frank blood recently but does state that her stool was darker recently.  She reports no nausea or vomiting.  Denies chest pain.  She denies any recent medication changes.  She reports that she has been ill previously with lupus flares but says this feels different.  According to patient, she saw her PCP today who did blood work and called her to tell her that her  hemoglobin was low at 8.3 and her potassium was low at 2.8.   More recently in our system patient's potassium was 3.3 several years ago and her hemoglobin was 11.8 previously.  On my exam, lungs  clear.  Chest nontender.  Abdomen nontender.  Good bowel sounds.  Patient is pale and her mucous membranes.  She is not tachycardic or tachypneic but feels fatigued.  She is afebrile.  Oxygen saturations are normal.  Patient did not want me to to perform a rectal exam to look for fecal occult blood.  Patient had some screening lab in triage and both of her concerning values have dropped even today.  Her potassium is now 2.4 and her CBC is now 7.6.  Both have dropped throughout the [name].  Given this precipitous drop, she will need admission for symptomatic anemia causing the fatigue lightheadedness and shortness of breath.  Will add on cardiac workup with her shortness of breath and a chest x-ray.  Unfortunately, at this facility which is a stand-alone emergency department, we do not have the ability to give crossmatch blood but we can give emergent blood if she is an extremis which she does not appear to be at this time.  We had a discussion about the admission and she would like to go to [facility] where she has had her previous care.  Will order the other labs but we will go ahead and call [facility] to discuss admission and ideally ED to ED transfer so she can get blood transfusion as soon as possible as her numbers are dropping.  Will give IV potassium while she is here while we wait for the labs to return.  As she has no abdominal tenderness or pain we will hold on CT imaging at this time.  Will wait for [facility] to call for admission.  10:19 PM Just spoke to the [facility] transfer line and patient has been accepted to be admitted to [facility] under the hospitalist care of Dr. Marsa Speaks.  With the patient needing blood, they will check to see if there is an available bed  in which case she can go directly to her room however if it is going to take an extended amount of time, they will let me know to help facilitate ED to ED transfer so that she can get blood sooner.  Will await their callback and admission.   Spoke to hospitalist team at [facility] with Dr. Speaks who agrees with plan for direct admission.  They say they have a bed and patient does not need ED to ED transfer and she can get blood when she arrives.  Patient will be transferred and admitted for further management of symptomatic anemia.  Of note, troponin was elevated, they will trend.  Medicine team aware of this finding.       Final diagnoses:  Symptomatic anemia  Elevated troponin  Shortness of breath     Clinical Impression: 1. Symptomatic anemia   2. Elevated troponin   3. Shortness of breath     Disposition: Admit and transfer to [facility] for further management of symptomatic anemia with shortness of breath lightheadedness and positive troponin.  Patient admitted to hospitalist service of Dr. Speaks.   This note was prepared with assistance of Conservation officer, historic buildings. Occasional wrong-word or sound-a-like substitutions may have occurred due to the inherent limitations of voice recognition software.       Siyah Mault, Lonni PARAS, MD 07/15/24 2303

## 2024-07-15 NOTE — Telephone Encounter (Signed)
 Copied from CRM #[id number]. Topic: Clinical Concerns - Lab Results - Additional Questions >> Jul 15, 2024  4:48 PM Rollen DEL, CNA wrote: Juliene Meeter [facility] is calling other request    Include all details related to the request(s) below:  Adam with [facility] called to report a critical lab for the patient, requesting a call back.    Confirm and type the Best Contact Number below:  Patient/caller contact number:    [phone]         [] Home  [] Mobile  [x] Work [] Other   [] Okay to leave a voicemail   Medication List:  Current Outpatient Medications:  .  amitriptyline  (ELAVIL ) 50 mg tablet, Take 1 tablet (50 mg total) by mouth nightly., Disp: 30 tablet, Rfl: 1 .  carvediloL (COREG) 25 mg tablet, TAKE 1 TABLET(25 MG) BY MOUTH IN THE MORNING AND 1 TABLET IN THE EVENING. TAKE WITH MEALS, Disp: 180 tablet, Rfl: 0 .  celecoxib (CeleBREX) 200 mg capsule, TAKE 1 CAPSULE(200 MG) BY MOUTH DAILY, Disp: 90 capsule, Rfl: 0 .  DULoxetine (CYMBALTA) 60 mg capsule, TAKE 1 CAPSULE BY MOUTH TWICE DAILY, Disp: 60 capsule, Rfl: 3 .  ergocalciferol (VITAMIN D2) 1,250 mcg (50,000 unit) capsule, TAKE 1 CAPSULE BY MOUTH EVERY 7 DAYS, Disp: 4 capsule, Rfl: 0 .  facial mask misc, . Provide PAP supplies (expires 12 months from 04/04/2024) ., Disp: , Rfl:  .  fexofenadine  (ALLEGRA ) 180 mg tablet, Take 180 mg by mouth daily as needed., Disp: , Rfl:  .  gabapentin (NEURONTIN) 400 mg capsule, TAKE 1 CAPSULE BY MOUTH 3 TIMES DAILY, Disp: 90 capsule, Rfl: 1 .  hydrocortisone  2.5 % cream, Apply topically 2 (two) times a day. As needed for rashes or inflamed lesions on the face., Disp: 28 g, Rfl: 6 .  Lo Loestrin Fe 1 mg-10 mcg (24)/10 mcg (2) tab, Take 1 tablet by mouth daily., Disp: , Rfl:  .  losartan (COZAAR) 50 mg tablet, TAKE 1 TABLET(50 MG) BY MOUTH DAILY, Disp: 90 tablet, Rfl: 1 .  methocarbamoL (ROBAXIN) 500 mg tablet, TAKE 1 TABLET(500 MG) BY MOUTH TWICE DAILY AS NEEDED FOR MUSCLE SPASMS, Disp: 60  tablet, Rfl: 0 .  mycophenolate (MYFORTIC) 360 mg TbEC DR tablet, Take 2 tablets (720 mg total) by mouth 2 (two) times a day., Disp: 360 tablet, Rfl: 0 .  nystatin (MYCOSTATIN) 100,000 unit/mL suspension, Take 5 mL (500,000 Units total) by mouth 4 (four) times a day., Disp: 140 mL, Rfl: 1 .  temazepam (RESTORIL) 30 mg capsule, TAKE 1 CAPSULE(30 MG) BY MOUTH EVERY NIGHT AS NEEDED FOR SLEEP, Disp: 30 capsule, Rfl: 1 .  triamcinolone  acetonide (KENALOG) 0.1 % cream, Apply topically 2 (two) times a day. As needed for rashes or inflamed bumps on the body., Disp: 454 g, Rfl: 4 .  valACYclovir  (VALTREX ) 500 mg tablet, Take 1 tablet (500 mg total) by mouth 2 (two) times a day., Disp: 60 tablet, Rfl: 2     Medication Request/Refills: Pharmacy Information (if applicable)   [x] Not Applicable       []  Pharmacy listed  Send Medication Request to:                                                 [] Pharmacy not listed (added to pharmacy list in Epic) Send Medication Request to:  Listed Pharmacies: [id number] - [place], [place] - [address] AT . - PHONE: [phone] - FAX: [phone] [facility] #[id number] - [place], [place] - [address] AT [facility] [address] - PHONE: [phone] - FAX: [phone]

## 2024-07-15 NOTE — ED Triage Notes (Signed)
 Pt notified of critically low K+ and Hgb. Denies known source of bleeding.   C/o shob, weakness x 1.5 weeks. Good po intake, denies known sick contact.   Hx of Lupus.

## 2024-07-15 NOTE — ED Notes (Signed)
 Pt placed in a gown. Hooked up to the monitor with the 5 lead, BP cuff and pulse ox. Pt is aware she needs a urine sample.

## 2024-07-15 NOTE — Progress Notes (Signed)
 [facility]  - Family Medicine Myra Master  Date of Service: 07/15/2024 Patient Name: [name] Patient DOB: 1981/07/09    Subjective:   Generalized Body Aches, Headache, and Insomnia   HPI Patient comes in for Generalized Body Aches, Headache, and Insomnia  Patient here for body aches and headache and insomnia. She also has a dry and itchy throat from all the drainage that is thick and greenish. She has been feeling anxious and restless even when she tired. She can not get comfortable when she tried to lay down to sleep. Taking vitamins.  Appetite decreased.  Taking two amitriptylline at night to help sleep No pain med. Hx of meloxicam Pertinent ROS items are noted in HPI.  Constitutional symptoms: extreme fatigue and negative, no fever Eyes:  negative Ear, nose, throat:  headaches Cardiovascular: worried about having right heart cath.has probable pulmonary htn. Hx of PICC line and clot , discussed fears and to discuss risks/ benefits with cardiology Respiratory:  no cough or dyspnea. No wheezing Gastrointestinal:  no abd pain or n/v. No constipation Genitourinary:  denies any dysuria Skin:  negative Neurological:  negative Musculoskeletal:  hurts all over and can't sleep well. Achy. No swelling. No falls or injuries Psychiatric:  anxiety and negative. Not sleeping well. Can't get comfortable. Achy. Hx of anxiety worried about heart. Hx of depression. On cymbalta 120 mg. Sees counselor. Did get her disability approved.  Endocrine:  negative Hematological:  negative Allergic:  negative   The following portions of the patient's history were reviewed and updated as appropriate: allergies, current medications, PMH/PSH, past social history and problem list.   Past Medical/Surgical History:   Medical History[1] Surgical History[2]  Family History:   Family History[3]  Social History:   Social History[4] Tobacco Use History[5]   Allergies:    Citric acid  and Latex  Current Medications:   Current Medications[6]   Objective:   Vital Signs BP 114/67   Pulse 72   Temp 97.3 F (36.3 C) (Skin)   Ht 1.702 m (5' 7)   Wt 71.9 kg (158 lb 8 oz)   LMP 06/17/2024 (Approximate)   SpO2 99%   Breastfeeding No   BMI 24.82 kg/m   BP Readings from Last 3 Encounters:  07/15/24 114/67  04/25/24 (!) 155/96  04/09/24 114/77   Wt Readings from Last 3 Encounters:  07/15/24 71.9 kg (158 lb 8 oz)  04/29/24 74.8 kg (165 lb)  04/25/24 75 kg (165 lb 5.5 oz)   Patient's last menstrual period was 06/17/2024 (approximate).  Physical Exam  Constitutional.  Chronically ill appearing 43 y.o. female, well developed, well nourished, no acute distress. Neck: supple.  Respiratory.  Clear bilaterally, breathsounds equal, respirations unlabored. Cardiovascular.  Regular, nl S1, S2; 2/6 systolic murmur, no gallops or rubs.  No lower extremity edema, 2+ peripheral pulses. Neuro: alert, oriented x 3, CN 2-12 intact bil, no neuro deficits.  Skin: warm and dry Psych: cooperative, pleasant.   Assessment/Plan:    [name] was seen today for generalized body aches, headache and insomnia.  Diagnoses and all orders for this visit:  Fatigue, unspecified type -     Comprehensive Metabolic Panel -     Ferritin -     CBC with Differential -     Vitamin B12 -     TSH  Chronic pain syndrome -     Discontinue: celecoxib (CeleBREX) 200 mg capsule; Take 1 capsule (200 mg total) by mouth daily.  Low  vitamin D level -     Vitamin D, 25-Hydroxy  Insomnia, unspecified type  Screening for lipid disorders -     Lipid Panel    Patient verbalizes understanding and in agreement with the above plan. All questions answered.    Medication side effects discussed with patient. Advised patient to call clinic or return for visit if these symptoms occur.   Goals of care discussed with patient including med compliance and adequate follow up.  Return  for as scheduled for cpe. did lipids today with labs.    This document serves as a record of services personally performed by Santana Molt, FN.  It was created on their behalf by Seldon GORMAN Ann, CMA, a trained medical scribe, and Certified Medical Assistant (CMA). During the course of documenting the history, physical exam and medical decision making, I was functioning as a stage manager. The creation of this record is the provider's dictation and/or activities during the visit.  Electronically signed by Seldon GORMAN Ann, CMA 07/15/2024 8:24 AM    This document was created using the aid of voice recognition Dragon dictation software.   Santana Tarry Molt, FNP       [1] Past Medical History: Diagnosis Date  . HTN (hypertension)   . Migraine   . Mollaret's meningitis (CMD)   . SLE (systemic lupus erythematosus)    (CMD)   [2] Past Surgical History: Procedure Laterality Date  . CESAREAN SECTION, UNSPECIFIED  2014   Procedure: CESAREAN SECTION  [3] Family History Problem Relation Name Age of Onset  . Cirrhosis Father    . Dementia Maternal Grandfather    . Hashimoto's thyroiditis Other    [4] Social History Socioeconomic History  . Marital status: Married  Tobacco Use  . Smoking status: Never  . Smokeless tobacco: Never  Substance and Sexual Activity  . Alcohol use: Not Currently  . Drug use: Never   Social Drivers of Health   Living Situation: Low Risk (11/13/2023)   Living Situation   . What is your living situation today?: I have a steady place to live   . Think about the place you live. Do you have problems with any of the following? Choose all that apply:: None/None on this list  Food Insecurity: Low Risk (11/13/2023)   Food vital sign   . Within the past 12 months, you worried that your food would run out before you got money to buy more: Never true   . Within the past 12 months, the food you bought just didn't last and you didn't have money to get more: Never true   Transportation Needs: No Transportation Needs (11/13/2023)   Transportation   . In the past 12 months, has lack of reliable transportation kept you from medical appointments, meetings, work or from getting things needed for daily living? : No  Utilities: Low Risk (11/13/2023)   Utilities   . In the past 12 months has the electric, gas, oil, or water company threatened to shut off services in your home? : No  Safety: Low Risk (04/02/2024)   Safety   . How often does anyone, including family and friends, physically hurt you?: Never   . How often does anyone, including family and friends, insult or talk down to you?: Never   . How often does anyone, including family and friends, threaten you with harm?: Never   . How often does anyone, including family and friends, scream or curse at you?: Never  Tobacco Use: Low Risk (  07/15/2024)   Patient History   . Smoking Tobacco Use: Never   . Smokeless Tobacco Use: Never  Depression: At Risk (07/15/2024)   PHQ-2   . PHQ-2 Score: 3  [5] Social History Tobacco Use  Smoking Status Never  Smokeless Tobacco Never  [6] Current Outpatient Medications  Medication Sig Dispense Refill  . amitriptyline  (ELAVIL ) 50 mg tablet Take 1 tablet (50 mg total) by mouth nightly. 30 tablet 1  . carvediloL (COREG) 25 mg tablet TAKE 1 TABLET(25 MG) BY MOUTH IN THE MORNING AND 1 TABLET IN THE EVENING. TAKE WITH MEALS 180 tablet 0  . DULoxetine (CYMBALTA) 60 mg capsule TAKE 1 CAPSULE BY MOUTH TWICE DAILY 60 capsule 3  . ergocalciferol (VITAMIN D2) 1,250 mcg (50,000 unit) capsule TAKE 1 CAPSULE BY MOUTH EVERY 7 DAYS 4 capsule 0  . facial mask misc . Provide PAP supplies (expires 12 months from 04/04/2024) .    . fexofenadine  (ALLEGRA ) 180 mg tablet Take 180 mg by mouth daily as needed.    . gabapentin (NEURONTIN) 400 mg capsule TAKE 1 CAPSULE BY MOUTH 3 TIMES DAILY 90 capsule 1  . hydrocortisone  2.5 % cream Apply topically 2 (two) times a day. As needed for rashes or inflamed  lesions on the face. 28 g 6  . Lo Loestrin Fe 1 mg-10 mcg (24)/10 mcg (2) tab Take 1 tablet by mouth daily.    SABRA losartan (COZAAR) 50 mg tablet TAKE 1 TABLET(50 MG) BY MOUTH DAILY 90 tablet 1  . methocarbamoL (ROBAXIN) 500 mg tablet TAKE 1 TABLET(500 MG) BY MOUTH TWICE DAILY AS NEEDED FOR MUSCLE SPASMS 60 tablet 0  . mycophenolate (MYFORTIC) 360 mg TbEC DR tablet Take 2 tablets (720 mg total) by mouth 2 (two) times a day. 360 tablet 0  . nystatin (MYCOSTATIN) 100,000 unit/mL suspension Take 5 mL (500,000 Units total) by mouth 4 (four) times a day. 140 mL 1  . temazepam (RESTORIL) 30 mg capsule TAKE 1 CAPSULE(30 MG) BY MOUTH EVERY NIGHT AS NEEDED FOR SLEEP 30 capsule 1  . triamcinolone  acetonide (KENALOG) 0.1 % cream Apply topically 2 (two) times a day. As needed for rashes or inflamed bumps on the body. 454 g 4  . valACYclovir  (VALTREX ) 500 mg tablet Take 1 tablet (500 mg total) by mouth 2 (two) times a day. 60 tablet 2  . celecoxib (CeleBREX) 200 mg capsule TAKE 1 CAPSULE(200 MG) BY MOUTH DAILY 90 capsule 0   No current facility-administered medications for this visit.
